# Patient Record
Sex: Male | Born: 1955 | Race: White | Hispanic: No | Smoking: Never smoker
Health system: Southern US, Community
[De-identification: ages and names within clinical notes are randomized; demographics above are authoritative.]

## PROBLEM LIST (undated history)

## (undated) DIAGNOSIS — E785 Hyperlipidemia, unspecified: Secondary | ICD-10-CM

## (undated) DIAGNOSIS — M1990 Unspecified osteoarthritis, unspecified site: Secondary | ICD-10-CM

## (undated) DIAGNOSIS — I639 Cerebral infarction, unspecified: Secondary | ICD-10-CM

## (undated) DIAGNOSIS — J449 Chronic obstructive pulmonary disease, unspecified: Secondary | ICD-10-CM

## (undated) DIAGNOSIS — D496 Neoplasm of unspecified behavior of brain: Secondary | ICD-10-CM

## (undated) DIAGNOSIS — F419 Anxiety disorder, unspecified: Secondary | ICD-10-CM

## (undated) DIAGNOSIS — E669 Obesity, unspecified: Secondary | ICD-10-CM

## (undated) DIAGNOSIS — R569 Unspecified convulsions: Secondary | ICD-10-CM

## (undated) DIAGNOSIS — Z5189 Encounter for other specified aftercare: Secondary | ICD-10-CM

## (undated) DIAGNOSIS — G4730 Sleep apnea, unspecified: Secondary | ICD-10-CM

## (undated) DIAGNOSIS — I2510 Atherosclerotic heart disease of native coronary artery without angina pectoris: Secondary | ICD-10-CM

## (undated) DIAGNOSIS — F32A Depression, unspecified: Secondary | ICD-10-CM

## (undated) HISTORY — DX: Cerebral infarction, unspecified: I63.9

## (undated) HISTORY — PX: BRAIN SURGERY: SHX531

## (undated) HISTORY — DX: Unspecified convulsions: R56.9

## (undated) HISTORY — DX: Hyperlipidemia, unspecified: E78.5

## (undated) HISTORY — DX: Depression, unspecified: F32.A

## (undated) HISTORY — DX: Chronic obstructive pulmonary disease, unspecified: J44.9

## (undated) HISTORY — DX: Encounter for other specified aftercare: Z51.89

## (undated) HISTORY — DX: Sleep apnea, unspecified: G47.30

## (undated) HISTORY — PX: SKIN LESION EXCISION: SHX2412

## (undated) HISTORY — PX: FOOT SURGERY: SHX648

## (undated) HISTORY — DX: Anxiety disorder, unspecified: F41.9

---

## 1998-03-17 ENCOUNTER — Emergency Department (HOSPITAL_COMMUNITY): Admission: EM | Admit: 1998-03-17 | Discharge: 1998-03-17 | Payer: Self-pay | Admitting: Emergency Medicine

## 1998-03-25 ENCOUNTER — Emergency Department (HOSPITAL_COMMUNITY): Admission: EM | Admit: 1998-03-25 | Discharge: 1998-03-25 | Payer: Self-pay | Admitting: Emergency Medicine

## 1998-05-28 ENCOUNTER — Emergency Department (HOSPITAL_COMMUNITY): Admission: EM | Admit: 1998-05-28 | Discharge: 1998-05-28 | Payer: Self-pay | Admitting: Emergency Medicine

## 1998-07-13 ENCOUNTER — Emergency Department (HOSPITAL_COMMUNITY): Admission: EM | Admit: 1998-07-13 | Discharge: 1998-07-13 | Payer: Self-pay | Admitting: Emergency Medicine

## 1998-07-29 ENCOUNTER — Emergency Department (HOSPITAL_COMMUNITY): Admission: EM | Admit: 1998-07-29 | Discharge: 1998-07-29 | Payer: Self-pay | Admitting: Emergency Medicine

## 1998-08-15 ENCOUNTER — Ambulatory Visit (HOSPITAL_COMMUNITY): Admission: RE | Admit: 1998-08-15 | Discharge: 1998-08-15 | Payer: Self-pay | Admitting: Internal Medicine

## 1998-08-15 ENCOUNTER — Encounter: Payer: Self-pay | Admitting: Internal Medicine

## 1998-08-28 ENCOUNTER — Ambulatory Visit (HOSPITAL_COMMUNITY): Admission: RE | Admit: 1998-08-28 | Discharge: 1998-08-28 | Payer: Self-pay | Admitting: Internal Medicine

## 1998-08-28 ENCOUNTER — Encounter: Payer: Self-pay | Admitting: Internal Medicine

## 1999-02-26 ENCOUNTER — Emergency Department (HOSPITAL_COMMUNITY): Admission: EM | Admit: 1999-02-26 | Discharge: 1999-02-26 | Payer: Self-pay

## 1999-11-26 ENCOUNTER — Emergency Department (HOSPITAL_COMMUNITY): Admission: EM | Admit: 1999-11-26 | Discharge: 1999-11-26 | Payer: Self-pay | Admitting: Emergency Medicine

## 1999-11-28 ENCOUNTER — Encounter: Payer: Self-pay | Admitting: General Surgery

## 1999-11-30 ENCOUNTER — Ambulatory Visit (HOSPITAL_COMMUNITY): Admission: RE | Admit: 1999-11-30 | Discharge: 1999-11-30 | Payer: Self-pay | Admitting: General Surgery

## 1999-12-17 ENCOUNTER — Emergency Department (HOSPITAL_COMMUNITY): Admission: EM | Admit: 1999-12-17 | Discharge: 1999-12-17 | Payer: Self-pay | Admitting: Emergency Medicine

## 1999-12-17 ENCOUNTER — Encounter: Payer: Self-pay | Admitting: Emergency Medicine

## 2000-01-12 ENCOUNTER — Emergency Department (HOSPITAL_COMMUNITY): Admission: EM | Admit: 2000-01-12 | Discharge: 2000-01-12 | Payer: Self-pay | Admitting: *Deleted

## 2000-01-12 ENCOUNTER — Encounter: Payer: Self-pay | Admitting: Emergency Medicine

## 2000-05-11 ENCOUNTER — Encounter: Payer: Self-pay | Admitting: Emergency Medicine

## 2000-05-11 ENCOUNTER — Emergency Department (HOSPITAL_COMMUNITY): Admission: EM | Admit: 2000-05-11 | Discharge: 2000-05-11 | Payer: Self-pay | Admitting: Emergency Medicine

## 2000-06-09 ENCOUNTER — Encounter: Payer: Self-pay | Admitting: Urology

## 2000-06-09 ENCOUNTER — Ambulatory Visit (HOSPITAL_COMMUNITY): Admission: RE | Admit: 2000-06-09 | Discharge: 2000-06-09 | Payer: Self-pay | Admitting: Urology

## 2000-07-14 ENCOUNTER — Emergency Department (HOSPITAL_COMMUNITY): Admission: EM | Admit: 2000-07-14 | Discharge: 2000-07-14 | Payer: Self-pay | Admitting: Emergency Medicine

## 2000-07-15 ENCOUNTER — Encounter: Payer: Self-pay | Admitting: Emergency Medicine

## 2000-12-29 ENCOUNTER — Emergency Department (HOSPITAL_COMMUNITY): Admission: EM | Admit: 2000-12-29 | Discharge: 2000-12-29 | Payer: Self-pay | Admitting: *Deleted

## 2002-02-24 ENCOUNTER — Emergency Department (HOSPITAL_COMMUNITY): Admission: EM | Admit: 2002-02-24 | Discharge: 2002-02-24 | Payer: Self-pay | Admitting: *Deleted

## 2003-04-27 ENCOUNTER — Emergency Department (HOSPITAL_COMMUNITY): Admission: EM | Admit: 2003-04-27 | Discharge: 2003-04-27 | Payer: Self-pay | Admitting: Emergency Medicine

## 2004-04-27 ENCOUNTER — Ambulatory Visit: Payer: Self-pay | Admitting: Family Medicine

## 2004-05-20 ENCOUNTER — Emergency Department (HOSPITAL_COMMUNITY): Admission: EM | Admit: 2004-05-20 | Discharge: 2004-05-20 | Payer: Self-pay | Admitting: Emergency Medicine

## 2004-05-27 ENCOUNTER — Emergency Department (HOSPITAL_COMMUNITY): Admission: EM | Admit: 2004-05-27 | Discharge: 2004-05-27 | Payer: Self-pay | Admitting: Emergency Medicine

## 2006-01-06 ENCOUNTER — Ambulatory Visit: Payer: Self-pay | Admitting: Nurse Practitioner

## 2006-01-07 ENCOUNTER — Ambulatory Visit (HOSPITAL_COMMUNITY): Admission: RE | Admit: 2006-01-07 | Discharge: 2006-01-07 | Payer: Self-pay | Admitting: Nurse Practitioner

## 2006-01-07 ENCOUNTER — Ambulatory Visit: Payer: Self-pay | Admitting: *Deleted

## 2006-01-22 ENCOUNTER — Ambulatory Visit: Payer: Self-pay | Admitting: Nurse Practitioner

## 2006-03-13 ENCOUNTER — Ambulatory Visit (HOSPITAL_COMMUNITY): Admission: RE | Admit: 2006-03-13 | Discharge: 2006-03-13 | Payer: Self-pay | Admitting: Nurse Practitioner

## 2006-03-26 ENCOUNTER — Ambulatory Visit: Payer: Self-pay | Admitting: Nurse Practitioner

## 2007-01-14 ENCOUNTER — Encounter (INDEPENDENT_AMBULATORY_CARE_PROVIDER_SITE_OTHER): Payer: Self-pay | Admitting: *Deleted

## 2007-10-07 ENCOUNTER — Encounter (INDEPENDENT_AMBULATORY_CARE_PROVIDER_SITE_OTHER): Payer: Self-pay | Admitting: Nurse Practitioner

## 2007-10-07 ENCOUNTER — Ambulatory Visit: Payer: Self-pay | Admitting: Internal Medicine

## 2007-10-07 LAB — CONVERTED CEMR LAB
ALT: 16 units/L (ref 0–53)
AST: 17 units/L (ref 0–37)
Albumin: 4.3 g/dL (ref 3.5–5.2)
Alkaline Phosphatase: 45 units/L (ref 39–117)
Basophils Absolute: 0 10*3/uL (ref 0.0–0.1)
Basophils Relative: 0 % (ref 0–1)
CO2: 22 meq/L (ref 19–32)
Calcium: 9.5 mg/dL (ref 8.4–10.5)
Chloride: 109 meq/L (ref 96–112)
Creatinine, Ser: 0.97 mg/dL (ref 0.40–1.50)
Eosinophils Absolute: 0.1 10*3/uL (ref 0.0–0.7)
Glucose, Bld: 117 mg/dL — ABNORMAL HIGH (ref 70–99)
Lymphocytes Relative: 29 % (ref 12–46)
Lymphs Abs: 2 10*3/uL (ref 0.7–4.0)
MCHC: 32.8 g/dL (ref 30.0–36.0)
Monocytes Relative: 7 % (ref 3–12)
Neutro Abs: 4.3 10*3/uL (ref 1.7–7.7)
Platelets: 242 10*3/uL (ref 150–400)
Potassium: 4.3 meq/L (ref 3.5–5.3)
RBC: 5.12 M/uL (ref 4.22–5.81)
RDW: 14.9 % (ref 11.5–15.5)
Sodium: 141 meq/L (ref 135–145)
TSH: 1.312 microintl units/mL (ref 0.350–5.50)
Total Bilirubin: 0.7 mg/dL (ref 0.3–1.2)
Total Protein: 6.9 g/dL (ref 6.0–8.3)
WBC: 6.8 10*3/uL (ref 4.0–10.5)

## 2007-10-26 ENCOUNTER — Ambulatory Visit: Payer: Self-pay | Admitting: Internal Medicine

## 2007-10-26 LAB — CONVERTED CEMR LAB
Cholesterol: 182 mg/dL (ref 0–200)
HDL: 50 mg/dL (ref >39)
LDL Cholesterol: 117 mg/dL — ABNORMAL HIGH (ref 0–99)
Total CHOL/HDL Ratio: 3.6
Triglycerides: 75 mg/dL (ref <150)
VLDL: 15 mg/dL (ref 0–40)

## 2008-08-23 ENCOUNTER — Emergency Department (HOSPITAL_COMMUNITY): Admission: EM | Admit: 2008-08-23 | Discharge: 2008-08-23 | Payer: Self-pay | Admitting: Emergency Medicine

## 2008-08-23 ENCOUNTER — Ambulatory Visit: Payer: Self-pay | Admitting: Internal Medicine

## 2009-09-13 ENCOUNTER — Emergency Department (HOSPITAL_COMMUNITY): Admission: EM | Admit: 2009-09-13 | Discharge: 2009-09-13 | Payer: Self-pay | Admitting: Emergency Medicine

## 2009-09-14 ENCOUNTER — Encounter (INDEPENDENT_AMBULATORY_CARE_PROVIDER_SITE_OTHER): Payer: Self-pay

## 2009-09-14 ENCOUNTER — Inpatient Hospital Stay (HOSPITAL_COMMUNITY): Admission: EM | Admit: 2009-09-14 | Discharge: 2009-09-18 | Payer: Self-pay | Admitting: Emergency Medicine

## 2010-03-19 ENCOUNTER — Ambulatory Visit: Payer: Self-pay | Admitting: Internal Medicine

## 2010-03-19 ENCOUNTER — Inpatient Hospital Stay (HOSPITAL_COMMUNITY): Admission: EM | Admit: 2010-03-19 | Discharge: 2010-03-23 | Payer: Self-pay | Admitting: Emergency Medicine

## 2010-05-20 ENCOUNTER — Emergency Department (HOSPITAL_COMMUNITY)
Admission: EM | Admit: 2010-05-20 | Discharge: 2010-05-20 | Payer: Self-pay | Source: Home / Self Care | Admitting: Emergency Medicine

## 2010-05-22 LAB — URINALYSIS, ROUTINE W REFLEX MICROSCOPIC
Bilirubin Urine: NEGATIVE
Ketones, ur: NEGATIVE mg/dL
Leukocytes, UA: NEGATIVE
Nitrite: NEGATIVE
Protein, ur: NEGATIVE mg/dL
Specific Gravity, Urine: 1.015 (ref 1.005–1.030)
Urine Glucose, Fasting: NEGATIVE mg/dL
Urobilinogen, UA: 0.2 mg/dL (ref 0.0–1.0)
pH: 6 (ref 5.0–8.0)

## 2010-05-22 LAB — COMPREHENSIVE METABOLIC PANEL
ALT: 11 U/L (ref 0–53)
AST: 18 U/L (ref 0–37)
Albumin: 3.3 g/dL — ABNORMAL LOW (ref 3.5–5.2)
Alkaline Phosphatase: 49 U/L (ref 39–117)
BUN: 26 mg/dL — ABNORMAL HIGH (ref 6–23)
CO2: 21 mEq/L (ref 19–32)
Calcium: 8.4 mg/dL (ref 8.4–10.5)
Chloride: 110 mEq/L (ref 96–112)
Creatinine, Ser: 1.67 mg/dL — ABNORMAL HIGH (ref 0.4–1.5)
GFR calc Af Amer: 52 mL/min — ABNORMAL LOW (ref >60)
GFR calc non Af Amer: 43 mL/min — ABNORMAL LOW (ref >60)
Glucose, Bld: 105 mg/dL — ABNORMAL HIGH (ref 70–99)
Potassium: 4.3 mEq/L (ref 3.5–5.1)
Sodium: 138 mEq/L (ref 135–145)
Total Bilirubin: 0.6 mg/dL (ref 0.3–1.2)
Total Protein: 6.2 g/dL (ref 6.0–8.3)

## 2010-05-22 LAB — POCT I-STAT, CHEM 8
BUN: 29 mg/dL — ABNORMAL HIGH (ref 6–23)
Calcium, Ion: 1.11 mmol/L — ABNORMAL LOW (ref 1.12–1.32)
Chloride: 110 mEq/L (ref 96–112)
Creatinine, Ser: 1.9 mg/dL — ABNORMAL HIGH (ref 0.4–1.5)
Glucose, Bld: 108 mg/dL — ABNORMAL HIGH (ref 70–99)
HCT: 32 % — ABNORMAL LOW (ref 39.0–52.0)
Hemoglobin: 10.9 g/dL — ABNORMAL LOW (ref 13.0–17.0)
Potassium: 4.4 mEq/L (ref 3.5–5.1)
Sodium: 140 mEq/L (ref 135–145)
TCO2: 22 mmol/L (ref 0–100)

## 2010-05-22 LAB — URINE MICROSCOPIC-ADD ON

## 2010-07-10 LAB — CBC
HCT: 32.5 % — ABNORMAL LOW (ref 39.0–52.0)
HCT: 33.7 % — ABNORMAL LOW (ref 39.0–52.0)
Hemoglobin: 11.3 g/dL — ABNORMAL LOW (ref 13.0–17.0)
Hemoglobin: 11.9 g/dL — ABNORMAL LOW (ref 13.0–17.0)
MCH: 29.9 pg (ref 26.0–34.0)
MCH: 30.1 pg (ref 26.0–34.0)
MCH: 30.2 pg (ref 26.0–34.0)
MCHC: 35.2 g/dL (ref 30.0–36.0)
MCV: 85.3 fL (ref 78.0–100.0)
MCV: 85.3 fL (ref 78.0–100.0)
MCV: 85.8 fL (ref 78.0–100.0)
MCV: 86.3 fL (ref 78.0–100.0)
Platelets: 266 10*3/uL (ref 150–400)
RBC: 3.75 MIL/uL — ABNORMAL LOW (ref 4.22–5.81)
RBC: 3.77 MIL/uL — ABNORMAL LOW (ref 4.22–5.81)
RBC: 3.81 MIL/uL — ABNORMAL LOW (ref 4.22–5.81)
RDW: 16.7 % — ABNORMAL HIGH (ref 11.5–15.5)
RDW: 17.4 % — ABNORMAL HIGH (ref 11.5–15.5)
WBC: 1.6 10*3/uL — ABNORMAL LOW (ref 4.0–10.5)
WBC: 2.5 10*3/uL — ABNORMAL LOW (ref 4.0–10.5)

## 2010-07-10 LAB — DIFFERENTIAL
Eosinophils Relative: 0 % (ref 0–5)
Lymphocytes Relative: 24 % (ref 12–46)
Lymphs Abs: 0.4 10*3/uL — ABNORMAL LOW (ref 0.7–4.0)
Monocytes Absolute: 0 10*3/uL — ABNORMAL LOW (ref 0.1–1.0)

## 2010-07-10 LAB — GLUCOSE, CAPILLARY
Glucose-Capillary: 110 mg/dL — ABNORMAL HIGH (ref 70–99)
Glucose-Capillary: 119 mg/dL — ABNORMAL HIGH (ref 70–99)
Glucose-Capillary: 127 mg/dL — ABNORMAL HIGH (ref 70–99)
Glucose-Capillary: 132 mg/dL — ABNORMAL HIGH (ref 70–99)
Glucose-Capillary: 134 mg/dL — ABNORMAL HIGH (ref 70–99)
Glucose-Capillary: 142 mg/dL — ABNORMAL HIGH (ref 70–99)
Glucose-Capillary: 143 mg/dL — ABNORMAL HIGH (ref 70–99)

## 2010-07-10 LAB — BASIC METABOLIC PANEL
CO2: 23 mEq/L (ref 19–32)
Chloride: 105 mEq/L (ref 96–112)
GFR calc Af Amer: >60 mL/min (ref >60)
Potassium: 3.9 mEq/L (ref 3.5–5.1)
Sodium: 137 mEq/L (ref 135–145)

## 2010-07-10 LAB — LACTIC ACID, PLASMA: Lactic Acid, Venous: 1.2 mmol/L (ref 0.5–2.2)

## 2010-07-10 LAB — CULTURE, BLOOD (ROUTINE X 2)
Culture  Setup Time: 201111211017
Culture: NO GROWTH

## 2010-07-10 LAB — POCT CARDIAC MARKERS
CKMB, poc: <1 ng/mL — ABNORMAL LOW (ref 1.0–8.0)
Troponin i, poc: <0.05 ng/mL (ref 0.00–0.09)

## 2010-07-10 LAB — RAPID STREP SCREEN (MED CTR MEBANE ONLY): Streptococcus, Group A Screen (Direct): NEGATIVE

## 2010-07-10 LAB — HIV ANTIBODY (ROUTINE TESTING W REFLEX): HIV: NONREACTIVE

## 2010-07-10 LAB — COMPREHENSIVE METABOLIC PANEL
Albumin: 3.2 g/dL — ABNORMAL LOW (ref 3.5–5.2)
BUN: 19 mg/dL (ref 6–23)
Chloride: 108 mEq/L (ref 96–112)
Creatinine, Ser: 1.03 mg/dL (ref 0.4–1.5)
GFR calc non Af Amer: >60 mL/min (ref >60)
Total Bilirubin: 0.6 mg/dL (ref 0.3–1.2)

## 2010-07-10 LAB — POCT I-STAT, CHEM 8
Calcium, Ion: 1.07 mmol/L — ABNORMAL LOW (ref 1.12–1.32)
Chloride: 109 mEq/L (ref 96–112)
HCT: 32 % — ABNORMAL LOW (ref 39.0–52.0)
Potassium: 3.8 mEq/L (ref 3.5–5.1)
Sodium: 139 mEq/L (ref 135–145)

## 2010-07-10 LAB — MRSA PCR SCREENING: MRSA by PCR: POSITIVE — AB

## 2010-07-16 LAB — BASIC METABOLIC PANEL
BUN: 16 mg/dL (ref 6–23)
BUN: 18 mg/dL (ref 6–23)
CO2: 25 mEq/L (ref 19–32)
Calcium: 8 mg/dL — ABNORMAL LOW (ref 8.4–10.5)
Chloride: 105 mEq/L (ref 96–112)
Chloride: 106 mEq/L (ref 96–112)
Creatinine, Ser: 1.02 mg/dL (ref 0.4–1.5)
GFR calc Af Amer: >60 mL/min (ref >60)
GFR calc non Af Amer: >60 mL/min (ref >60)
Potassium: 3.8 mEq/L (ref 3.5–5.1)
Sodium: 137 mEq/L (ref 135–145)

## 2010-07-16 LAB — CBC
HCT: 36.1 % — ABNORMAL LOW (ref 39.0–52.0)
Hemoglobin: 12.6 g/dL — ABNORMAL LOW (ref 13.0–17.0)
MCHC: 35.8 g/dL (ref 30.0–36.0)
MCV: 84.7 fL (ref 78.0–100.0)
MCV: 85.9 fL (ref 78.0–100.0)
Platelets: 327 10*3/uL (ref 150–400)
Platelets: 365 10*3/uL (ref 150–400)
RBC: 3.65 MIL/uL — ABNORMAL LOW (ref 4.22–5.81)
RBC: 4.21 MIL/uL — ABNORMAL LOW (ref 4.22–5.81)
WBC: 4.4 10*3/uL (ref 4.0–10.5)
WBC: 5.3 10*3/uL (ref 4.0–10.5)

## 2010-07-16 LAB — DIFFERENTIAL
Eosinophils Absolute: 0 10*3/uL (ref 0.0–0.7)
Eosinophils Relative: 1 % (ref 0–5)
Lymphocytes Relative: 21 % (ref 12–46)
Lymphs Abs: 1.1 10*3/uL (ref 0.7–4.0)
Monocytes Absolute: 0.1 10*3/uL (ref 0.1–1.0)
Monocytes Relative: 1 % — ABNORMAL LOW (ref 3–12)

## 2010-07-16 LAB — CULTURE, ROUTINE-ABSCESS

## 2010-07-16 LAB — GRAM STAIN

## 2010-07-16 LAB — CULTURE, BLOOD (ROUTINE X 2)

## 2010-07-16 LAB — ANAEROBIC CULTURE

## 2010-07-25 ENCOUNTER — Emergency Department (HOSPITAL_COMMUNITY)
Admission: EM | Admit: 2010-07-25 | Discharge: 2010-07-25 | Disposition: A | Discharge status: Home / Self Care | Payer: Medicaid Other | Attending: Emergency Medicine | Admitting: Emergency Medicine

## 2010-07-25 ENCOUNTER — Emergency Department (HOSPITAL_COMMUNITY): Payer: Medicaid Other

## 2010-07-25 DIAGNOSIS — M25539 Pain in unspecified wrist: Secondary | ICD-10-CM | POA: Insufficient documentation

## 2010-07-25 DIAGNOSIS — L989 Disorder of the skin and subcutaneous tissue, unspecified: Secondary | ICD-10-CM | POA: Insufficient documentation

## 2010-07-25 DIAGNOSIS — M25439 Effusion, unspecified wrist: Secondary | ICD-10-CM | POA: Insufficient documentation

## 2010-07-25 DIAGNOSIS — M129 Arthropathy, unspecified: Secondary | ICD-10-CM | POA: Insufficient documentation

## 2010-07-25 DIAGNOSIS — I10 Essential (primary) hypertension: Secondary | ICD-10-CM | POA: Insufficient documentation

## 2010-08-08 LAB — CBC
Hemoglobin: 14.4 g/dL (ref 13.0–17.0)
RBC: 4.53 MIL/uL (ref 4.22–5.81)
WBC: 3.2 10*3/uL — ABNORMAL LOW (ref 4.0–10.5)

## 2010-08-08 LAB — PROTIME-INR: INR: 0.9 (ref 0.00–1.49)

## 2010-08-08 LAB — DIFFERENTIAL
Lymphocytes Relative: 33 % (ref 12–46)
Lymphs Abs: 1.1 10*3/uL (ref 0.7–4.0)
Monocytes Absolute: 0 10*3/uL — ABNORMAL LOW (ref 0.1–1.0)
Monocytes Relative: 1 % — ABNORMAL LOW (ref 3–12)
Neutro Abs: 2 10*3/uL (ref 1.7–7.7)
Neutrophils Relative %: 62 % (ref 43–77)

## 2010-08-08 LAB — BASIC METABOLIC PANEL
CO2: 22 mEq/L (ref 19–32)
Calcium: 8.7 mg/dL (ref 8.4–10.5)
Creatinine, Ser: 0.89 mg/dL (ref 0.4–1.5)
GFR calc Af Amer: >60 mL/min (ref >60)
Sodium: 138 mEq/L (ref 135–145)

## 2010-08-08 LAB — POCT CARDIAC MARKERS
CKMB, poc: 1.5 ng/mL (ref 1.0–8.0)
Myoglobin, poc: 51.6 ng/mL (ref 12–200)
Troponin i, poc: <0.05 ng/mL (ref 0.00–0.09)
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
## 2010-09-14 NOTE — Op Note (Signed)
[facility]  Patient:    Edwin Snyder, Edwin Snyder                       MRN: [id number] Proc. Date: 11/30/99 Attending:  Adolph Pollack, M.D.                           Operative Report  PREOPERATIVE DIAGNOSIS:  Right inguinal hernia.  POSTOPERATIVE DIAGNOSIS:  _______ of right inguinal hernia.  PROCEDURE:  Right inguinal hernia repair with mesh and plug.  SURGEON:  Adolph Pollack, M.D.  ANESTHESIA:  General with 0.5% plain Marcaine for local.  COMPLICATIONS:  None apparent.  INDICATIONS:  This is a 55 year old male who has had acutely increase in a bulge in the right groin that is painful.  He has had to be off work and he is self-employed and owns his own business.  He now presents for urgent repair of the right inguinal hernia.  DESCRIPTION OF PROCEDURE:  He was placed supine on the operating table and a general anesthetic was administered.  His right groin area was sterilely prepped and draped.  A local anesthetic was infiltrated superficially and deep, and in an oblique fashion was made in the right groin, carried down to subcutaneous tissue, and down through Scarpas fascia with the cautery.  A local anesthetic was infiltrated deep to the external oblique aponeurosis which was then split in the direction of its fibers through the external ring medially and up toward the anterior and superior iliac spine laterally.  The ilioinguinal nerve was preserved.  The inferior edge of the external oblique aponeurosis was bluntly dissected down to the level of the shelving edge of the inguinal ligament inferiorly and to the internal oblique aponeurosis superiorly.  The spermatic cord was isolated.  There was what appeared to be a partial defect in the internal ring, extending into the direct ring, and this was adherent to the spermatic cord, and this was taken down bluntly.  This was able to be reduced back to the abdominal cavity.  Subsequently, a plug  was placed into this large defect and sewn to the under side of the internal oblique aponeurosis.  I then brought a piece of 3 inch by 6 inch polypropylene mesh and anchored it 1 cm medial to the pubic tubercle. The inferior edge of the mesh was anchored to the shelving edge of the inguinal ligament with a running 2-0 Prolene suture, up to the level of the internal ring.  Next, a tail was cut in the mesh and wrapped around the spermatic cord, and the superior aspect of the mesh was anchored to the internal oblique aponeurosis with interrupted 2-0 Vicryl sutures.  The two tails of the mesh were then crossed, creating a new internal ring, and the tails of the mesh were anchored to the shelving edge of the inguinal ligament with a single 2-0 Prolene suture.  The aperture allowed for the introduction of the hemostat.  Next, the area was examined and hemostasis was adequate.  The lateral portion of the mesh was tucked inferior to the external oblique aponeurosis which was closed with a running 3-0 Vicryl suture.  The Scarpas fascia was closed with a running 3-0 Vicryl suture, and the skin closed with a 4-0 Vicryl subcuticular stitch followed by Steri-Strips and a sterile dressing.  He tolerated the procedure well without any apparent complications.  The right testicle was in the scrotum in the  appropriate position at the end of the operation.  He was taken to the recovery room in satisfactory condition. DD:  11/30/99 TD:  12/03/99 Job: [id number]

## 2011-07-08 ENCOUNTER — Encounter (HOSPITAL_COMMUNITY): Payer: Self-pay | Admitting: *Deleted

## 2011-07-08 ENCOUNTER — Emergency Department (HOSPITAL_COMMUNITY)
Admission: EM | Admit: 2011-07-08 | Discharge: 2011-07-08 | Disposition: A | Discharge status: Home Health | Payer: Medicaid Other | Attending: Emergency Medicine | Admitting: Emergency Medicine

## 2011-07-08 DIAGNOSIS — K029 Dental caries, unspecified: Secondary | ICD-10-CM | POA: Insufficient documentation

## 2011-07-08 MED ORDER — HYDROCODONE-ACETAMINOPHEN 5-325 MG PO TABS
1.0000 | ORAL_TABLET | Freq: Once | ORAL | Status: AC
  Administered 2011-07-08: 1 via ORAL
  Filled 2011-07-08: qty 1

## 2011-07-08 MED ORDER — HYDROCODONE-ACETAMINOPHEN 5-325 MG PO TABS: 1.0000 | ORAL_TABLET | Freq: Four times a day (QID) | ORAL | Status: AC | PRN

## 2011-07-08 MED ORDER — ERYTHROMYCIN BASE 250 MG PO TABS: 250.0000 mg | ORAL_TABLET | Freq: Four times a day (QID) | ORAL | Status: AC

## 2011-07-08 NOTE — ED Notes (Signed)
Patient is AOx4 and comfortable with his discharge instructions.  The patient has someone present to give him a ride home.

## 2011-07-08 NOTE — ED Provider Notes (Signed)
History     CSN: [id number]  Arrival date & time 07/08/11  1552   First MD Initiated Contact with Patient 07/08/11 1945      Chief Complaint  Patient presents with  . Dental Pain    HPI Patient complains of dental pain. Patient states he's been having pain in both his upper and lower teeth. Recently he ate an apple into his lower central incisors broke. Patient went and saw a dentist today and has an appointment next week. He states he has nothing to take for pain. The pain increases with eating and chewing. The pain is described as moderate. He denies fevers cough difficulty swallowing or other complaints History reviewed. No pertinent past medical history.  History reviewed. No pertinent past surgical history.  History reviewed. No pertinent family history.  History  Substance Use Topics  . Smoking status: Never Smoker   . Smokeless tobacco: Not on file  . Alcohol Use: No      Review of Systems  All other systems reviewed and are negative.    Allergies  Clindamycin/lincomycin and Penicillins  Home Medications  No current outpatient prescriptions on file.  BP 122/82  Pulse 83  Temp(Src) 98.6 F (37 C) (Oral)  Resp 16  SpO2 99%  Physical Exam  Nursing note and vitals reviewed. Constitutional: He appears well-developed and well-nourished. No distress.  HENT:  Head: Normocephalic and atraumatic.  Right Ear: External ear normal.  Left Ear: External ear normal.       Widespread dental decay, numerous fractured teeth, no obvious  swelling  Eyes: Conjunctivae are normal. Right eye exhibits no discharge. Left eye exhibits no discharge. No scleral icterus.  Neck: Neck supple. No tracheal deviation present.  Cardiovascular: Normal rate.   Pulmonary/Chest: Effort normal. No stridor. No respiratory distress.  Musculoskeletal: He exhibits no edema.  Neurological: He is alert. Cranial nerve deficit: no gross deficits.  Skin: Skin is warm and dry. No rash noted.    Psychiatric: He has a normal mood and affect.    ED Course  Procedures (including critical care time)  Labs Reviewed - No data to display No results found.    MDM  Symptoms are consistent with a tooth infection. There are no obvious signs of a severe abscess. He has no significant facial swelling. At this time there does not appear to be any evidence of an emergency medical condition. I will refer him to a dentist/oral surgeon. Patient will be given prescriptions  for pain medications and antibiotics.          Celene Kras, MD 07/08/11 [phone]

## 2011-07-08 NOTE — ED Notes (Signed)
To ed for eval of upper and lower tooth pain. States his teeth have been breaking. States he has an appt with a dentist but not until next week.

## 2011-07-08 NOTE — Discharge Instructions (Signed)
Dental Caries Dental caries (cavities) are areas of tooth decay. Cavities are usually caused by a combination of poor dental care; sugar; tobacco, alcohol, and drug abuse; decreased saliva production; and receding gums. If cavities are not treated by a dentist, they grow in size. This can cause toothaches, infection, and loss of the tooth. Cavities of the outer tooth enamel do not cause symptoms. Dental pain from cold drinks may be the first sign the enamel has broken down and decay has spread toward the root of the tooth. This can cause the tooth to die or become infected. If a cavity is treated before it causes toothache, the tooth can usually be saved. Cavities can be prevented by good oral hygiene. Brushing your teeth in the morning and before bed, and using dental floss once daily helps remove plaque and reduce bacteria. Candy, soft drinks, and other sources of sugar promote tooth decay by promoting the growth of bacteria in the mouth. Proper diet, fluoride, dental cleaning, and fillings are important in preventing the loss of teeth from decay. Antibiotics, root canal treatment, or dental extraction may be needed if the decay is severe. Take any pain medication or antibiotics as directed by your caregiver. It is important that you follow up with a dentist for definitive care. SEEK MEDICAL CARE IF:   You or your child has an oral temperature above 102 F (38.9 C).   There is difficulty opening the mouth.   There is difficulty swallowing or handling secretions.   There is difficulty breathing.   There is chest pain.   There are worsening or concerning symptoms.  Document Released: 05/23/2004 Document Revised: 04/04/2011 Document Reviewed: 08/08/2009 [facility] Patient Information 2012 [place], [place].

## 2012-12-10 ENCOUNTER — Other Ambulatory Visit (HOSPITAL_COMMUNITY): Payer: Self-pay | Admitting: Specialist

## 2012-12-10 DIAGNOSIS — N189 Chronic kidney disease, unspecified: Secondary | ICD-10-CM

## 2012-12-10 DIAGNOSIS — R809 Proteinuria, unspecified: Secondary | ICD-10-CM

## 2012-12-17 ENCOUNTER — Ambulatory Visit (HOSPITAL_COMMUNITY)
Admission: RE | Admit: 2012-12-17 | Discharge: 2012-12-17 | Disposition: A | Discharge status: Home / Self Care | Payer: Medicaid Other | Source: Ambulatory Visit | Attending: Specialist | Admitting: Specialist

## 2012-12-17 DIAGNOSIS — R809 Proteinuria, unspecified: Secondary | ICD-10-CM | POA: Insufficient documentation

## 2012-12-17 DIAGNOSIS — N281 Cyst of kidney, acquired: Secondary | ICD-10-CM | POA: Insufficient documentation

## 2012-12-17 DIAGNOSIS — N189 Chronic kidney disease, unspecified: Secondary | ICD-10-CM | POA: Insufficient documentation

## 2013-01-14 ENCOUNTER — Emergency Department (HOSPITAL_COMMUNITY)
Admission: EM | Admit: 2013-01-14 | Discharge: 2013-01-14 | Disposition: A | Discharge status: Home / Self Care | Payer: Medicaid Other | Attending: Emergency Medicine | Admitting: Emergency Medicine

## 2013-01-14 ENCOUNTER — Encounter (HOSPITAL_COMMUNITY): Payer: Self-pay | Admitting: *Deleted

## 2013-01-14 ENCOUNTER — Emergency Department (HOSPITAL_COMMUNITY): Payer: Medicaid Other

## 2013-01-14 DIAGNOSIS — E669 Obesity, unspecified: Secondary | ICD-10-CM | POA: Insufficient documentation

## 2013-01-14 DIAGNOSIS — Z79899 Other long term (current) drug therapy: Secondary | ICD-10-CM | POA: Insufficient documentation

## 2013-01-14 DIAGNOSIS — Z880 Allergy status to penicillin: Secondary | ICD-10-CM | POA: Insufficient documentation

## 2013-01-14 DIAGNOSIS — R070 Pain in throat: Secondary | ICD-10-CM | POA: Insufficient documentation

## 2013-01-14 DIAGNOSIS — R0602 Shortness of breath: Secondary | ICD-10-CM | POA: Insufficient documentation

## 2013-01-14 DIAGNOSIS — M129 Arthropathy, unspecified: Secondary | ICD-10-CM | POA: Insufficient documentation

## 2013-01-14 HISTORY — DX: Obesity, unspecified: E66.9

## 2013-01-14 HISTORY — DX: Unspecified osteoarthritis, unspecified site: M19.90

## 2013-01-14 LAB — BASIC METABOLIC PANEL
Chloride: 101 mEq/L (ref 96–112)
Creatinine, Ser: 1.53 mg/dL — ABNORMAL HIGH (ref 0.50–1.35)
GFR calc Af Amer: 56 mL/min — ABNORMAL LOW (ref >90)
Potassium: 4.3 mEq/L (ref 3.5–5.1)
Sodium: 139 mEq/L (ref 135–145)

## 2013-01-14 LAB — PRO B NATRIURETIC PEPTIDE: Pro B Natriuretic peptide (BNP): 58.2 pg/mL (ref 0–125)

## 2013-01-14 LAB — POCT I-STAT TROPONIN I: Troponin i, poc: 0 ng/mL (ref 0.00–0.08)

## 2013-01-14 LAB — CBC
HCT: 32.7 % — ABNORMAL LOW (ref 39.0–52.0)
Hemoglobin: 10.5 g/dL — ABNORMAL LOW (ref 13.0–17.0)
MCH: 24.8 pg — ABNORMAL LOW (ref 26.0–34.0)
MCHC: 32.1 g/dL (ref 30.0–36.0)
MCV: 77.1 fL — ABNORMAL LOW (ref 78.0–100.0)
Platelets: 220 10*3/uL (ref 150–400)
RBC: 4.24 MIL/uL (ref 4.22–5.81)
RDW: 15.4 % (ref 11.5–15.5)
WBC: 7.9 10*3/uL (ref 4.0–10.5)

## 2013-01-14 MED ORDER — ONDANSETRON 4 MG PO TBDP: 4.0000 mg | ORAL_TABLET | Freq: Three times a day (TID) | ORAL | Status: DC | PRN

## 2013-01-14 MED ORDER — ONDANSETRON HCL 4 MG/2ML IJ SOLN: 4.0000 mg | Freq: Once | INTRAMUSCULAR | Status: DC

## 2013-01-14 MED ORDER — OXYCODONE-ACETAMINOPHEN 5-325 MG PO TABS
2.0000 | ORAL_TABLET | Freq: Once | ORAL | Status: AC
  Administered 2013-01-14: 2 via ORAL
  Filled 2013-01-14: qty 2

## 2013-01-14 MED ORDER — ONDANSETRON 4 MG PO TBDP
4.0000 mg | ORAL_TABLET | Freq: Once | ORAL | Status: AC
  Administered 2013-01-14: 4 mg via ORAL
  Filled 2013-01-14: qty 1

## 2013-01-14 NOTE — ED Notes (Signed)
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 Was sent home with one percocet that he took earlier. Later pt had onset of mid stabbing chest pains, sob, sore throat and feels like throat is swelling. Airway is intact at triage.
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
## 2013-01-15 NOTE — ED Provider Notes (Signed)
Medical screening examination/treatment/procedure(s) were performed by non-physician practitioner and as supervising physician I was immediately available for consultation/collaboration.  Kristen N Ward, DO 01/15/13 1458 

## 2013-03-18 ENCOUNTER — Emergency Department (HOSPITAL_COMMUNITY)
Admission: EM | Admit: 2013-03-18 | Discharge: 2013-03-19 | Disposition: A | Discharge status: Home / Self Care | Payer: Medicaid Other | Attending: Emergency Medicine | Admitting: Emergency Medicine

## 2013-03-18 ENCOUNTER — Encounter (HOSPITAL_COMMUNITY): Payer: Self-pay | Admitting: Emergency Medicine

## 2013-03-18 DIAGNOSIS — Z9889 Other specified postprocedural states: Secondary | ICD-10-CM | POA: Insufficient documentation

## 2013-03-18 DIAGNOSIS — M549 Dorsalgia, unspecified: Secondary | ICD-10-CM | POA: Insufficient documentation

## 2013-03-18 DIAGNOSIS — M130 Polyarthritis, unspecified: Secondary | ICD-10-CM | POA: Insufficient documentation

## 2013-03-18 DIAGNOSIS — K625 Hemorrhage of anus and rectum: Secondary | ICD-10-CM | POA: Insufficient documentation

## 2013-03-18 DIAGNOSIS — Z860100 Personal history of colon polyps, unspecified: Secondary | ICD-10-CM | POA: Insufficient documentation

## 2013-03-18 DIAGNOSIS — R109 Unspecified abdominal pain: Secondary | ICD-10-CM | POA: Insufficient documentation

## 2013-03-18 DIAGNOSIS — Z79899 Other long term (current) drug therapy: Secondary | ICD-10-CM | POA: Insufficient documentation

## 2013-03-18 DIAGNOSIS — G8929 Other chronic pain: Secondary | ICD-10-CM | POA: Insufficient documentation

## 2013-03-18 DIAGNOSIS — N289 Disorder of kidney and ureter, unspecified: Secondary | ICD-10-CM

## 2013-03-18 DIAGNOSIS — D649 Anemia, unspecified: Secondary | ICD-10-CM | POA: Insufficient documentation

## 2013-03-18 DIAGNOSIS — E669 Obesity, unspecified: Secondary | ICD-10-CM | POA: Insufficient documentation

## 2013-03-18 DIAGNOSIS — Z880 Allergy status to penicillin: Secondary | ICD-10-CM | POA: Insufficient documentation

## 2013-03-18 LAB — OCCULT BLOOD, POC DEVICE: Fecal Occult Bld: POSITIVE — AB

## 2013-03-18 NOTE — ED Notes (Signed)
Pt called his Dr & was advised to come to the ER. Pt states some blood on toilet paper. No profuse bleeding noted, pt states was just some when he wiped.

## 2013-03-18 NOTE — ED Notes (Signed)
Pt had a colonoscopy this morning in [facility], states they removed 2 polyps and repaired a hemorroid, states he started bleeding from his rectum approx 2 hours ago.

## 2013-03-18 NOTE — ED Provider Notes (Signed)
CSN: [id number]     Arrival date & time 03/18/13  2327 History  This chart was scribed for Dione Booze, MD by Bennett Scrape, ED Scribe. This patient was seen in room APA10/APA10 and the patient's care was started at 11:35 PM.   Chief Complaint  Patient presents with  . Rectal Bleeding    The history is provided by the patient. No language interpreter was used.    HPI Comments: Edwin Snyder is a 57 y.o. male who presents to the Emergency Department complaining of sudden onset, intermittent rectal bleeding described as bright red blood for the past two hours. Pt states that he had a colonoscopy done at [facility] by Dr. Noe Gens this morning. He had 2 polyps and 1 hemorrhoid removed without complication. He states that 2 hours ago he developed what felt like cramps that he associated with needing to have a BM. He reports that he strained but no stool came out. When he wiped, he noted a small amount of bright red blood on the tissue paper. He states that he wiped again PTA and saw a similar amount of bright red blood causing him concern. He reports a mild amount of groin pain but denies any dizziness, lightheadedness or abdominal pain as associated symptoms. He has a h/o arthritis but denies smoking and alcohol use.   Past Medical History  Diagnosis Date  . Arthritis   . Obesity    No past surgical history on file. No family history on file. History  Substance Use Topics  . Smoking status: Never Smoker   . Smokeless tobacco: Not on file  . Alcohol Use: No    Review of Systems  Gastrointestinal: Positive for anal bleeding. Negative for nausea, vomiting, abdominal pain and diarrhea.  Musculoskeletal: Positive for arthralgias (chronic hip pain) and back pain (chronic ).  Neurological: Negative for dizziness and light-headedness.  All other systems reviewed and are negative.    Allergies  Clindamycin/lincomycin and Penicillins  Home Medications   Current Outpatient  Rx  Name  Route  Sig  Dispense  Refill  . Dexlansoprazole (DEXILANT) 30 MG capsule   Oral   Take 30 mg by mouth daily.         Marland Kitchen HYDROcodone-acetaminophen (NORCO/VICODIN) 5-325 MG per tablet   Oral   Take 1 tablet by mouth 3 (three) times daily.         . ondansetron (ZOFRAN-ODT) 4 MG disintegrating tablet   Oral   Take 1 tablet (4 mg total) by mouth every 8 (eight) hours as needed for nausea.   10 tablet   0    Triage Vitals: BP 147/81  Pulse 92  Temp(Src) 98.3 F (36.8 C) (Oral)  Resp 20  Ht 6' (1.829 m)  Wt 332 lb (150.594 kg)  BMI 45.02 kg/m2  SpO2 98%  Physical Exam  Nursing note and vitals reviewed. Constitutional: He is oriented to person, place, and time. No distress.  Obese  HENT:  Head: Normocephalic and atraumatic.  Eyes: EOM are normal.  Neck: Neck supple. No tracheal deviation present.  Cardiovascular: Normal rate and regular rhythm.   Pulmonary/Chest: Effort normal and breath sounds normal. No respiratory distress.  Abdominal: Soft. There is no tenderness.  Genitourinary: Guaiac positive stool.  Rectal: Indurated area in the rectal vault consistent with internal hemorrhoid, small amount of bright red blood present, decreased sphincter tone  Musculoskeletal: Normal range of motion.  Neurological: He is alert and oriented to person, place, and time.  Skin: Skin is warm and dry.  Psychiatric: He has a normal mood and affect. His behavior is normal.    ED Course  Procedures (including critical care time)  DIAGNOSTIC STUDIES: Oxygen Saturation is 98% on room air, normal by my interpretation.    COORDINATION OF CARE: 11:38 PM-Discussed treatment plan which includes CBC panel, BMP and ortho vitals with pt at bedside and pt agreed to plan.   Labs Review Results for orders placed during the hospital encounter of 03/18/13  CBC WITH DIFFERENTIAL      Result Value Range   WBC 6.4  4.0 - 10.5 K/uL   RBC 4.20 (*) 4.22 - 5.81 MIL/uL   Hemoglobin 10.3  (*) 13.0 - 17.0 g/dL   HCT 16.1 (*) 09.6 - 04.5 %   MCV 78.1  78.0 - 100.0 fL   MCH 24.5 (*) 26.0 - 34.0 pg   MCHC 31.4  30.0 - 36.0 g/dL   RDW 40.9  81.1 - 91.4 %   Platelets 230  150 - 400 K/uL   Neutrophils Relative % 59  43 - 77 %   Neutro Abs 3.7  1.7 - 7.7 K/uL   Lymphocytes Relative 29  12 - 46 %   Lymphs Abs 1.9  0.7 - 4.0 K/uL   Monocytes Relative 8  3 - 12 %   Monocytes Absolute 0.5  0.1 - 1.0 K/uL   Eosinophils Relative 3  0 - 5 %   Eosinophils Absolute 0.2  0.0 - 0.7 K/uL   Basophils Relative 1  0 - 1 %   Basophils Absolute 0.1  0.0 - 0.1 K/uL  BASIC METABOLIC PANEL      Result Value Range   Sodium 137  135 - 145 mEq/L   Potassium 3.8  3.5 - 5.1 mEq/L   Chloride 103  96 - 112 mEq/L   CO2 22  19 - 32 mEq/L   Glucose, Bld 117 (*) 70 - 99 mg/dL   BUN 32 (*) 6 - 23 mg/dL   Creatinine, Ser 7.82 (*) 0.50 - 1.35 mg/dL   Calcium 9.1  8.4 - 95.6 mg/dL   GFR calc non Af Amer 45 (*) >90 mL/min   GFR calc Af Amer 52 (*) >90 mL/min  LACTIC ACID, PLASMA      Result Value Range   Lactic Acid, Venous 1.3  0.5 - 2.2 mmol/L  OCCULT BLOOD, POC DEVICE      Result Value Range   Fecal Occult Bld POSITIVE (*) NEGATIVE   MDM   1. Rectal bleeding   2. Anemia   3. Renal insufficiency    Rectal bleeding post colonoscopy with polyp and hemorrhoid resection. This does not appear to be a significant bleed as there is only a small amount of blood present on rectal exam, blood pressure is normal and heart rate is normal. Will check hemoglobin and orthostatic vital signs.  Hemoglobin is unchanged from baseline. Renal insufficiency is present which is also unchanged from baseline. With a static vital signs show no abnormal drop in blood pressure or rise in pulse. He has had no further bleeding while in the ED and is discharged with reassurance. He is to followup with his gastroenterologist and return if bleeding gets worse.  I personally performed the services described in this documentation,  which was scribed in my presence. The recorded information has been reviewed and is accurate.     Dione Booze, MD 03/19/13 [phone]

## 2013-03-19 LAB — CBC WITH DIFFERENTIAL/PLATELET
Basophils Absolute: 0.1 10*3/uL (ref 0.0–0.1)
Eosinophils Absolute: 0.2 10*3/uL (ref 0.0–0.7)
Eosinophils Relative: 3 % (ref 0–5)
Lymphocytes Relative: 29 % (ref 12–46)
MCH: 24.5 pg — ABNORMAL LOW (ref 26.0–34.0)
MCV: 78.1 fL (ref 78.0–100.0)
Neutrophils Relative %: 59 % (ref 43–77)
Platelets: 230 10*3/uL (ref 150–400)
RDW: 15.2 % (ref 11.5–15.5)
WBC: 6.4 10*3/uL (ref 4.0–10.5)

## 2013-03-19 LAB — LACTIC ACID, PLASMA: Lactic Acid, Venous: 1.3 mmol/L (ref 0.5–2.2)

## 2013-03-19 LAB — BASIC METABOLIC PANEL
Calcium: 9.1 mg/dL (ref 8.4–10.5)
GFR calc non Af Amer: 45 mL/min — ABNORMAL LOW (ref >90)
Sodium: 137 mEq/L (ref 135–145)

## 2013-03-19 NOTE — ED Notes (Signed)
Pt alert & oriented x4, stable gait. Patient given discharge instructions, paperwork & prescription(s). Patient  instructed to stop at the registration desk to finish any additional paperwork. Patient verbalized understanding. Pt left department w/ no further questions. 

## 2013-03-19 NOTE — ED Notes (Signed)
Family at bedside. Patient was returning from restroom when entering the room to get vitals. Patient uses cane for assistance.

## 2013-05-10 ENCOUNTER — Encounter (HOSPITAL_COMMUNITY): Payer: Self-pay | Admitting: Emergency Medicine

## 2013-05-10 ENCOUNTER — Emergency Department (HOSPITAL_COMMUNITY): Payer: Medicaid Other

## 2013-05-10 ENCOUNTER — Emergency Department (HOSPITAL_COMMUNITY)
Admission: EM | Admit: 2013-05-10 | Discharge: 2013-05-10 | Disposition: A | Discharge status: Home / Self Care | Payer: Medicaid Other | Attending: Emergency Medicine | Admitting: Emergency Medicine

## 2013-05-10 DIAGNOSIS — Y929 Unspecified place or not applicable: Secondary | ICD-10-CM | POA: Insufficient documentation

## 2013-05-10 DIAGNOSIS — IMO0002 Reserved for concepts with insufficient information to code with codable children: Secondary | ICD-10-CM

## 2013-05-10 DIAGNOSIS — Y9389 Activity, other specified: Secondary | ICD-10-CM | POA: Insufficient documentation

## 2013-05-10 DIAGNOSIS — Z880 Allergy status to penicillin: Secondary | ICD-10-CM | POA: Insufficient documentation

## 2013-05-10 DIAGNOSIS — E669 Obesity, unspecified: Secondary | ICD-10-CM | POA: Insufficient documentation

## 2013-05-10 DIAGNOSIS — M25571 Pain in right ankle and joints of right foot: Secondary | ICD-10-CM

## 2013-05-10 DIAGNOSIS — S99929A Unspecified injury of unspecified foot, initial encounter: Principal | ICD-10-CM

## 2013-05-10 DIAGNOSIS — S8990XA Unspecified injury of unspecified lower leg, initial encounter: Secondary | ICD-10-CM | POA: Insufficient documentation

## 2013-05-10 DIAGNOSIS — S99919A Unspecified injury of unspecified ankle, initial encounter: Principal | ICD-10-CM

## 2013-05-10 DIAGNOSIS — M19079 Primary osteoarthritis, unspecified ankle and foot: Secondary | ICD-10-CM | POA: Insufficient documentation

## 2013-05-10 DIAGNOSIS — M1710 Unilateral primary osteoarthritis, unspecified knee: Secondary | ICD-10-CM | POA: Insufficient documentation

## 2013-05-10 MED ORDER — HYDROCODONE-ACETAMINOPHEN 5-325 MG PO TABS: 1.0000 | ORAL_TABLET | ORAL | Status: DC | PRN

## 2013-05-10 MED ORDER — HYDROCODONE-ACETAMINOPHEN 5-325 MG PO TABS
1.0000 | ORAL_TABLET | Freq: Once | ORAL | Status: AC
  Administered 2013-05-10: 1 via ORAL
  Filled 2013-05-10: qty 1

## 2013-05-10 NOTE — Discharge Instructions (Signed)
Use crutches and RICE method for symptom control (see below) Take Vicodin - Please be careful with this medication.  It can cause drowsiness.  Use caution while driving, operating machinery, drinking alcohol, or any other activities that may impair your physical or mental abilities.   Return to the emergency department if you develop any changing/worsening condition, weakness, loss of sensation, spreading redness/swelling, blue/white toes, fever or any other concerns (please read additional information regarding your condition below)     Ankle Pain Ankle pain is a common symptom. The bones, cartilage, tendons, and muscles of the ankle joint perform a lot of work each day. The ankle joint holds your body weight and allows you to move around. Ankle pain can occur on either side or back of 1 or both ankles. Ankle pain may be sharp and burning or dull and aching. There may be tenderness, stiffness, redness, or warmth around the ankle. The pain occurs more often when a person walks or puts pressure on the ankle. CAUSES  There are many reasons ankle pain can develop. It is important to work with your caregiver to identify the cause since many conditions can impact the bones, cartilage, muscles, and tendons. Causes for ankle pain include:  Injury, including a break (fracture), sprain, or strain often due to a fall, sports, or a high-impact activity.  Swelling (inflammation) of a tendon (tendonitis).  Achilles tendon rupture.  Ankle instability after repeated sprains and strains.  Poor foot alignment.  Pressure on a nerve (tarsal tunnel syndrome).  Arthritis in the ankle or the lining of the ankle.  Crystal formation in the ankle (gout or pseudogout). DIAGNOSIS  A diagnosis is based on your medical history, your symptoms, results of your physical exam, and results of diagnostic tests. Diagnostic tests may include X-ray exams or a computerized magnetic scan (magnetic resonance imaging,  MRI). TREATMENT  Treatment will depend on the cause of your ankle pain and may include:  Keeping pressure off the ankle and limiting activities.  Using crutches or other walking support (a cane or brace).  Using rest, ice, compression, and elevation.  Participating in physical therapy or home exercises.  Wearing shoe inserts or special shoes.  Losing weight.  Taking medications to reduce pain or swelling or receiving an injection.  Undergoing surgery. HOME CARE INSTRUCTIONS   Only take over-the-counter or prescription medicines for pain, discomfort, or fever as directed by your caregiver.  Put ice on the injured area.  Put ice in a plastic bag.  Place a towel between your skin and the bag.  Leave the ice on for 15-20 minutes at a time, 03-04 times a day.  Keep your leg raised (elevated) when possible to lessen swelling.  Avoid activities that cause ankle pain.  Follow specific exercises as directed by your caregiver.  Record how often you have ankle pain, the location of the pain, and what it feels like. This information may be helpful to you and your caregiver.  Ask your caregiver about returning to work or sports and whether you should drive.  Follow up with your caregiver for further examination, therapy, or testing as directed. SEEK MEDICAL CARE IF:   Pain or swelling continues or worsens beyond 1 week.  You have an oral temperature above 102 F (38.9 C).  You are feeling unwell or have chills.  You are having an increasingly difficult time with walking.  You have loss of sensation or other new symptoms.  You have questions or concerns. MAKE SURE YOU:  Understand these instructions.  Will watch your condition.  Will get help right away if you are not doing well or get worse. Document Released: 10/03/2009 Document Revised: 07/08/2011 Document Reviewed: 10/03/2009 [facility] Patient Information 2014 [place].  RICE: Routine Care for  Injuries The routine care of many injuries includes Rest, Ice, Compression, and Elevation (RICE). HOME CARE INSTRUCTIONS  Rest is needed to allow your body to heal. Routine activities can usually be resumed when comfortable. Injured tendons and bones can take up to 6 weeks to heal. Tendons are the cord-like structures that attach muscle to bone.  Ice following an injury helps keep the swelling down and reduces pain.  Put ice in a plastic bag.  Place a towel between your skin and the bag.  Leave the ice on for 15-20 minutes, 03-04 times a day. Do this while awake, for the first 24 to 48 hours. After that, continue as directed by your caregiver.  Compression helps keep swelling down. It also gives support and helps with discomfort. If an elastic bandage has been applied, it should be removed and reapplied every 3 to 4 hours. It should not be applied tightly, but firmly enough to keep swelling down. Watch fingers or toes for swelling, bluish discoloration, coldness, numbness, or excessive pain. If any of these problems occur, remove the bandage and reapply loosely. Contact your caregiver if these problems continue.  Elevation helps reduce swelling and decreases pain. With extremities, such as the arms, hands, legs, and feet, the injured area should be placed near or above the level of the heart, if possible. SEEK IMMEDIATE MEDICAL CARE IF:  You have persistent pain and swelling.  You develop redness, numbness, or unexpected weakness.  Your symptoms are getting worse rather than improving after several days. These symptoms may indicate that further evaluation or further X-rays are needed. Sometimes, X-rays may not show a small broken bone (fracture) until 1 week or 10 days later. Make a follow-up appointment with your caregiver. Ask when your X-ray results will be ready. Make sure you get your X-ray results. Document Released: 07/28/2000 Document Revised: 07/08/2011 Document Reviewed:  09/14/2010 [facility] Patient Information 2014 [place], [place].

## 2013-05-10 NOTE — ED Provider Notes (Signed)
CSN: [id number]     Arrival date & time 05/10/13  1959 History   First MD Initiated Contact with Patient 05/10/13 2044     Chief Complaint  Patient presents with  . Fall  . Ankle Injury     HPI  Edwin Snyder is a 58 y.o. male with a PMH of arthritis and obesity who presents to the ED for evaluation of fall and ankle injury.  History was provided by the patient. Patient states he was getting out of his car a few hours PTA when he stepped down and slipped on the wet ground and twisted his right ankle.  Patient now has pain on the inside of his right ankle.  Pain is worse with movement.  Patient did not take anything for pain PTA.  He denies any other injuries.  No head injury.  No syncope.  He denies any knee or hip pain.  He uses a cane at baseline due to arthritis in his knees.  He denies any weakness, loss of sensation, numbness/tingling.  He has chronic arthritis in his ankles, feet and knees bilaterally.  No hx of surgery or trauma to the right ankle in the past.  Patient takes Norco and Percocet for pain.  Cannot take Ibuprofen or other NSAIDS due to his kidneys.     Past Medical History  Diagnosis Date  . Arthritis   . Obesity    Past Surgical History  Procedure Laterality Date  . Skin lesion excision     No family history on file. History  Substance Use Topics  . Smoking status: Never Smoker   . Smokeless tobacco: Not on file  . Alcohol Use: No    Review of Systems  Cardiovascular: Negative for leg swelling.  Musculoskeletal: Positive for arthralgias, gait problem (due to pain) and joint swelling. Negative for back pain, myalgias and neck pain.  Skin: Negative for wound.  Neurological: Negative for syncope, weakness, numbness and headaches.    Allergies  Clindamycin/lincomycin and Penicillins  Home Medications  No current outpatient prescriptions on file. BP 172/94  Pulse 86  Temp(Src) 98.4 F (36.9 C) (Oral)  Resp 20  Ht 6' (1.829 m)  Wt 340 lb (154.223 kg)   BMI 46.10 kg/m2  SpO2 96%  Filed Vitals:   05/10/13 2021 05/10/13 2233  BP: 172/94 144/77  Pulse: 86 88  Temp: 98.4 F (36.9 C)   TempSrc: Oral   Resp: 20   Height: 6' (1.829 m)   Weight: 340 lb (154.223 kg)   SpO2: 96% 96%    Physical Exam  Nursing note and vitals reviewed. Constitutional: He is oriented to person, place, and time. He appears well-developed and well-nourished. No distress.  HENT:  Head: Normocephalic and atraumatic.  Right Ear: External ear normal.  Left Ear: External ear normal.  Nose: Nose normal.  Mouth/Throat: Oropharynx is clear and moist.  Eyes: Conjunctivae are normal. Right eye exhibits no discharge. Left eye exhibits no discharge.  Neck: Normal range of motion. Neck supple.  Cardiovascular: Normal rate, regular rhythm, normal heart sounds and intact distal pulses.  Exam reveals no gallop and no friction rub.   No murmur heard. Dorsalis pedis pulses present and equal bilaterally  Pulmonary/Chest: Effort normal and breath sounds normal. No respiratory distress. He has no wheezes. He has no rales. He exhibits no tenderness.  Abdominal: Soft. He exhibits no distension. There is no tenderness.  Musculoskeletal: Normal range of motion. He exhibits edema and tenderness.  Feet:  Tenderness to palpation to the right medial malleolus and medial ankle diffusely with mild edema.  No posterior, anterior, or lateral ankle tenderness.  Achilles without palpable gap.  No ecchymosis or wounds.  No limitations with right ankle ROM.  No tenderness to palpation to the right foot, calf, knee, or hip.  Patient able to flex and extend knees and hips without difficulty. Strength 5/5 in the lower extremities bilaterally.    Neurological: He is alert and oriented to person, place, and time.  Sensation intact  Skin: Skin is warm and dry. He is not diaphoretic.    ED Course  Procedures (including critical care time) Labs Review Labs Reviewed - No data to  display Imaging Review Dg Ankle Complete Right  05/10/2013   CLINICAL DATA:  Injury  EXAM: RIGHT ANKLE - COMPLETE 3+ VIEW  COMPARISON:  05/20/2010  FINDINGS: No acute fracture. No dislocation. Pes planus deformity. Small spur at the inferior calcaneus. Degenerative changes in the midfoot. Diffuse soft tissue swelling about the ankle.  IMPRESSION: No acute bony pathology.  Chronic changes.   Electronically Signed   By: Maryclare Bean M.D.   On: 05/10/2013 20:37    EKG Interpretation   None      DG Ankle Complete Right (Final result)  Result time: 05/10/13 20:37:37    Final result by Rad Results In Interface (05/10/13 20:37:37)    Narrative:   CLINICAL DATA: Injury  EXAM: RIGHT ANKLE - COMPLETE 3+ VIEW  COMPARISON: 05/20/2010  FINDINGS: No acute fracture. No dislocation. Pes planus deformity. Small spur at the inferior calcaneus. Degenerative changes in the midfoot. Diffuse soft tissue swelling about the ankle.  IMPRESSION: No acute bony pathology. Chronic changes.   Electronically Signed By: Maryclare Bean M.D. On: 05/10/2013 20:37         MDM   Edwin Snyder is a 58 y.o. male with a PMH of arthritis and obesity who presents to the ED for evaluation of fall and ankle injury.  Etiology of ankle pain possibly due to sprain vs strain vs exacerbation of chronic arthritis.  Patient denied any other injuries including head injury/LOC. X-rays of right ankle negative for fx or malalignment.  Patient neurovascularly intact.  Patient given ankle Velcro splint and crutches as well as a prescription for Norco.  Patient states he cannot take NSAIDS due to kidney concerns.  Patient instructed to follow-up with his PCP and has a follow-up appointment already scheduled in 1 week.  Return precautions, discharge instructions, and follow-up was discussed with the patient before discharge.  Patient being driven home by his daughter who is also in the ED.         Discharge Medication List as of  05/10/2013 10:27 PM    START taking these medications   Details  HYDROcodone-acetaminophen (NORCO/VICODIN) 5-325 MG per tablet Take 1-2 tablets by mouth every 4 (four) hours as needed., Starting 05/10/2013, Until Discontinued, Print        Final impressions: 1. Right ankle pain       Harold Hedge [place], [place] 05/11/13 1314

## 2013-05-10 NOTE — ED Notes (Signed)
Rt ankle swelling /pain, good dp pulse and distal sensation.  Pain mostly at medial ankle. Pt usually uses a cane due to arthritis.  Ice Pack applied.

## 2013-05-10 NOTE — ED Notes (Signed)
Pt states he slipped getting out of his vehicle this evening, fell to the ground and twisted his right ankle

## 2013-05-11 NOTE — ED Provider Notes (Signed)
Medical screening examination/treatment/procedure(s) were performed by non-physician practitioner and as supervising physician I was immediately available for consultation/collaboration.  EKG Interpretation   None        Richarda Blade, MD 05/11/13 1704

## 2014-01-26 ENCOUNTER — Encounter (HOSPITAL_COMMUNITY): Payer: Self-pay | Admitting: Emergency Medicine

## 2014-01-26 ENCOUNTER — Emergency Department (HOSPITAL_COMMUNITY)
Admission: EM | Admit: 2014-01-26 | Discharge: 2014-01-26 | Disposition: A | Discharge status: Home / Self Care | Payer: Medicaid Other | Attending: Emergency Medicine | Admitting: Emergency Medicine

## 2014-01-26 ENCOUNTER — Emergency Department (HOSPITAL_COMMUNITY): Payer: Medicaid Other

## 2014-01-26 DIAGNOSIS — R112 Nausea with vomiting, unspecified: Secondary | ICD-10-CM | POA: Diagnosis not present

## 2014-01-26 DIAGNOSIS — H539 Unspecified visual disturbance: Secondary | ICD-10-CM | POA: Diagnosis not present

## 2014-01-26 DIAGNOSIS — M129 Arthropathy, unspecified: Secondary | ICD-10-CM | POA: Insufficient documentation

## 2014-01-26 DIAGNOSIS — Z79899 Other long term (current) drug therapy: Secondary | ICD-10-CM | POA: Insufficient documentation

## 2014-01-26 DIAGNOSIS — R519 Headache, unspecified: Secondary | ICD-10-CM

## 2014-01-26 DIAGNOSIS — Z86011 Personal history of benign neoplasm of the brain: Secondary | ICD-10-CM | POA: Diagnosis not present

## 2014-01-26 DIAGNOSIS — E669 Obesity, unspecified: Secondary | ICD-10-CM | POA: Diagnosis not present

## 2014-01-26 DIAGNOSIS — R51 Headache: Secondary | ICD-10-CM | POA: Insufficient documentation

## 2014-01-26 DIAGNOSIS — Z880 Allergy status to penicillin: Secondary | ICD-10-CM | POA: Diagnosis not present

## 2014-01-26 DIAGNOSIS — R404 Transient alteration of awareness: Secondary | ICD-10-CM | POA: Diagnosis present

## 2014-01-26 DIAGNOSIS — R569 Unspecified convulsions: Secondary | ICD-10-CM | POA: Insufficient documentation

## 2014-01-26 DIAGNOSIS — R109 Unspecified abdominal pain: Secondary | ICD-10-CM | POA: Insufficient documentation

## 2014-01-26 HISTORY — DX: Neoplasm of unspecified behavior of brain: D49.6

## 2014-01-26 LAB — CBC WITH DIFFERENTIAL/PLATELET
Basophils Absolute: 0 10*3/uL (ref 0.0–0.1)
Basophils Relative: 0 % (ref 0–1)
Eosinophils Absolute: 0 10*3/uL (ref 0.0–0.7)
Eosinophils Relative: 0 % (ref 0–5)
HEMATOCRIT: 46.1 % (ref 39.0–52.0)
Hemoglobin: 15.5 g/dL (ref 13.0–17.0)
LYMPHS ABS: 1.7 10*3/uL (ref 0.7–4.0)
LYMPHS PCT: 19 % (ref 12–46)
MCH: 29 pg (ref 26.0–34.0)
MCHC: 33.6 g/dL (ref 30.0–36.0)
MCV: 86.2 fL (ref 78.0–100.0)
Monocytes Absolute: 0.7 10*3/uL (ref 0.1–1.0)
Monocytes Relative: 8 % (ref 3–12)
NEUTROS ABS: 6.6 10*3/uL (ref 1.7–7.7)
Neutrophils Relative %: 73 % (ref 43–77)
PLATELETS: 244 10*3/uL (ref 150–400)
RBC: 5.35 MIL/uL (ref 4.22–5.81)
RDW: 13.9 % (ref 11.5–15.5)
WBC: 9.1 10*3/uL (ref 4.0–10.5)

## 2014-01-26 LAB — URINALYSIS, ROUTINE W REFLEX MICROSCOPIC
BILIRUBIN URINE: NEGATIVE
Glucose, UA: NEGATIVE mg/dL
KETONES UR: NEGATIVE mg/dL
LEUKOCYTES UA: NEGATIVE
Nitrite: NEGATIVE
PH: 5.5 (ref 5.0–8.0)
Protein, ur: 100 mg/dL — AB
Specific Gravity, Urine: 1.02 (ref 1.005–1.030)
Urobilinogen, UA: 0.2 mg/dL (ref 0.0–1.0)

## 2014-01-26 LAB — COMPREHENSIVE METABOLIC PANEL
ALK PHOS: 78 U/L (ref 39–117)
ALT: 11 U/L (ref 0–53)
AST: 14 U/L (ref 0–37)
Albumin: 3.4 g/dL — ABNORMAL LOW (ref 3.5–5.2)
Anion gap: 15 (ref 5–15)
BILIRUBIN TOTAL: 0.6 mg/dL (ref 0.3–1.2)
BUN: 25 mg/dL — ABNORMAL HIGH (ref 6–23)
CHLORIDE: 98 meq/L (ref 96–112)
CO2: 22 meq/L (ref 19–32)
Calcium: 9.1 mg/dL (ref 8.4–10.5)
Creatinine, Ser: 1.19 mg/dL (ref 0.50–1.35)
GFR calc Af Amer: 75 mL/min — ABNORMAL LOW (ref >90)
GFR, EST NON AFRICAN AMERICAN: 65 mL/min — AB (ref >90)
Glucose, Bld: 102 mg/dL — ABNORMAL HIGH (ref 70–99)
Potassium: 4.3 mEq/L (ref 3.7–5.3)
SODIUM: 135 meq/L — AB (ref 137–147)
Total Protein: 7.3 g/dL (ref 6.0–8.3)

## 2014-01-26 LAB — URINE MICROSCOPIC-ADD ON

## 2014-01-26 LAB — RAPID URINE DRUG SCREEN, HOSP PERFORMED
AMPHETAMINES: NOT DETECTED
BARBITURATES: POSITIVE — AB
BENZODIAZEPINES: NOT DETECTED
Cocaine: NOT DETECTED
Opiates: POSITIVE — AB
Tetrahydrocannabinol: NOT DETECTED

## 2014-01-26 LAB — ETHANOL: Alcohol, Ethyl (B): <11 mg/dL (ref 0–11)

## 2014-01-26 LAB — TROPONIN I: Troponin I: <0.3 ng/mL (ref <0.30)

## 2014-01-26 MED ORDER — MORPHINE SULFATE 4 MG/ML IJ SOLN
4.0000 mg | INTRAMUSCULAR | Status: DC | PRN
  Administered 2014-01-26 (×2): 4 mg via INTRAVENOUS
  Filled 2014-01-26 (×2): qty 1

## 2014-01-26 MED ORDER — ACETAMINOPHEN 325 MG PO TABS
650.0000 mg | ORAL_TABLET | Freq: Once | ORAL | Status: AC
  Administered 2014-01-26: 650 mg via ORAL
  Filled 2014-01-26: qty 2

## 2014-01-26 MED ORDER — IOHEXOL 300 MG/ML  SOLN
100.0000 mL | Freq: Once | INTRAMUSCULAR | Status: AC | PRN
  Administered 2014-01-26: 100 mL via INTRAVENOUS

## 2014-01-26 MED ORDER — OXYCODONE-ACETAMINOPHEN 5-325 MG PO TABS: 2.0000 | ORAL_TABLET | ORAL | Status: DC | PRN

## 2014-01-26 MED ORDER — LEVETIRACETAM 500 MG PO TABS: 500.0000 mg | ORAL_TABLET | Freq: Two times a day (BID) | ORAL | Status: DC

## 2014-01-26 MED ORDER — FENTANYL CITRATE 0.05 MG/ML IJ SOLN
100.0000 ug | INTRAMUSCULAR | Status: DC | PRN
  Administered 2014-01-26: 100 ug via INTRAVENOUS
  Filled 2014-01-26: qty 2

## 2014-01-26 MED ORDER — LEVETIRACETAM IN NACL 1000 MG/100ML IV SOLN
1000.0000 mg | Freq: Once | INTRAVENOUS | Status: AC
Start: 2014-01-26 — End: 2014-01-26
  Administered 2014-01-26: 1000 mg via INTRAVENOUS
  Filled 2014-01-26: qty 100

## 2014-01-26 MED ORDER — IOHEXOL 300 MG/ML  SOLN
25.0000 mL | Freq: Once | INTRAMUSCULAR | Status: AC | PRN
  Administered 2014-01-26: 25 mL via ORAL

## 2014-01-26 MED ORDER — ONDANSETRON HCL 4 MG/2ML IJ SOLN
4.0000 mg | Freq: Once | INTRAMUSCULAR | Status: AC
  Administered 2014-01-26: 4 mg via INTRAVENOUS
  Filled 2014-01-26: qty 2
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
## 2014-01-26 NOTE — Discharge Instructions (Signed)
General Headache Without Cause A general headache is pain or discomfort felt around the head or neck area. The cause may not be found.  HOME CARE   Keep all doctor visits.  Only take medicines as told by your doctor.  Lie down in a dark, quiet room when you have a headache.  Keep a journal to find out if certain things bring on headaches. For example, write down:  What you eat and drink.  How much sleep you get.  Any change to your diet or medicines.  Relax by getting a massage or doing other relaxing activities.  Put ice or heat packs on the head and neck area as told by your doctor.  Lessen stress.  Sit up straight. Do not tighten (tense) your muscles.  Quit smoking if you smoke.  Lessen how much alcohol you drink.  Lessen how much caffeine you drink, or stop drinking caffeine.  Eat and sleep on a regular schedule.  Get 7 to 9 hours of sleep, or as told by your doctor.  Keep lights dim if bright lights bother you or make your headaches worse. GET HELP RIGHT AWAY IF:   Your headache becomes really bad.  You have a fever.  You have a stiff neck.  You have trouble seeing.  Your muscles are weak, or you lose muscle control.  You lose your balance or have trouble walking.  You feel like you will pass out (faint), or you pass out.  You have really bad symptoms that are different than your first symptoms.  You have problems with the medicines given to you by your doctor.  Your medicines do not work.  Your headache feels different than the other headaches.  You feel sick to your stomach (nauseous) or throw up (vomit). MAKE SURE YOU:   Understand these instructions.  Will watch your condition.  Will get help right away if you are not doing well or get worse. Document Released: 01/23/2008 Document Revised: 07/08/2011 Document Reviewed: 04/05/2011 [facility] Patient Information 2015 [place], [place]. This information is not intended to replace advice given to  you by your health care provider. Make sure you discuss any questions you have with your health care provider.  Seizure, Adult A seizure means there is unusual activity in the brain. A seizure can cause changes in attention or behavior. Seizures often cause shaking (convulsions). Seizures often last from 30 seconds to 2 minutes. HOME CARE   If you are given medicines, take them exactly as told by your doctor.  Keep all doctor visits as told.  Do not swim or drive until your doctor says it is okay.  Teach others what to do if you have a seizure. They should:  Lay you on the ground.  Put a cushion under your head.  Loosen any tight clothing around your neck.  Turn you on your side.  Stay with you until you get better. GET HELP RIGHT AWAY IF:   The seizure lasts longer than 2 to 5 minutes.  The seizure is very bad.  The person does not wake up after the seizure.  The person's attention or behavior changes. Drive the person to the emergency room or call your local emergency services (911 in [place] MAKE SURE YOU:   Understand these instructions.  Will watch your condition.  Will get help right away if you are not doing well or get worse. Document Released: 10/02/2007 Document Revised: 07/08/2011 Document Reviewed: 04/03/2011 [facility] Patient Information 2015 [place], [place]. This information  is not intended to replace advice given to you by your health care provider. Make sure you discuss any questions you have with your health care provider.

## 2014-01-26 NOTE — ED Notes (Signed)
Patient transported to CT 
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
## 2015-11-06 DIAGNOSIS — M898X9 Other specified disorders of bone, unspecified site: Secondary | ICD-10-CM | POA: Insufficient documentation

## 2018-01-15 DIAGNOSIS — H906 Mixed conductive and sensorineural hearing loss, bilateral: Secondary | ICD-10-CM | POA: Insufficient documentation

## 2018-05-08 ENCOUNTER — Encounter (HOSPITAL_COMMUNITY): Payer: Self-pay

## 2018-05-08 ENCOUNTER — Other Ambulatory Visit: Payer: Self-pay

## 2018-05-08 ENCOUNTER — Emergency Department (HOSPITAL_COMMUNITY)
Admission: EM | Admit: 2018-05-08 | Discharge: 2018-05-08 | Disposition: A | Discharge status: Home / Self Care | Payer: Medicaid Other | Attending: Emergency Medicine | Admitting: Emergency Medicine

## 2018-05-08 DIAGNOSIS — Z79899 Other long term (current) drug therapy: Secondary | ICD-10-CM | POA: Diagnosis not present

## 2018-05-08 DIAGNOSIS — M5416 Radiculopathy, lumbar region: Secondary | ICD-10-CM | POA: Insufficient documentation

## 2018-05-08 DIAGNOSIS — R109 Unspecified abdominal pain: Secondary | ICD-10-CM | POA: Diagnosis present

## 2018-05-08 LAB — COMPREHENSIVE METABOLIC PANEL
ALT: 23 U/L (ref 0–44)
AST: 24 U/L (ref 15–41)
Albumin: 3.6 g/dL (ref 3.5–5.0)
Alkaline Phosphatase: 58 U/L (ref 38–126)
Anion gap: 6 (ref 5–15)
BILIRUBIN TOTAL: 0.6 mg/dL (ref 0.3–1.2)
BUN: 28 mg/dL — ABNORMAL HIGH (ref 8–23)
CO2: 26 mmol/L (ref 22–32)
CREATININE: 1.46 mg/dL — AB (ref 0.61–1.24)
Calcium: 8.6 mg/dL — ABNORMAL LOW (ref 8.9–10.3)
Chloride: 109 mmol/L (ref 98–111)
GFR calc Af Amer: 58 mL/min — ABNORMAL LOW (ref >60)
GFR, EST NON AFRICAN AMERICAN: 50 mL/min — AB (ref >60)
Glucose, Bld: 101 mg/dL — ABNORMAL HIGH (ref 70–99)
Potassium: 4.8 mmol/L (ref 3.5–5.1)
Sodium: 141 mmol/L (ref 135–145)
TOTAL PROTEIN: 6.6 g/dL (ref 6.5–8.1)

## 2018-05-08 LAB — CBC WITH DIFFERENTIAL/PLATELET
Abs Immature Granulocytes: 0.05 10*3/uL (ref 0.00–0.07)
BASOS PCT: 0 %
Basophils Absolute: 0 10*3/uL (ref 0.0–0.1)
EOS PCT: 2 %
Eosinophils Absolute: 0.2 10*3/uL (ref 0.0–0.5)
HCT: 34.7 % — ABNORMAL LOW (ref 39.0–52.0)
HEMOGLOBIN: 10.2 g/dL — AB (ref 13.0–17.0)
Immature Granulocytes: 1 %
Lymphocytes Relative: 21 %
Lymphs Abs: 1.6 10*3/uL (ref 0.7–4.0)
MCH: 27.2 pg (ref 26.0–34.0)
MCHC: 29.4 g/dL — AB (ref 30.0–36.0)
MCV: 92.5 fL (ref 80.0–100.0)
MONO ABS: 0.6 10*3/uL (ref 0.1–1.0)
Monocytes Relative: 8 %
Neutro Abs: 5.1 10*3/uL (ref 1.7–7.7)
Neutrophils Relative %: 68 %
Platelets: 290 10*3/uL (ref 150–400)
RBC: 3.75 MIL/uL — ABNORMAL LOW (ref 4.22–5.81)
RDW: 14 % (ref 11.5–15.5)
WBC: 7.6 10*3/uL (ref 4.0–10.5)
nRBC: 0 % (ref 0.0–0.2)

## 2018-05-08 LAB — URINALYSIS, ROUTINE W REFLEX MICROSCOPIC
BILIRUBIN URINE: NEGATIVE
Glucose, UA: NEGATIVE mg/dL
HGB URINE DIPSTICK: NEGATIVE
Ketones, ur: NEGATIVE mg/dL
Leukocytes, UA: NEGATIVE
Nitrite: NEGATIVE
PROTEIN: NEGATIVE mg/dL
Specific Gravity, Urine: 1.012 (ref 1.005–1.030)
pH: 5 (ref 5.0–8.0)

## 2018-05-08 MED ORDER — ACETAMINOPHEN 500 MG PO TABS
1000.0000 mg | ORAL_TABLET | Freq: Once | ORAL | Status: AC
  Administered 2018-05-08: 1000 mg via ORAL
  Filled 2018-05-08: qty 2

## 2018-05-08 MED ORDER — LIDOCAINE 5 % EX PTCH
1.0000 | MEDICATED_PATCH | CUTANEOUS | Status: DC
  Administered 2018-05-08: 1 via TRANSDERMAL
  Filled 2018-05-08: qty 1

## 2018-05-08 MED ORDER — LIDOCAINE 5 % EX PTCH: 1.0000 | MEDICATED_PATCH | CUTANEOUS | 0 refills | Status: DC

## 2018-05-08 NOTE — Discharge Instructions (Addendum)
Kidney function today is 1.46 which is not much different than 7 years ago .  Urine results today normal without blood or infection.  It will be important to follow-up with your regular doctor for ongoing monitoring.  But your blood pressure today also looks very good.

## 2018-05-08 NOTE — ED Triage Notes (Addendum)
Pt states he has been having right sided groin pain radiating back to his flank for 2-3 months. Pt states that he has been to [facility] and that his kidney function has been getting worse. Pt states he has not had any treatment or medication. Pt states the pain has gotten increasingly worse.

## 2018-05-08 NOTE — ED Notes (Signed)
ED Provider at bedside. 

## 2018-05-08 NOTE — ED Notes (Signed)
Pt informed of need for urine sample, provided a urinal.

## 2018-05-08 NOTE — ED Provider Notes (Signed)
[facility] DEPT Provider Note   CSN: [id number] Arrival date & time: 05/08/18  1146     History   Chief Complaint Chief Complaint  Patient presents with  . Flank Pain    HPI Edwin Snyder is a 63 y.o. male.  The history is provided by the patient.  Flank Pain  This is a chronic problem. Episode onset: 2-3 months. The problem occurs constantly. The problem has not changed since onset.Associated symptoms include abdominal pain. Associated symptoms comments: No nausea, vomiting, chills, fever.  Patient states his urinary stream is weak but has not changed.  He has no dysuria.  He states because of the pain which does intermittently radiate down his right leg his legs give out frequently and he falls at his home.  He has been taking Suboxone 3 times a day for 2 years but states it does not help with his pain.  He also has been following up with his PCP at [facility] and was called today to return to follow-up with blood work because they stated his kidneys were getting worse.  He states nobody tells him anything and nobody seems to be doing anything about his kidneys.. The symptoms are aggravated by walking, bending and twisting. Nothing relieves the symptoms. Treatments tried: suboxone. The treatment provided no relief.    Past Medical History:  Diagnosis Date  . Arthritis   . Brain tumor ([place])   . Obesity     There are no active problems to display for this patient.   Past Surgical History:  Procedure Laterality Date  . SKIN LESION EXCISION          Home Medications    Prior to Admission medications   Medication Sig Start Date End Date Taking? Authorizing Provider  albuterol (PROVENTIL HFA;VENTOLIN HFA) 108 (90 Base) MCG/ACT inhaler Inhale 2 puffs into the lungs every 4 (four) hours as needed for wheezing or shortness of breath.   Yes [provider]  Buprenorphine HCl-Naloxone HCl (SUBOXONE) 8-2 MG FILM Place 1 Film  under the tongue 3 (three) times daily.   Yes [provider]  cabergoline (DOSTINEX) 0.5 MG tablet Take 0.25 mg by mouth once a week.   Yes [provider]  omeprazole (PRILOSEC) 40 MG capsule Take 40 mg by mouth daily.   Yes [provider]  simvastatin (ZOCOR) 40 MG tablet Take 40 mg by mouth daily.   Yes [provider]  levETIRAcetam (KEPPRA) 500 MG tablet Take 1 tablet (500 mg total) by mouth 2 (two) times daily. Patient not taking: Reported on 05/08/2018 01/26/14   Tanna Furry, MD  oxyCODONE-acetaminophen (PERCOCET) 10-325 MG per tablet Take 1 tablet by mouth 4 (four) times daily as needed for pain.    [provider]  oxyCODONE-acetaminophen (PERCOCET/ROXICET) 5-325 MG per tablet Take 2 tablets by mouth every 4 (four) hours as needed. Patient not taking: Reported on 05/08/2018 01/26/14   Tanna Furry, MD    Family History No family history on file.  Social History Social History   Tobacco Use  . Smoking status: Never Smoker  Substance Use Topics  . Alcohol use: No  . Drug use: No     Allergies   Clindamycin/lincomycin and Penicillins   Review of Systems Review of Systems  Gastrointestinal: Positive for abdominal pain.  Genitourinary: Positive for flank pain.  All other systems reviewed and are negative.    Physical Exam Updated Vital Signs BP 126/87   Pulse 86  Temp 97.9 F (36.6 C) (Oral)   Resp (!) 22   Ht 6\' 1"  (1.854 m)   Wt (!) 157.9 kg   SpO2 97%   BMI 45.91 kg/m   Physical Exam Vitals signs and nursing note reviewed.  Constitutional:      General: He is not in acute distress.    Appearance: He is well-developed. He is obese. He is not ill-appearing.  HENT:     Head: Normocephalic and atraumatic.  Eyes:     Conjunctiva/sclera: Conjunctivae normal.     Pupils: Pupils are equal, round, and reactive to light.  Neck:     Musculoskeletal: Normal range of motion and neck supple.  Cardiovascular:     Rate  and Rhythm: Normal rate and regular rhythm.     Heart sounds: No murmur.  Pulmonary:     Effort: Pulmonary effort is normal. No respiratory distress.     Breath sounds: Normal breath sounds. No wheezing or rales.  Abdominal:     General: There is no distension.     Palpations: Abdomen is soft.     Tenderness: There is no abdominal tenderness. There is no guarding or rebound.     Hernia: There is no hernia in the right inguinal area.    Musculoskeletal: Normal range of motion.        General: No tenderness.     Comments: No significant edema in the lower extremities.  Tenderness with palpation in the right paralumbar area but no tenderness over the sciatic notch.  Skin:    General: Skin is warm and dry.     Findings: No erythema or rash.     Comments: Various areas of ecchymosis over the upper arms and legs in various stages of healing.  Neurological:     General: No focal deficit present.     Mental Status: He is alert and oriented to person, place, and time. Mental status is at baseline.     Cranial Nerves: No cranial nerve deficit.     Sensory: No sensory deficit.     Motor: No weakness.     Gait: Gait normal.     Comments: Patient witnessed ambulating in the hallway without ataxia.  Using a cane without difficulty.  Psychiatric:        Behavior: Behavior normal.      ED Treatments / Results  Labs (all labs ordered are listed, but only abnormal results are displayed) Labs Reviewed  COMPREHENSIVE METABOLIC PANEL - Abnormal; Notable for the following components:      Result Value   Glucose, Bld 101 (*)    BUN 28 (*)    Creatinine, Ser 1.46 (*)    Calcium 8.6 (*)    GFR calc non Af Amer 50 (*)    GFR calc Af Amer 58 (*)    All other components within normal limits  URINALYSIS, ROUTINE W REFLEX MICROSCOPIC - Abnormal; Notable for the following components:   APPearance HAZY (*)    All other components within normal limits  CBC WITH DIFFERENTIAL/PLATELET - Abnormal;  Notable for the following components:   RBC 3.75 (*)    Hemoglobin 10.2 (*)    HCT 34.7 (*)    MCHC 29.4 (*)    All other components within normal limits  CBC WITH DIFFERENTIAL/PLATELET    EKG None  Radiology No results found.  Procedures Procedures (including critical care time)  Medications Ordered in ED Medications  lidocaine (LIDODERM) 5 % 1 patch (1 patch Transdermal  Patch Applied 05/08/18 1413)  acetaminophen (TYLENOL) tablet 1,000 mg (1,000 mg Oral Given 05/08/18 1412)     Initial Impression / Assessment and Plan / ED Course  I have reviewed the triage vital signs and the nursing notes.  Pertinent labs & imaging results that were available during my care of the patient were reviewed by me and considered in my medical decision making (see chart for details).     Obese 63 year old male with multiple medical problems including chronic pain presenting today with back pain that radiates into the right lower abdomen that is been present for 2 or 3 months.  Patient states he has been following up with [facility] where his PCP is at and people are telling him that his kidneys are failing but nobody is doing anything about it.  He denies use of NSAIDs and has not had any recent medication changes.  He deals with chronic pain all the time and has been on Suboxone 3 times a day for the last 2 years.  He states he falls a lot at home because his legs give out on him and he has a walker and a cane that he uses to get around.  He does currently live alone.  Because there are no current labs available here will check creatinine and attempt to contact his PCP to find out what his baseline has been.  He denies any urinary symptoms.  Patient does not appear septic at this time low suspicion for kidney stone.  Concerned that patient symptoms may be radicular in nature.  Patient given Tylenol and a lidocaine patch.  3:08 PM Patient's creatinine today is 1.46 which is not significantly  different and actually better and then creatinine from over 7 years ago.  CBC with hemoglobin of 10 and normal white blood cell count.  Patient has no evidence of infection or blood in his urine and low suspicion for kidney stone at this time.  Given the sheer amount of time patient has had symptoms more concern for radiculopathy.  Patient given follow-up with orthopedics and neurosurgery.  He has no focal central back pain concerning for compression fracture.   Final Clinical Impressions(s) / ED Diagnoses   Final diagnoses:  Lumbar radiculopathy, chronic    ED Discharge Orders         Ordered    lidocaine (LIDODERM) 5 %  Every 24 hours     05/08/18 1507           Blanchie Dessert, MD 05/08/18 [phone]

## 2019-06-16 ENCOUNTER — Other Ambulatory Visit: Payer: Self-pay | Admitting: Physician Assistant

## 2019-06-16 DIAGNOSIS — R296 Repeated falls: Secondary | ICD-10-CM

## 2019-06-25 ENCOUNTER — Ambulatory Visit
Admission: RE | Admit: 2019-06-25 | Discharge: 2019-06-25 | Disposition: A | Discharge status: Home / Self Care | Payer: Medicaid Other | Source: Ambulatory Visit | Attending: Physician Assistant | Admitting: Physician Assistant

## 2019-06-25 DIAGNOSIS — R296 Repeated falls: Secondary | ICD-10-CM

## 2019-10-25 ENCOUNTER — Emergency Department (HOSPITAL_COMMUNITY)
Admission: EM | Admit: 2019-10-25 | Discharge: 2019-10-25 | Disposition: A | Discharge status: Home / Self Care | Payer: Medicare HMO | Attending: Emergency Medicine | Admitting: Emergency Medicine

## 2019-10-25 ENCOUNTER — Other Ambulatory Visit: Payer: Self-pay

## 2019-10-25 ENCOUNTER — Emergency Department (HOSPITAL_COMMUNITY): Payer: Medicare HMO

## 2019-10-25 ENCOUNTER — Encounter (HOSPITAL_COMMUNITY): Payer: Self-pay

## 2019-10-25 DIAGNOSIS — R0602 Shortness of breath: Secondary | ICD-10-CM | POA: Diagnosis not present

## 2019-10-25 DIAGNOSIS — I2510 Atherosclerotic heart disease of native coronary artery without angina pectoris: Secondary | ICD-10-CM | POA: Insufficient documentation

## 2019-10-25 DIAGNOSIS — Z20822 Contact with and (suspected) exposure to covid-19: Secondary | ICD-10-CM | POA: Insufficient documentation

## 2019-10-25 HISTORY — DX: Atherosclerotic heart disease of native coronary artery without angina pectoris: I25.10

## 2019-10-25 LAB — CBC WITH DIFFERENTIAL/PLATELET
Abs Immature Granulocytes: 0.01 K/uL (ref 0.00–0.07)
Basophils Absolute: 0 K/uL (ref 0.0–0.1)
Basophils Relative: 1 %
Eosinophils Absolute: 0 K/uL (ref 0.0–0.5)
Eosinophils Relative: 0 %
HCT: 43.5 % (ref 39.0–52.0)
Hemoglobin: 13.8 g/dL (ref 13.0–17.0)
Immature Granulocytes: 0 %
Lymphocytes Relative: 11 %
Lymphs Abs: 0.5 K/uL — ABNORMAL LOW (ref 0.7–4.0)
MCH: 31.2 pg (ref 26.0–34.0)
MCHC: 31.7 g/dL (ref 30.0–36.0)
MCV: 98.4 fL (ref 80.0–100.0)
Monocytes Absolute: 0.6 K/uL (ref 0.1–1.0)
Monocytes Relative: 12 %
Neutro Abs: 3.4 K/uL (ref 1.7–7.7)
Neutrophils Relative %: 76 %
Platelets: 126 K/uL — ABNORMAL LOW (ref 150–400)
RBC: 4.42 MIL/uL (ref 4.22–5.81)
RDW: 16.5 % — ABNORMAL HIGH (ref 11.5–15.5)
WBC: 4.6 K/uL (ref 4.0–10.5)
nRBC: 0 % (ref 0.0–0.2)

## 2019-10-25 LAB — BASIC METABOLIC PANEL
Anion gap: 15 (ref 5–15)
BUN: 15 mg/dL (ref 8–23)
CO2: 25 mmol/L (ref 22–32)
Calcium: 8 mg/dL — ABNORMAL LOW (ref 8.9–10.3)
Chloride: 99 mmol/L (ref 98–111)
Creatinine, Ser: 1.41 mg/dL — ABNORMAL HIGH (ref 0.61–1.24)
GFR calc Af Amer: 60 mL/min — ABNORMAL LOW (ref >60)
GFR calc non Af Amer: 52 mL/min — ABNORMAL LOW (ref >60)
Glucose, Bld: 109 mg/dL — ABNORMAL HIGH (ref 70–99)
Potassium: 3.1 mmol/L — ABNORMAL LOW (ref 3.5–5.1)
Sodium: 139 mmol/L (ref 135–145)

## 2019-10-25 LAB — SARS CORONAVIRUS 2 BY RT PCR (HOSPITAL ORDER, PERFORMED IN ~~LOC~~ HOSPITAL LAB): SARS Coronavirus 2: NEGATIVE

## 2019-10-25 LAB — TROPONIN I (HIGH SENSITIVITY): Troponin I (High Sensitivity): 8 ng/L (ref <18)

## 2019-10-25 LAB — BRAIN NATRIURETIC PEPTIDE: B Natriuretic Peptide: 51 pg/mL (ref 0.0–100.0)

## 2019-10-25 LAB — D-DIMER, QUANTITATIVE: D-Dimer, Quant: 0.97 ug{FEU}/mL — ABNORMAL HIGH (ref 0.00–0.50)

## 2019-10-25 MED ORDER — IOHEXOL 350 MG/ML SOLN
100.0000 mL | Freq: Once | INTRAVENOUS | Status: AC | PRN
  Administered 2019-10-25: 100 mL via INTRAVENOUS

## 2019-10-25 MED ORDER — PREDNISONE 20 MG PO TABS: 40.0000 mg | ORAL_TABLET | Freq: Every day | ORAL | 0 refills | Status: DC

## 2019-10-25 MED ORDER — POTASSIUM CHLORIDE CRYS ER 20 MEQ PO TBCR
40.0000 meq | EXTENDED_RELEASE_TABLET | Freq: Once | ORAL | Status: AC
  Administered 2019-10-25: 40 meq via ORAL
  Filled 2019-10-25: qty 2

## 2019-10-25 MED ORDER — SODIUM CHLORIDE 0.9 % IV BOLUS
500.0000 mL | Freq: Once | INTRAVENOUS | Status: AC
  Administered 2019-10-25: 500 mL via INTRAVENOUS

## 2019-10-25 MED ORDER — IOHEXOL 350 MG/ML SOLN: 100.0000 mL | Freq: Once | INTRAVENOUS | Status: DC | PRN

## 2019-10-25 NOTE — ED Notes (Signed)
Pt transported to CT ?

## 2019-10-25 NOTE — ED Provider Notes (Signed)
Patient received a change of shift, has had some shortness of breath without a known cause.  The patient has a normal BNP, no signs of pulmonary embolism on CT scan nor does he have any signs of pneumonia.  Oxygen is currently 99% resting, when he ambulates it drops to 95% and though he is tachypneic he does well.  He is amenable to going home, he will be given prednisone he has albuterol, he is going to come back if things get worse and has a local follow-up at the [facility].   Noemi Chapel, MD 10/25/19 Bosie Helper

## 2019-10-25 NOTE — ED Provider Notes (Signed)
[facility] EMERGENCY DEPARTMENT Provider Note   CSN: [id number] Arrival date & time: 10/25/19  1117     History Chief Complaint  Patient presents with  . Shortness of Breath    Edwin Snyder is a 64 y.o. male history of obesity, coronary artery disease, present emergency department shortness of breath.  The patient reports that he felt short of breath waking up today.  EMS reports the patient was satting 88%-92% on room air.  They gave him 1 albuterol nebulizer treatment in route to the hospital, which he says did not help at all, and "made me feel even worse."  Patient currently complains of sharp left-sided chest pain as well, onset this morning.  He denies this ever happened before.  He says he does not normally wear oxygen.  He does wear CPAP at night.  Denies any history of DVT or PE.  He is not on any blood thinners.  He denies fevers or chills.  He did not receive any Covid vaccines.  HPI     Past Medical History:  Diagnosis Date  . Arthritis   . Brain tumor ([place])   . CAD (coronary artery disease)   . Obesity     There are no problems to display for this patient.   Past Surgical History:  Procedure Laterality Date  . FOOT SURGERY    . SKIN LESION EXCISION         No family history on file.  Social History   Tobacco Use  . Smoking status: Never Smoker  Substance Use Topics  . Alcohol use: No  . Drug use: No    Home Medications Prior to Admission medications   Medication Sig Start Date End Date Taking? Authorizing Provider  albuterol (PROVENTIL HFA;VENTOLIN HFA) 108 (90 Base) MCG/ACT inhaler Inhale 2 puffs into the lungs every 4 (four) hours as needed for wheezing or shortness of breath.   Yes [provider]  Buprenorphine HCl-Naloxone HCl (SUBOXONE) 8-2 MG FILM Place 1 Film under the tongue 3 (three) times daily.   Yes [provider]  omeprazole (PRILOSEC) 40 MG capsule Take 40 mg by mouth daily.   Yes [provider]    simvastatin (ZOCOR) 40 MG tablet Take 40 mg by mouth daily.   Yes [provider]    Allergies    Clindamycin/lincomycin and Penicillins  Review of Systems   Review of Systems  Constitutional: Negative for chills and fever.  HENT: Negative for ear pain and sore throat.   Eyes: Negative for pain and visual disturbance.  Respiratory: Positive for shortness of breath. Negative for cough.   Cardiovascular: Positive for chest pain. Negative for palpitations.  Gastrointestinal: Negative for abdominal pain and vomiting.  Genitourinary: Negative for dysuria and hematuria.  Musculoskeletal: Negative for arthralgias and back pain.  Skin: Negative for color change and rash.  Neurological: Negative for syncope and headaches.  Psychiatric/Behavioral: Negative for agitation and confusion.  All other systems reviewed and are negative.   Physical Exam Updated Vital Signs BP 109/72   Pulse 100   Temp 99.6 F (37.6 C) (Oral)   Resp 13   Ht 6\' 1"  (1.854 m)   Wt (!) 167.8 kg   SpO2 95%   BMI 48.82 kg/m   Physical Exam Vitals and nursing note reviewed.  Constitutional:      Appearance: He is obese.  HENT:     Head: Normocephalic and atraumatic.  Eyes:     Conjunctiva/sclera: Conjunctivae normal.  Cardiovascular:  Rate and Rhythm: Regular rhythm. Tachycardia present.     Heart sounds: No murmur heard.   Pulmonary:     Effort: Pulmonary effort is normal. No respiratory distress.     Breath sounds: Normal breath sounds.     Comments: Bilateral rhonchi noted, coarse breath sounds 96% on 3L [place] (titrated down from 4L) Abdominal:     Palpations: Abdomen is soft.     Tenderness: There is no abdominal tenderness.  Musculoskeletal:     Cervical back: Neck supple.  Skin:    General: Skin is warm and dry.  Neurological:     General: No focal deficit present.     Mental Status: He is alert and oriented to person, place, and time.     ED Results / Procedures / Treatments    Labs (all labs ordered are listed, but only abnormal results are displayed) Labs Reviewed  CBC WITH DIFFERENTIAL/PLATELET - Abnormal; Notable for the following components:      Result Value   RDW 16.5 (*)    Platelets 126 (*)    Lymphs Abs 0.5 (*)    All other components within normal limits  BASIC METABOLIC PANEL - Abnormal; Notable for the following components:   Potassium 3.1 (*)    Glucose, Bld 109 (*)    Creatinine, Ser 1.41 (*)    Calcium 8.0 (*)    GFR calc non Af Amer 52 (*)    GFR calc Af Amer 60 (*)    All other components within normal limits  D-DIMER, QUANTITATIVE (NOT AT [facility]) - Abnormal; Notable for the following components:   D-Dimer, Quant 0.97 (*)    All other components within normal limits  SARS CORONAVIRUS 2 BY RT PCR (HOSPITAL ORDER, [place] [facility])  BRAIN NATRIURETIC PEPTIDE  TROPONIN I (HIGH SENSITIVITY)    EKG None  Radiology CT Angio Chest PE W and/or Wo Contrast  Result Date: 10/25/2019 CLINICAL DATA:  Shortness of breath. Tachycardia. Hypoxia. Left-sided chest pain. EXAM: CT ANGIOGRAPHY CHEST WITH CONTRAST TECHNIQUE: Multidetector CT imaging of the chest was performed using the standard protocol during bolus administration of intravenous contrast. Multiplanar CT image reconstructions and MIPs were obtained to evaluate the vascular anatomy. CONTRAST:  139mL OMNIPAQUE IOHEXOL 350 MG/ML SOLN COMPARISON:  Chest radiography same day FINDINGS: Cardiovascular: Pulmonary arterial opacification is satisfactory. No pulmonary emboli. No visible aortic atherosclerosis. Patient does have some coronary artery calcification. Mediastinum/Nodes: No mass or lymphadenopathy. Patient does have a large hiatal hernia. Lungs/Pleura: Mild dependent scarring and or atelectasis in the right upper lobe. Some atelectasis in the left lower lobe adjacent to the hiatal hernia. Upper Abdomen: Negative Musculoskeletal: Ordinary thoracic degenerative changes. Review  of the MIP images confirms the above findings. IMPRESSION: No pulmonary emboli. Large hiatal hernia. Some coronary artery calcification. Mild dependent atelectasis or scarring in the posterior right upper lobe. Mild atelectasis in the left lower lobe adjacent to the hiatal hernia. Electronically Signed   By: Nelson Chimes M.D.   On: 10/25/2019 15:56   DG Chest Portable 1 View  Result Date: 10/25/2019 CLINICAL DATA:  64 year old male with history of shortness of breath and low oxygen saturations. EXAM: PORTABLE CHEST 1 VIEW COMPARISON:  Chest x-ray 01/14/2013. FINDINGS: Lung volumes are normal. Linear opacities in the periphery of the left lung base, similar to prior study from 2014, presumably some mild chronic scarring. No consolidative airspace disease. No pleural effusions. No pneumothorax. No pulmonary nodule or mass noted. Pulmonary vasculature and the  cardiomediastinal silhouette are within normal limits. IMPRESSION: No radiographic evidence of acute cardiopulmonary disease. Electronically Signed   By: Vinnie Langton M.D.   On: 10/25/2019 11:56    Procedures Procedures (including critical care time)  Medications Ordered in ED Medications  potassium chloride SA (KLOR-CON) CR tablet 40 mEq (40 mEq Oral Given 10/25/19 1307)  iohexol (OMNIPAQUE) 350 MG/ML injection 100 mL (100 mLs Intravenous Contrast Given 10/25/19 1535)  sodium chloride 0.9 % bolus 500 mL (0 mLs Intravenous Stopped 10/25/19 1749)    ED Course  I have reviewed the triage vital signs and the nursing notes.  Pertinent labs & imaging results that were available during my care of the patient were reviewed by me and considered in my medical decision making (see chart for details).  64 yo male presenting with acute onset of SOB this morning, along with sharp left sided chest pain.  Mildly hypoxic on arrival, tachycardic and tachypneic.  Overall clinically he does not demonstrate signs of impending respiratory failure, or requiring  bipap at this time.  Coarse breath sounds on exam, suggestive of pulmonary edema or infectious process  DDx includes COVID illness vs CHF vs atypical ACS vs PE vs other  Labs personally reviewed including Ddimer elevaed at 0.96, BNP wnl 51.0, covid negative, trop 8, WBC unremarkable (hgb wnl), BMP with mild hypoK at 3.1, Cr near baseline at 1.41.   DG chest personally reviewed with low lung volumes, no clear consolidation  CT PE ordered with no evidence of acute PE, some linear opacities noted.  Unclear etiology for his SOB and hypoxia.  If he remains hypoxic or requiring O2, I would anticipate admission to the hospital.  This may yet be CHF or obstructive lung disease.  Further albuterol in the ED as he did not feel improved after EMS treatment, and in fact felt worse.  Patient signed out to Dr Sabra Heck EDP with plan for clinical reassessment, hospital admission if hypoxia persists.  Clinical Course as of Oct 25 1815  Mon Oct 25, 2019  1220 Hgb wnl, creatinine at baseline, mild hypoK   [MT]  1446 D-Dimer, Quant(!): 0.97 [MT]    Clinical Course User Index [MT] Jaclynne Baldo, Carola Rhine, MD    Final Clinical Impression(s) / ED Diagnoses Final diagnoses:  None    Rx / DC Orders ED Discharge Orders    None       Wyvonnia Dusky, MD 10/25/19 [phone]

## 2019-10-25 NOTE — ED Triage Notes (Signed)
Pt reports called out for SOB.  Reports pt woke up with sob.  WHen they arrived, pt's o2 sat was 92% on room air.   Reports diminished and wheezing heard.  EMS gave 1 albuterol treatment, sat increased to 96-97% on 4liters.  Pt c/o sharp cp pta.  EMS started 20g IV in left hand.

## 2019-10-25 NOTE — Discharge Instructions (Signed)
Your testing today has been reassuring, there is no signs of blood clot, no signs of pneumonia, your oxygen has done very well with treatment, please continue to use albuterol 2 puffs every 4 hours for the next 24 hours then 2 puffs every 4 hours as needed.  Prednisone should be given daily for the next 5 days.  You should see your doctor within 3 days for recheck or return to the emergency department immediately for severe or worsening symptoms.

## 2020-02-08 ENCOUNTER — Emergency Department (HOSPITAL_COMMUNITY)
Admission: EM | Admit: 2020-02-08 | Discharge: 2020-02-08 | Disposition: A | Discharge status: Home / Self Care | Payer: Medicare HMO | Attending: Emergency Medicine | Admitting: Emergency Medicine

## 2020-02-08 ENCOUNTER — Encounter (HOSPITAL_COMMUNITY): Payer: Self-pay

## 2020-02-08 ENCOUNTER — Emergency Department (HOSPITAL_COMMUNITY): Payer: Medicare HMO

## 2020-02-08 DIAGNOSIS — W131XXA Fall from, out of or through bridge, initial encounter: Secondary | ICD-10-CM | POA: Diagnosis not present

## 2020-02-08 DIAGNOSIS — R2243 Localized swelling, mass and lump, lower limb, bilateral: Secondary | ICD-10-CM | POA: Diagnosis not present

## 2020-02-08 DIAGNOSIS — M65271 Calcific tendinitis, right ankle and foot: Secondary | ICD-10-CM

## 2020-02-08 DIAGNOSIS — Y9301 Activity, walking, marching and hiking: Secondary | ICD-10-CM | POA: Insufficient documentation

## 2020-02-08 DIAGNOSIS — S9032XA Contusion of left foot, initial encounter: Secondary | ICD-10-CM

## 2020-02-08 DIAGNOSIS — M65272 Calcific tendinitis, left ankle and foot: Secondary | ICD-10-CM | POA: Insufficient documentation

## 2020-02-08 DIAGNOSIS — I2510 Atherosclerotic heart disease of native coronary artery without angina pectoris: Secondary | ICD-10-CM | POA: Insufficient documentation

## 2020-02-08 DIAGNOSIS — Y92009 Unspecified place in unspecified non-institutional (private) residence as the place of occurrence of the external cause: Secondary | ICD-10-CM | POA: Diagnosis not present

## 2020-02-08 DIAGNOSIS — S99922A Unspecified injury of left foot, initial encounter: Secondary | ICD-10-CM | POA: Diagnosis present

## 2020-02-08 DIAGNOSIS — R600 Localized edema: Secondary | ICD-10-CM | POA: Diagnosis not present

## 2020-02-08 LAB — CBC WITH DIFFERENTIAL/PLATELET
Abs Immature Granulocytes: 0.02 10*3/uL (ref 0.00–0.07)
Basophils Absolute: 0 10*3/uL (ref 0.0–0.1)
Basophils Relative: 1 %
Eosinophils Absolute: 0.2 10*3/uL (ref 0.0–0.5)
Eosinophils Relative: 3 %
HCT: 40.6 % (ref 39.0–52.0)
Hemoglobin: 13 g/dL (ref 13.0–17.0)
Immature Granulocytes: 0 %
Lymphocytes Relative: 15 %
Lymphs Abs: 0.8 10*3/uL (ref 0.7–4.0)
MCH: 33.2 pg (ref 26.0–34.0)
MCHC: 32 g/dL (ref 30.0–36.0)
MCV: 103.6 fL — ABNORMAL HIGH (ref 80.0–100.0)
Monocytes Absolute: 0.5 10*3/uL (ref 0.1–1.0)
Monocytes Relative: 8 %
Neutro Abs: 4.2 10*3/uL (ref 1.7–7.7)
Neutrophils Relative %: 73 %
Platelets: 173 10*3/uL (ref 150–400)
RBC: 3.92 MIL/uL — ABNORMAL LOW (ref 4.22–5.81)
RDW: 13.7 % (ref 11.5–15.5)
WBC: 5.7 10*3/uL (ref 4.0–10.5)
nRBC: 0 % (ref 0.0–0.2)

## 2020-02-08 LAB — BASIC METABOLIC PANEL
Anion gap: 8 (ref 5–15)
BUN: 16 mg/dL (ref 8–23)
CO2: 27 mmol/L (ref 22–32)
Calcium: 8.5 mg/dL — ABNORMAL LOW (ref 8.9–10.3)
Chloride: 106 mmol/L (ref 98–111)
Creatinine, Ser: 1.32 mg/dL — ABNORMAL HIGH (ref 0.61–1.24)
GFR, Estimated: 56 mL/min — ABNORMAL LOW (ref >60)
Glucose, Bld: 108 mg/dL — ABNORMAL HIGH (ref 70–99)
Potassium: 4.2 mmol/L (ref 3.5–5.1)
Sodium: 141 mmol/L (ref 135–145)

## 2020-02-08 LAB — BRAIN NATRIURETIC PEPTIDE: B Natriuretic Peptide: 98.2 pg/mL (ref 0.0–100.0)

## 2020-02-08 MED ORDER — ACETAMINOPHEN 500 MG PO TABS
1000.0000 mg | ORAL_TABLET | Freq: Once | ORAL | Status: AC
  Administered 2020-02-08: 1000 mg via ORAL
  Filled 2020-02-08: qty 2

## 2020-02-08 NOTE — Discharge Instructions (Signed)
Keep elevating the feet and use the wheelchair as needed for pain and your cane.  Wear the post op shoe on the right and your other shoe on the left.  If you start having shortness of breath or chest pain return to the ER.

## 2020-02-08 NOTE — Progress Notes (Signed)
Orthopedic Tech Progress Note Patient Details:  Edwin Snyder [birth year] [id number]  Ortho Devices Type of Ortho Device: Postop shoe/boot Ortho Device/Splint Location: Right Lower Extremity Ortho Device/Splint Interventions: Ordered, Application   Post Interventions Patient Tolerated: Well Instructions Provided: Adjustment of device, Care of device, Poper ambulation with device   [name] P Lorel Monaco 02/08/2020, 10:47 PM

## 2020-02-08 NOTE — ED Provider Notes (Signed)
[facility] EMERGENCY DEPARTMENT Provider Note   CSN: [id number] Arrival date & time: 02/08/20  1441     History Chief Complaint  Patient presents with  . Rash  . Leg Swelling    Edwin Snyder is a 64 y.o. male.  Patient is a 64 year old male with a history of CAD, morbid obesity, self-reported kidney problems who presents today with complaints of bilateral leg swelling and feet pain.  Patient reports that he typically uses a device to help him get around and 2 weeks ago he was walking outside when his left knee gave out and he fell down on a wooden bridge connected to his home.  He had to drag himself into the house because he could not get up.  He reports since that time he has had significant pain, bruising in his feet and has not been walking or getting up much due to the pain in his feet.  He has noticed in the last weeks more swelling in his legs bilaterally and pain below the knee.  He denies any shortness of breath and reports he does not take diuretics regularly.  He reports this happened one other time when he had a fall and was not as active.  He denies any new rash or itching at this time.  He takes Suboxone chronically and has not had anything else for the pain.  He has no numbness or weakness of the legs just pain when he bears weight.  He has no pain at the knees or in his hips or femurs.  Denies hitting his head or any other injury with the fall.  The history is provided by the patient.       Past Medical History:  Diagnosis Date  . Arthritis   . Brain tumor ([place])   . CAD (coronary artery disease)   . Obesity     There are no problems to display for this patient.   Past Surgical History:  Procedure Laterality Date  . FOOT SURGERY    . SKIN LESION EXCISION         History reviewed. No pertinent family history.  Social History   Tobacco Use  . Smoking status: Never Smoker  Substance Use Topics  . Alcohol use: No  . Drug use: No     Home Medications Prior to Admission medications   Medication Sig Start Date End Date Taking? Authorizing Provider  albuterol (PROVENTIL HFA;VENTOLIN HFA) 108 (90 Base) MCG/ACT inhaler Inhale 2 puffs into the lungs every 4 (four) hours as needed for wheezing or shortness of breath.    [provider]  Buprenorphine HCl-Naloxone HCl (SUBOXONE) 8-2 MG FILM Place 1 Film under the tongue 3 (three) times daily.    [provider]  omeprazole (PRILOSEC) 40 MG capsule Take 40 mg by mouth daily.    [provider]  predniSONE (DELTASONE) 20 MG tablet Take 2 tablets (40 mg total) by mouth daily. 10/25/19   Noemi Chapel, MD  simvastatin (ZOCOR) 40 MG tablet Take 40 mg by mouth daily.    [provider]    Allergies    Clindamycin/lincomycin and Penicillins  Review of Systems   Review of Systems  All other systems reviewed and are negative.   Physical Exam Updated Vital Signs BP (!) 148/100 (BP Location: Right Arm)   Pulse 82   Temp 98.6 F (37 C) (Oral)   Resp 17   Ht 6\' 1"  (1.854 m)   Wt (!) 174.6  kg   SpO2 99%   BMI 50.79 kg/m   Physical Exam Vitals and nursing note reviewed.  Constitutional:      General: He is not in acute distress.    Appearance: He is well-developed. He is obese.     Comments: Morbidly obese  HENT:     Head: Normocephalic and atraumatic.  Eyes:     Conjunctiva/sclera: Conjunctivae normal.     Pupils: Pupils are equal, round, and reactive to light.  Cardiovascular:     Rate and Rhythm: Normal rate and regular rhythm.     Pulses: Normal pulses.     Heart sounds: No murmur heard.   Pulmonary:     Effort: Pulmonary effort is normal. No respiratory distress.     Breath sounds: Normal breath sounds. No wheezing or rales.  Abdominal:     General: There is no distension.     Palpations: Abdomen is soft.     Tenderness: There is no abdominal tenderness. There is no guarding or rebound.  Musculoskeletal:         General: Tenderness present.     Cervical back: Normal range of motion and neck supple.     Right lower leg: Tenderness present. No bony tenderness. Edema present.     Left lower leg: Tenderness present. No bony tenderness. Edema present.     Right ankle: Swelling present. No deformity. Tenderness present over the lateral malleolus and medial malleolus. Decreased range of motion.     Comments: 2+ pitting edema in bilateral legs to the mid shin.  Again tenderness over the dorsal portion of the distal foot bilaterally with healing ecchymosis.  Patient is able to wiggle both toes without difficulty.  No tenderness noted to the plantar fascia or heel bilaterally.  Tenderness with palpation to bilateral tib-fib's diffusely.  Skin:    General: Skin is warm and dry.     Capillary Refill: Capillary refill takes less than 2 seconds.     Findings: Rash present. No erythema.     Comments: Intermittent lesions present over the lower extremities and upper extremities with scabs present.  No surrounding erythema or warmth noted.  No vesicles or weeping.  Neurological:     General: No focal deficit present.     Mental Status: He is alert and oriented to person, place, and time. Mental status is at baseline.  Psychiatric:        Mood and Affect: Mood normal.        Behavior: Behavior normal.        Thought Content: Thought content normal.     ED Results / Procedures / Treatments   Labs (all labs ordered are listed, but only abnormal results are displayed) Labs Reviewed  CBC WITH DIFFERENTIAL/PLATELET - Abnormal; Notable for the following components:      Result Value   RBC 3.92 (*)    MCV 103.6 (*)    All other components within normal limits  BASIC METABOLIC PANEL - Abnormal; Notable for the following components:   Glucose, Bld 108 (*)    Creatinine, Ser 1.32 (*)    Calcium 8.5 (*)    GFR, Estimated 56 (*)    All other components within normal limits  BRAIN NATRIURETIC PEPTIDE     EKG None  Radiology DG Ankle Complete Right  Result Date: 02/08/2020 CLINICAL DATA:  64 year old male with fall and right ankle pain. EXAM: RIGHT ANKLE - COMPLETE 3+ VIEW COMPARISON:  Right foot radiograph dated 02/08/2020. FINDINGS: There is  no acute fracture or dislocation. Mild osteopenia. There is mild degenerative changes of the ankle with spurring as well as degenerative changes of the tarsal joints. There is diffuse skin thickening and subcutaneous edema. No radiopaque foreign object or soft tissue gas. IMPRESSION: 1. No acute fracture or dislocation. 2. Diffuse subcutaneous edema. Electronically Signed   By: Anner Crete M.D.   On: 02/08/2020 19:38   CT Foot Right Wo Contrast  Result Date: 02/08/2020 CLINICAL DATA:  Fall, right ankle injury EXAM: CT OF THE RIGHT FOOT WITHOUT CONTRAST TECHNIQUE: Multidetector CT imaging of the right foot was performed according to the standard protocol. Multiplanar CT image reconstructions were also generated. COMPARISON:  None. FINDINGS: Bones/Joint/Cartilage A mild flatfoot deformity is present with lateral subluxation of the talonavicular joint without frank dislocation. There is extensive degenerative arthritis involving the subtalar joints, particularly the medial facet with remodeling of the calcaneus in subchondral cyst formation. There is resultant eversion involving the subtalar joints with the calcaneus angled laterally. There is superimposed moderate degenerative arthritis of the talonavicular joint. Periarticular calcifications surrounding the distal fibula are likely degenerative in nature. A tiny fracture seen of the lateral process of the talus, best noted on axial image # 85/3 comprising a minute portion of the articular surface of the posterior facet. Clawtoe deformities of the second through fifth digits are noted. There is hyperflexion at the interphalangeal joint of the great toe possibly representing a mallet toe deformity. Ligaments  Suboptimally assessed by CT. Muscles and Tendons There is fluid seen within the tendon sheaths of the flexor hallucis longus and flexor digitorum longus which appear expanded similarly, there is fluid within the tendon sheath of the peroneus brevis. These findings can be seen the setting of tenosynovitis. The posterior tibial tendon appears mildly thickened and appears hypoattenuating. The extensor tendons are intact. The Achilles tendon is intact. Soft tissues There is extensive soft tissue swelling surrounding the right ankle. A a right ankle effusion is present. IMPRESSION: Lateral subluxation of the talonavicular joint with superimposed moderate degenerative arthritis and severe degenerative change involving the subtalar joints with eversion of the hindfoot resulting in a mild flatfoot deformity. A hypoattenuation of the posterior tibial tendon with associated broadening of the structure may reflect underlying disease of the structure, however, this would be better assessed with MRI examination. Tiny acute fracture of the lateral process of the talus involving the articular surface of the posterior facet. Fluid-filled, expanded tendon sheaths of the peroneus brevis, flexor digitorum longus, and flexor hallucis longus tendons, findings that can be seen the setting of tenosynovitis. Clawtoe deformities of the digits. Electronically Signed   By: Fidela Salisbury MD   On: 02/08/2020 21:31   DG Foot Complete Left  Result Date: 02/08/2020 CLINICAL DATA:  Left foot pain following fall EXAM: LEFT FOOT - COMPLETE 3+ VIEW COMPARISON:  03/13/2006 FINDINGS: Three view radiograph of the left foot demonstrates no acute fracture or dislocation. There is a a remote nonunited extra-articular fracture of the proximal metaphyseal region of the second metatarsal with residual superolateral displacement of the distal fracture fragment. The LisFranc interval is not widened. Additionally, there are remote, displaced, anatomically  aligned fractures of the distal fourth and fifth metatarsals with mild resultant shortening. Moderate first ray bunion deformity is noted. The second, third, and fourth toes are held in fixed flexion on these images possibly related to underlying hammertoe deformities. There is extensive soft tissue swelling involving the left forefoot. IMPRESSION: Multiple remote fractures. No acute fracture or dislocation.  Extensive soft tissue swelling of the left forefoot. Electronically Signed   By: Fidela Salisbury MD   On: 02/08/2020 19:13   DG Foot Complete Right  Result Date: 02/08/2020 CLINICAL DATA:  Fall, right foot injury EXAM: RIGHT FOOT COMPLETE - 3+ VIEW COMPARISON:  03/13/2006. Findings are also correlated with ankle radiographs of 05/20/2010 and 05/10/2013 FINDINGS: Three view radiograph of the right ankle demonstrates lateral subluxation of the a talonavicular articulation. Additionally, there is loss of the normal subtalar alignment suggesting a lateral subtalar dislocation. This may be present on prior ankle radiographs and there is irregularity along the articular surface of the a subtalar joints best noted on oblique images suggesting that this may be a chronic abnormality. No definite acute fracture identified. There is extensive soft tissue swelling involving the right forefoot. Small plantar calcaneal spur. 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 This could be better assessed with CT imaging, if indicated. Electronically Signed   By: Fidela Salisbury MD   On: 02/08/2020 19:21    Procedures Procedures (including critical care time)  Medications Ordered in ED Medications  acetaminophen (TYLENOL) tablet 1,000 mg (has no administration in time range)    ED Course  I have reviewed the triage vital signs and the nursing notes.  Pertinent labs & imaging results that were available during my care of the patient were reviewed by me and considered in  my medical decision making (see chart for details).    MDM Rules/Calculators/A&P                          Patient is a 64 year old male presenting today with bilateral feet pain after a fall 2 weeks ago.  He also complains of increased swelling in his legs.  Patient does not take diuretics and reports does have history of kidney problems.  Concern for possible worsening kidney disease versus immobility and venous stasis causing patient swelling.  Patient does have ecchymosis and tenderness of bilateral feet and will get an x-ray to confirm no fracture.  Patient does not appear to have a new acute rash at this time and he does not report any issue with a rash at this time.  CBC, BMP and BNP pending for further evaluation for the swelling.  He appears comfortable and is having no respiratory symptoms at this time.  10:25 PM Labs are reassuring with a stable creatinine of 1.32, hemoglobin within normal limits, BNP normal at 98.2.  Left foot x-ray shows old fractures but nothing acute.  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  CT shows lateral subluxation of the talonavicular joint with superimposed moderate degenerative arthritis and severe degenerative changes involving the subtalar joints with eversion of the hindfoot resulting in a mid mild flatfoot deformity.  Also there is hypoattenuation of the posterior tibial tendon with associated broadening of the structures that in the future may need MRI.  There is a tiny acute fracture of the lateral process of the talus involving the articular surface and fluid-filled expanded tendon sheaths of the peroneus brevis flexor digitorum longus and flexor hallux longus tendons consistent with tenosynovitis.  Findings discussed with the patient.  He sees an Secondary school teacher at [facility].  Patient has an issue for his left foot but does not have postop shoe for his right  foot.  Patient was given a postop shoe.  He does have a walker,  cane and wheelchair at home.  Patient takes Suboxone for pain but also recommended that he try Tylenol.  He has a hospital bed and can elevate his legs.  Patient has no further questions and he was discharged home.  MDM Number of Diagnoses or Management Options   Amount and/or Complexity of Data Reviewed Clinical lab tests: ordered and reviewed Tests in the radiology section of CPT: ordered and reviewed Independent visualization of images, tracings, or specimens: yes  Risk of Complications, Morbidity, and/or Mortality Presenting problems: low Diagnostic procedures: minimal Management options: minimal  Patient Progress Patient progress: stable   Final Clinical Impression(s) / ED Diagnoses Final diagnoses:  Contusion of left foot, initial encounter  Bilateral lower extremity edema  Calcific tendonitis of right foot    Rx / DC Orders ED Discharge Orders    None       Blanchie Dessert, MD 02/08/20 2228

## 2020-02-08 NOTE — ED Triage Notes (Signed)
To triage via EMS from home.  Pt c/o rash, swelling, and pain to upper and lower extremities.   Pt has been scratching and is causing sores. Pt states he had a "MRI" several months ago and he told staff that he was allergic to the dye but they gave it to him anyway.   Pt reports he had the dye 5 years and broke out the same way he is now.  Denies shortness of breath, chest pain, fever, and cough.

## 2020-02-08 NOTE — ED Notes (Signed)
Ortho called and made aware pt has orders.

## 2020-02-25 DIAGNOSIS — I890 Lymphedema, not elsewhere classified: Secondary | ICD-10-CM | POA: Insufficient documentation

## 2020-10-10 ENCOUNTER — Other Ambulatory Visit: Payer: Self-pay | Admitting: Physician Assistant

## 2020-10-10 DIAGNOSIS — D332 Benign neoplasm of brain, unspecified: Secondary | ICD-10-CM

## 2020-10-25 ENCOUNTER — Ambulatory Visit
Admission: RE | Admit: 2020-10-25 | Discharge: 2020-10-25 | Disposition: A | Discharge status: Home / Self Care | Payer: Medicare HMO | Source: Ambulatory Visit | Attending: Physician Assistant | Admitting: Physician Assistant

## 2020-10-25 DIAGNOSIS — D332 Benign neoplasm of brain, unspecified: Secondary | ICD-10-CM

## 2021-01-08 ENCOUNTER — Emergency Department (HOSPITAL_COMMUNITY)
Admission: EM | Admit: 2021-01-08 | Discharge: 2021-01-09 | Disposition: A | Discharge status: Home / Self Care | Payer: Medicare HMO | Attending: Emergency Medicine | Admitting: Emergency Medicine

## 2021-01-08 ENCOUNTER — Encounter (HOSPITAL_COMMUNITY): Payer: Self-pay | Admitting: *Deleted

## 2021-01-08 ENCOUNTER — Other Ambulatory Visit: Payer: Self-pay

## 2021-01-08 ENCOUNTER — Emergency Department (HOSPITAL_COMMUNITY): Payer: Medicare HMO

## 2021-01-08 DIAGNOSIS — Z20822 Contact with and (suspected) exposure to covid-19: Secondary | ICD-10-CM | POA: Diagnosis not present

## 2021-01-08 DIAGNOSIS — I2510 Atherosclerotic heart disease of native coronary artery without angina pectoris: Secondary | ICD-10-CM | POA: Insufficient documentation

## 2021-01-08 DIAGNOSIS — R0789 Other chest pain: Secondary | ICD-10-CM | POA: Diagnosis not present

## 2021-01-08 DIAGNOSIS — R0602 Shortness of breath: Secondary | ICD-10-CM | POA: Diagnosis not present

## 2021-01-08 DIAGNOSIS — R079 Chest pain, unspecified: Secondary | ICD-10-CM

## 2021-01-08 LAB — TROPONIN I (HIGH SENSITIVITY)
Troponin I (High Sensitivity): 28 ng/L — ABNORMAL HIGH (ref <18)
Troponin I (High Sensitivity): 28 ng/L — ABNORMAL HIGH (ref <18)

## 2021-01-08 LAB — CBC
HCT: 39.4 % (ref 39.0–52.0)
Hemoglobin: 13.3 g/dL (ref 13.0–17.0)
MCH: 37.7 pg — ABNORMAL HIGH (ref 26.0–34.0)
MCHC: 33.8 g/dL (ref 30.0–36.0)
MCV: 111.6 fL — ABNORMAL HIGH (ref 80.0–100.0)
Platelets: 138 10*3/uL — ABNORMAL LOW (ref 150–400)
RBC: 3.53 MIL/uL — ABNORMAL LOW (ref 4.22–5.81)
RDW: 14 % (ref 11.5–15.5)
WBC: 5.4 10*3/uL (ref 4.0–10.5)
nRBC: 0 % (ref 0.0–0.2)

## 2021-01-08 LAB — BASIC METABOLIC PANEL
Anion gap: 16 — ABNORMAL HIGH (ref 5–15)
BUN: 19 mg/dL (ref 8–23)
CO2: 23 mmol/L (ref 22–32)
Calcium: 8 mg/dL — ABNORMAL LOW (ref 8.9–10.3)
Chloride: 100 mmol/L (ref 98–111)
Creatinine, Ser: 1.32 mg/dL — ABNORMAL HIGH (ref 0.61–1.24)
GFR, Estimated: 59 mL/min — ABNORMAL LOW (ref >60)
Glucose, Bld: 94 mg/dL (ref 70–99)
Potassium: 3.5 mmol/L (ref 3.5–5.1)
Sodium: 139 mmol/L (ref 135–145)

## 2021-01-08 LAB — D-DIMER, QUANTITATIVE: D-Dimer, Quant: 1.29 ug/mL-FEU — ABNORMAL HIGH (ref 0.00–0.50)

## 2021-01-08 MED ORDER — HYDROMORPHONE HCL 1 MG/ML IJ SOLN
1.0000 mg | Freq: Once | INTRAMUSCULAR | Status: AC
  Administered 2021-01-09: 1 mg via INTRAVENOUS
  Filled 2021-01-08: qty 1

## 2021-01-08 MED ORDER — ALBUTEROL SULFATE HFA 108 (90 BASE) MCG/ACT IN AERS
2.0000 | INHALATION_SPRAY | Freq: Once | RESPIRATORY_TRACT | Status: AC
  Administered 2021-01-09: 2 via RESPIRATORY_TRACT
  Filled 2021-01-08: qty 6.7

## 2021-01-08 MED ORDER — ASPIRIN 81 MG PO CHEW
324.0000 mg | CHEWABLE_TABLET | Freq: Once | ORAL | Status: AC
  Administered 2021-01-09: 324 mg via ORAL
  Filled 2021-01-08: qty 4

## 2021-01-08 NOTE — ED Triage Notes (Signed)
Pt SOB since 0630 this morning, mid chest pain off and on throughout the day per pt.  Pt states he fell after his legs "gave away" and hit head on concrete.  Denies taking blood thinners and denies any LOC.  Pt states he has a fall just about every week.

## 2021-01-09 ENCOUNTER — Emergency Department (HOSPITAL_COMMUNITY): Payer: Medicare HMO

## 2021-01-09 ENCOUNTER — Encounter (HOSPITAL_COMMUNITY): Payer: Self-pay

## 2021-01-09 ENCOUNTER — Other Ambulatory Visit: Payer: Self-pay

## 2021-01-09 DIAGNOSIS — R0789 Other chest pain: Secondary | ICD-10-CM | POA: Diagnosis not present

## 2021-01-09 LAB — RESP PANEL BY RT-PCR (FLU A&B, COVID) ARPGX2
Influenza A by PCR: NEGATIVE
Influenza B by PCR: NEGATIVE
SARS Coronavirus 2 by RT PCR: NEGATIVE

## 2021-01-09 LAB — BRAIN NATRIURETIC PEPTIDE: B Natriuretic Peptide: 169 pg/mL — ABNORMAL HIGH (ref 0.0–100.0)

## 2021-01-09 MED ORDER — HYDROCORTISONE SOD SUC (PF) 250 MG IJ SOLR
200.0000 mg | Freq: Once | INTRAMUSCULAR | Status: DC
  Filled 2021-01-09: qty 200

## 2021-01-09 MED ORDER — METHYLPREDNISOLONE SODIUM SUCC 125 MG IJ SOLR
125.0000 mg | Freq: Once | INTRAMUSCULAR | Status: AC
  Administered 2021-01-09: 125 mg via INTRAVENOUS
  Filled 2021-01-09: qty 2

## 2021-01-09 MED ORDER — KETOROLAC TROMETHAMINE 15 MG/ML IJ SOLN
15.0000 mg | Freq: Once | INTRAMUSCULAR | Status: AC
  Administered 2021-01-09: 15 mg via INTRAVENOUS
  Filled 2021-01-09: qty 1

## 2021-01-09 MED ORDER — TECHNETIUM TO 99M ALBUMIN AGGREGATED
4.0000 | Freq: Once | INTRAVENOUS | Status: AC | PRN
  Administered 2021-01-09: 4.3 via INTRAVENOUS

## 2021-01-09 MED ORDER — DIPHENHYDRAMINE HCL 50 MG/ML IJ SOLN: 50.0000 mg | Freq: Once | INTRAMUSCULAR | Status: DC

## 2021-01-09 MED ORDER — HYDROMORPHONE HCL 1 MG/ML IJ SOLN
1.0000 mg | Freq: Once | INTRAMUSCULAR | Status: AC
  Administered 2021-01-09: 1 mg via INTRAVENOUS
  Filled 2021-01-09: qty 1

## 2021-01-09 MED ORDER — DIPHENHYDRAMINE HCL 25 MG PO CAPS: 50.0000 mg | ORAL_CAPSULE | Freq: Once | ORAL | Status: DC

## 2021-01-09 MED ORDER — ALBUTEROL SULFATE HFA 108 (90 BASE) MCG/ACT IN AERS
2.0000 | INHALATION_SPRAY | Freq: Once | RESPIRATORY_TRACT | Status: AC
  Administered 2021-01-09: 2 via RESPIRATORY_TRACT

## 2021-01-09 MED ORDER — LORAZEPAM 2 MG/ML IJ SOLN
1.0000 mg | Freq: Once | INTRAMUSCULAR | Status: AC
  Administered 2021-01-09: 1 mg via INTRAVENOUS
  Filled 2021-01-09: qty 1

## 2021-01-09 NOTE — Discharge Instructions (Addendum)
Follow up with your md later this week or next week for recheck

## 2021-01-09 NOTE — ED Provider Notes (Signed)
[facility] Emergency Department Provider Note MRN:  [id number]  Arrival date & time: 01/09/21     Chief Complaint   Chest Pain   History of Present Illness   Edwin Snyder is a 65 y.o. year-old male with a history of CAD presenting to the ED with chief complaint of chest pain.  Location: Central chest Duration: 24 hours Onset: Sudden Timing: Constant Description: Sharp stabbing Severity: Severe Exacerbating/Alleviating Factors: Improved with breathing treatments given by EMS Associated Symptoms: Shortness of breath Pertinent Negatives: Denies fever or cough, no abdominal pain, no numbness or weakness, no change to leg swelling  Additional History: None  Review of Systems  A complete 10 system review of systems was obtained and all systems are negative except as noted in the HPI and PMH.   Patient's Health History    Past Medical History:  Diagnosis Date   Arthritis    Brain tumor ([place])    CAD (coronary artery disease)    Obesity     Past Surgical History:  Procedure Laterality Date   FOOT SURGERY     SKIN LESION EXCISION      History reviewed. No pertinent family history.  Social History   Socioeconomic History   Marital status: Divorced    Spouse name: Not on file   Number of children: Not on file   Years of education: Not on file   Highest education level: Not on file  Occupational History   Not on file  Tobacco Use   Smoking status: Never   Smokeless tobacco: Never  Substance and Sexual Activity   Alcohol use: No   Drug use: No   Sexual activity: Not on file  Other Topics Concern   Not on file  Social History Narrative   Not on file   Social Determinants of Health   Financial Resource Strain: Not on file  Food Insecurity: Not on file  Transportation Needs: Not on file  Physical Activity: Not on file  Stress: Not on file  Social Connections: Not on file  Intimate Partner Violence: Not on file     Physical Exam    Vitals:   01/09/21 0230 01/09/21 0429  BP: (!) 175/102 (!) 181/101  Pulse: (!) 102 75  Resp: (!) 26 12  Temp:  98.1 F (36.7 C)  SpO2: 96% 93%    CONSTITUTIONAL: Chronically ill-appearing, NAD NEURO:  Alert and oriented x 3, no focal deficits EYES:  eyes equal and reactive ENT/NECK:  no LAD, no JVD CARDIO: Tachycardic rate, well-perfused, normal S1 and S2 PULM:  CTAB no wheezing or rhonchi GI/GU:  normal bowel sounds, non-distended, non-tender MSK/SPINE:  No gross deformities, bilateral lower extremity lymphedema SKIN:  no rash, atraumatic PSYCH:  Appropriate speech and behavior  *Additional and/or pertinent findings included in MDM below  Diagnostic and Interventional Summary    EKG Interpretation  Date/Time:  Monday January 08 2021 21:36:45 EDT Ventricular Rate:  90 PR Interval:  124 QRS Duration: 80 QT Interval:  382 QTC Calculation: 467 R Axis:   -26 Text Interpretation: Normal sinus rhythm Low voltage QRS Borderline ECG No significant change was found Confirmed by Gerlene Fee [phone]) on 01/08/2021 10:56:26 PM       Labs Reviewed  BASIC METABOLIC PANEL - Abnormal; Notable for the following components:      Result Value   Creatinine, Ser 1.32 (*)    Calcium 8.0 (*)    GFR, Estimated 59 (*)    Anion gap 16 (*)  All other components within normal limits  CBC - Abnormal; Notable for the following components:   RBC 3.53 (*)    MCV 111.6 (*)    MCH 37.7 (*)    Platelets 138 (*)    All other components within normal limits  D-DIMER, QUANTITATIVE - Abnormal; Notable for the following components:   D-Dimer, Quant 1.29 (*)    All other components within normal limits  BRAIN NATRIURETIC PEPTIDE - Abnormal; Notable for the following components:   B Natriuretic Peptide 169.0 (*)    All other components within normal limits  TROPONIN I (HIGH SENSITIVITY) - Abnormal; Notable for the following components:   Troponin I (High Sensitivity) 28 (*)    All other  components within normal limits  TROPONIN I (HIGH SENSITIVITY) - Abnormal; Notable for the following components:   Troponin I (High Sensitivity) 28 (*)    All other components within normal limits  RESP PANEL BY RT-PCR (FLU A&B, COVID) [id number]    CT Chest Wo Contrast  Final Result    DG Chest 2 View  Final Result    NM PULMONARY VENT AND PERF (V/Q Scan)    (Results Pending)    Medications  aspirin chewable tablet 324 mg (324 mg Oral Given 01/09/21 0024)  albuterol (VENTOLIN HFA) 108 (90 Base) MCG/ACT inhaler 2 puff (2 puffs Inhalation Given 01/09/21 0026)  HYDROmorphone (DILAUDID) injection 1 mg (1 mg Intravenous Given 01/09/21 0045)  methylPREDNISolone sodium succinate (SOLU-MEDROL) 125 mg/2 mL injection 125 mg (125 mg Intravenous Given 01/09/21 0051)  HYDROmorphone (DILAUDID) injection 1 mg (1 mg Intravenous Given 01/09/21 0220)  ketorolac (TORADOL) 15 MG/ML injection 15 mg (15 mg Intravenous Given 01/09/21 0220)  albuterol (VENTOLIN HFA) 108 (90 Base) MCG/ACT inhaler 2 puff (2 puffs Inhalation Given 01/09/21 0221)  LORazepam (ATIVAN) injection 1 mg (1 mg Intravenous Given 01/09/21 0434)     Procedures  /  Critical Care Procedures  ED Course and Medical Decision Making  I have reviewed the triage vital signs, the nursing notes, and pertinent available records from the EMR.  Listed above are laboratory and imaging tests that I personally ordered, reviewed, and interpreted and then considered in my medical decision making (see below for details).  Sharp chest pain with shortness of breath, tachycardia, lower extremity edema is unchanged but significant.  Considering ACS, PE, CHF exacerbation.  Chest x-ray is reassuring, labs overall normal.  Troponin is minimally elevated and flat upon repeat.  Patient is convinced that his lymphedema is due to an allergy to IV contrast and he is refusing CTA imaging at this time.  I tried to explain to him that this is not how the human body works, this is  not an allergy to contrast it is a chronic condition.  Still refusing CTA.  Will obtain Noncon CT to evaluate for any sign of effusion or edema.  May need VQ scan.  Also considering COPD exacerbation, giving a breathing treatment, steroids and will reassess.     Patient continues to complain of multiple things, chest pain, shortness of breath, bed is uncomfortable, nose is congested.  There may be a component of anxiety or some psychiatric undertones contributing to this emergency department visit.  Dilaudid not helping patient's pain, provided some Ativan and patient is more calm and resting.  Plan is for VQ scan first thing in the morning and then reassessment.  If patient continues to have pain, tachypnea, tachycardia, would likely need admission.  Signed out to oncoming provider at  shift change.  Barth Kirks. [place]onia Small, [place] [email or web address]  Final Clinical Impressions(s) / ED Diagnoses     ICD-10-CM   1. Chest pain, unspecified type  R07.9     2. Shortness of breath  R06.02       ED Discharge Orders     None        Discharge Instructions Discussed with and Provided to Patient:   Discharge Instructions   None       Maudie Flakes, MD 01/09/21 [phone]

## 2021-06-02 ENCOUNTER — Emergency Department (HOSPITAL_COMMUNITY): Payer: Medicare Other

## 2021-06-02 ENCOUNTER — Other Ambulatory Visit: Payer: Self-pay

## 2021-06-02 ENCOUNTER — Inpatient Hospital Stay (HOSPITAL_COMMUNITY)
Admission: EM | Admit: 2021-06-02 | Discharge: 2021-06-07 | DRG: 315 | Disposition: A | Discharge status: Skilled Nursing Facility | Payer: Medicare Other | Attending: Family Medicine | Admitting: Family Medicine

## 2021-06-02 ENCOUNTER — Encounter (HOSPITAL_COMMUNITY): Payer: Self-pay

## 2021-06-02 DIAGNOSIS — K219 Gastro-esophageal reflux disease without esophagitis: Secondary | ICD-10-CM | POA: Diagnosis present

## 2021-06-02 DIAGNOSIS — Z91041 Radiographic dye allergy status: Secondary | ICD-10-CM

## 2021-06-02 DIAGNOSIS — R5381 Other malaise: Secondary | ICD-10-CM | POA: Diagnosis present

## 2021-06-02 DIAGNOSIS — E878 Other disorders of electrolyte and fluid balance, not elsewhere classified: Secondary | ICD-10-CM | POA: Diagnosis present

## 2021-06-02 DIAGNOSIS — I9589 Other hypotension: Principal | ICD-10-CM | POA: Diagnosis present

## 2021-06-02 DIAGNOSIS — E860 Dehydration: Secondary | ICD-10-CM

## 2021-06-02 DIAGNOSIS — D649 Anemia, unspecified: Secondary | ICD-10-CM

## 2021-06-02 DIAGNOSIS — E785 Hyperlipidemia, unspecified: Secondary | ICD-10-CM

## 2021-06-02 DIAGNOSIS — I959 Hypotension, unspecified: Secondary | ICD-10-CM | POA: Diagnosis present

## 2021-06-02 DIAGNOSIS — E7800 Pure hypercholesterolemia, unspecified: Secondary | ICD-10-CM | POA: Diagnosis present

## 2021-06-02 DIAGNOSIS — R296 Repeated falls: Secondary | ICD-10-CM | POA: Diagnosis present

## 2021-06-02 DIAGNOSIS — E871 Hypo-osmolality and hyponatremia: Secondary | ICD-10-CM | POA: Diagnosis present

## 2021-06-02 DIAGNOSIS — R531 Weakness: Secondary | ICD-10-CM

## 2021-06-02 DIAGNOSIS — Z20822 Contact with and (suspected) exposure to covid-19: Secondary | ICD-10-CM | POA: Diagnosis present

## 2021-06-02 DIAGNOSIS — N179 Acute kidney failure, unspecified: Secondary | ICD-10-CM

## 2021-06-02 DIAGNOSIS — G4730 Sleep apnea, unspecified: Secondary | ICD-10-CM | POA: Diagnosis present

## 2021-06-02 DIAGNOSIS — Z6841 Body Mass Index (BMI) 40.0 and over, adult: Secondary | ICD-10-CM

## 2021-06-02 DIAGNOSIS — I2510 Atherosclerotic heart disease of native coronary artery without angina pectoris: Secondary | ICD-10-CM | POA: Diagnosis present

## 2021-06-02 DIAGNOSIS — E440 Moderate protein-calorie malnutrition: Secondary | ICD-10-CM | POA: Diagnosis present

## 2021-06-02 DIAGNOSIS — Z79899 Other long term (current) drug therapy: Secondary | ICD-10-CM

## 2021-06-02 DIAGNOSIS — N390 Urinary tract infection, site not specified: Secondary | ICD-10-CM | POA: Diagnosis present

## 2021-06-02 DIAGNOSIS — R079 Chest pain, unspecified: Secondary | ICD-10-CM | POA: Diagnosis not present

## 2021-06-02 DIAGNOSIS — E8809 Other disorders of plasma-protein metabolism, not elsewhere classified: Secondary | ICD-10-CM | POA: Diagnosis present

## 2021-06-02 DIAGNOSIS — Z28311 Partially vaccinated for covid-19: Secondary | ICD-10-CM

## 2021-06-02 DIAGNOSIS — I10 Essential (primary) hypertension: Secondary | ICD-10-CM | POA: Diagnosis present

## 2021-06-02 DIAGNOSIS — E876 Hypokalemia: Secondary | ICD-10-CM

## 2021-06-02 DIAGNOSIS — B952 Enterococcus as the cause of diseases classified elsewhere: Secondary | ICD-10-CM | POA: Diagnosis present

## 2021-06-02 LAB — CBC WITH DIFFERENTIAL/PLATELET
Abs Immature Granulocytes: 0.07 10*3/uL (ref 0.00–0.07)
Basophils Absolute: 0 10*3/uL (ref 0.0–0.1)
Basophils Relative: 0 %
Eosinophils Absolute: 0 10*3/uL (ref 0.0–0.5)
Eosinophils Relative: 0 %
HCT: 27.1 % — ABNORMAL LOW (ref 39.0–52.0)
Hemoglobin: 9.1 g/dL — ABNORMAL LOW (ref 13.0–17.0)
Immature Granulocytes: 1 %
Lymphocytes Relative: 11 %
Lymphs Abs: 1.1 10*3/uL (ref 0.7–4.0)
MCH: 32.9 pg (ref 26.0–34.0)
MCHC: 33.6 g/dL (ref 30.0–36.0)
MCV: 97.8 fL (ref 80.0–100.0)
Monocytes Absolute: 0.5 10*3/uL (ref 0.1–1.0)
Monocytes Relative: 5 %
Neutro Abs: 8.2 10*3/uL — ABNORMAL HIGH (ref 1.7–7.7)
Neutrophils Relative %: 83 %
Platelets: 243 10*3/uL (ref 150–400)
RBC: 2.77 MIL/uL — ABNORMAL LOW (ref 4.22–5.81)
RDW: 16.7 % — ABNORMAL HIGH (ref 11.5–15.5)
WBC: 10 10*3/uL (ref 4.0–10.5)
nRBC: 0 % (ref 0.0–0.2)

## 2021-06-02 LAB — COMPREHENSIVE METABOLIC PANEL
ALT: 10 U/L (ref 0–44)
AST: 11 U/L — ABNORMAL LOW (ref 15–41)
Albumin: 2.7 g/dL — ABNORMAL LOW (ref 3.5–5.0)
Alkaline Phosphatase: 186 U/L — ABNORMAL HIGH (ref 38–126)
Anion gap: 12 (ref 5–15)
BUN: 19 mg/dL (ref 8–23)
CO2: 25 mmol/L (ref 22–32)
Calcium: 8.1 mg/dL — ABNORMAL LOW (ref 8.9–10.3)
Chloride: 97 mmol/L — ABNORMAL LOW (ref 98–111)
Creatinine, Ser: 1.45 mg/dL — ABNORMAL HIGH (ref 0.61–1.24)
GFR, Estimated: 53 mL/min — ABNORMAL LOW (ref >60)
Glucose, Bld: 110 mg/dL — ABNORMAL HIGH (ref 70–99)
Potassium: 2.6 mmol/L — CL (ref 3.5–5.1)
Sodium: 134 mmol/L — ABNORMAL LOW (ref 135–145)
Total Bilirubin: 1.3 mg/dL — ABNORMAL HIGH (ref 0.3–1.2)
Total Protein: 6.6 g/dL (ref 6.5–8.1)

## 2021-06-02 LAB — RESP PANEL BY RT-PCR (FLU A&B, COVID) ARPGX2
Influenza A by PCR: NEGATIVE
Influenza B by PCR: NEGATIVE
SARS Coronavirus 2 by RT PCR: NEGATIVE

## 2021-06-02 LAB — POC OCCULT BLOOD, ED: Fecal Occult Bld: NEGATIVE

## 2021-06-02 LAB — MAGNESIUM: Magnesium: 1.2 mg/dL — ABNORMAL LOW (ref 1.7–2.4)

## 2021-06-02 LAB — CK: Total CK: 8 U/L — ABNORMAL LOW (ref 49–397)

## 2021-06-02 MED ORDER — POTASSIUM CHLORIDE 10 MEQ/100ML IV SOLN
10.0000 meq | Freq: Once | INTRAVENOUS | Status: AC
  Administered 2021-06-02: 10 meq via INTRAVENOUS
  Filled 2021-06-02: qty 100

## 2021-06-02 MED ORDER — SODIUM CHLORIDE 0.9 % IV BOLUS
1000.0000 mL | Freq: Once | INTRAVENOUS | Status: AC
  Administered 2021-06-02: 1000 mL via INTRAVENOUS

## 2021-06-02 MED ORDER — MAGNESIUM SULFATE 2 GM/50ML IV SOLN
2.0000 g | INTRAVENOUS | Status: AC
  Administered 2021-06-02: 2 g via INTRAVENOUS
  Filled 2021-06-02: qty 50

## 2021-06-02 NOTE — ED Provider Notes (Signed)
[facility] EMERGENCY DEPARTMENT Provider Note   CSN: [id number] Arrival date & time: 06/02/21  1925     History  Chief Complaint  Patient presents with   Leg Pain    Bilateral pain described as pins and needles   generalized weakness    Edwin Snyder is a 66 y.o. male.   Leg Pain  This patient is a 66 year old male with a history of hypertension, hypercholesterolemia, presents to the hospital today after having a fall 3 days ago where he fell try to get out of bed, he was too weak to get up, laid on the floor for quite some time until he eventually was able to crawl over to an adjoining wall and hollered for his neighbor who came over, called 911 and the paramedics came to help the patient get back into bed.  He has been in bed for the last 2 days unable to move or get up out of bed, urinating in a jar, has not had a bowel movement in 2 months.  States that he feels weak in the legs numb in the legs and endorses having prior foot fractures.  Review of the medical record shows that the patient was admitted to an outside hospital about 1 month ago, he had actually been admitted during that visit for a reported history of approximately 2 months of inability to walk due to bilateral leg pain and weakness, was found to have healing fractures of his left foot, had some mild acute kidney injury during that time, the patient wanted to go home at that time where he has been living since.  He has a niece and a couple of daughters that stop over to help him intermittently as does his neighbor.  The patient does report to me that he has not been able to eat or drink very much in the last week stating that he just does not have an appetite, there has been no diarrhea but does have occasional nausea and vomiting  Home Medications Prior to Admission medications   Medication Sig Start Date End Date Taking? Authorizing Provider  albuterol (PROVENTIL HFA;VENTOLIN HFA) 108 (90 Base) MCG/ACT inhaler  Inhale 2 puffs into the lungs every 4 (four) hours as needed for wheezing or shortness of breath.    [provider]  Buprenorphine HCl-Naloxone HCl 8-2 MG FILM Place 1 Film under the tongue 3 (three) times daily.    [provider]  lisinopril (ZESTRIL) 10 MG tablet Take 10 mg by mouth daily. 12/11/20   [provider]  omeprazole (PRILOSEC) 40 MG capsule Take 40 mg by mouth daily.    [provider]  predniSONE (DELTASONE) 20 MG tablet Take 2 tablets (40 mg total) by mouth daily. Patient not taking: Reported on 01/09/2021 10/25/19   Noemi Chapel, MD  simvastatin (ZOCOR) 40 MG tablet Take 40 mg by mouth daily.    [provider]      Allergies    Clindamycin/lincomycin, Penicillins, and Ivp dye [iodinated contrast media]    Review of Systems   Review of Systems  All other systems reviewed and are negative.  Physical Exam Updated Vital Signs BP 111/64 (BP Location: Left Arm)    Pulse 92    Temp 97.9 F (36.6 C) (Oral)    Resp 20    Ht 1.854 m (6\' 1" )    Wt (!) 186 kg    SpO2 100%    BMI 54.10 kg/m  Physical Exam Vitals and nursing note  reviewed.  Constitutional:      General: He is not in acute distress.    Appearance: He is well-developed.  HENT:     Head: Normocephalic and atraumatic.     Mouth/Throat:     Mouth: Mucous membranes are dry.     Pharynx: No oropharyngeal exudate.  Eyes:     General: No scleral icterus.       Right eye: No discharge.        Left eye: No discharge.     Conjunctiva/sclera: Conjunctivae normal.     Pupils: Pupils are equal, round, and reactive to light.  Neck:     Thyroid: No thyromegaly.     Vascular: No JVD.  Cardiovascular:     Rate and Rhythm: Normal rate and regular rhythm.     Heart sounds: Normal heart sounds. No murmur heard.   No friction rub. No gallop.  Pulmonary:     Effort: Pulmonary effort is normal. No respiratory distress.     Breath sounds: Normal breath sounds. No wheezing or rales.   Abdominal:     General: Bowel sounds are normal. There is no distension.     Palpations: Abdomen is soft. There is no mass.     Tenderness: There is no abdominal tenderness.  Musculoskeletal:        General: No tenderness. Normal range of motion.     Cervical back: Normal range of motion and neck supple.     Right lower leg: Edema present.     Left lower leg: Edema present.  Lymphadenopathy:     Cervical: No cervical adenopathy.  Skin:    General: Skin is warm and dry.     Findings: No erythema or rash.  Neurological:     Mental Status: He is alert.     Coordination: Coordination normal.     Comments: Minimally able to lift either leg off the bed by 1 or 2 inches, severe generalized weakness of the legs however the upper extremities have totally normal strength, cranial nerves III through XII appear to be normal, speech is normal, no facial droop.  Psychiatric:        Behavior: Behavior normal.    ED Results / Procedures / Treatments   Labs (all labs ordered are listed, but only abnormal results are displayed) Labs Reviewed  CBC WITH DIFFERENTIAL/PLATELET - Abnormal; Notable for the following components:      Result Value   RBC 2.77 (*)    Hemoglobin 9.1 (*)    HCT 27.1 (*)    RDW 16.7 (*)    Neutro Abs 8.2 (*)    All other components within normal limits  COMPREHENSIVE METABOLIC PANEL - Abnormal; Notable for the following components:   Sodium 134 (*)    Potassium 2.6 (*)    Chloride 97 (*)    Glucose, Bld 110 (*)    Creatinine, Ser 1.45 (*)    Calcium 8.1 (*)    Albumin 2.7 (*)    AST 11 (*)    Alkaline Phosphatase 186 (*)    Total Bilirubin 1.3 (*)    GFR, Estimated 53 (*)    All other components within normal limits  MAGNESIUM - Abnormal; Notable for the following components:   Magnesium 1.2 (*)    All other components within normal limits  CK - Abnormal; Notable for the following components:   Total CK 8 (*)    All other components within normal limits   URINE CULTURE  RESP PANEL  BY RT-PCR (FLU A&B, COVID) ARPGX2  RESP PANEL BY RT-PCR (FLU A&B, COVID) ARPGX2  URINALYSIS, ROUTINE W REFLEX MICROSCOPIC  POC OCCULT BLOOD, ED    EKG None  Radiology CT Head Wo Contrast  Result Date: 06/02/2021 CLINICAL DATA:  Bilateral weakness, falls. Bilateral leg pain and numbness. EXAM: CT HEAD WITHOUT CONTRAST TECHNIQUE: Contiguous axial images were obtained from the base of the skull through the vertex without intravenous contrast. RADIATION DOSE REDUCTION: This exam was performed according to the departmental dose-optimization program which includes automated exposure control, adjustment of the mA and/or kV according to patient size and/or use of iterative reconstruction technique. COMPARISON:  Head CT dated 10/25/2020 FINDINGS: Brain: Generalized parenchymal volume loss with commensurate dilatation of the ventricles and sulci. Mild chronic small vessel ischemic changes within the deep periventricular white matter regions bilaterally. No mass, hemorrhage, edema or other evidence of acute parenchymal abnormality. No extra-axial hemorrhage. Vascular: Chronic calcified atherosclerotic changes of the large vessels at the skull base. No unexpected hyperdense vessel. Skull: No acute findings. Surgical changes of a previous LEFT frontoparietal craniotomy. Sinuses/Orbits: Mild mucosal thickening within the ethmoid air cells and maxillary sinuses. Periorbital and retro-orbital soft tissues are unremarkable. Other: None. IMPRESSION: 1. No acute findings. No intracranial mass, hemorrhage or edema. 2. Mild chronic small vessel ischemic changes in the deep periventricular white matter regions. 3. Surgical changes of a previous LEFT frontoparietal craniotomy. 4. Mild paranasal sinus disease. Electronically Signed   By: Franki Cabot M.D.   On: 06/02/2021 20:07   DG Chest Port 1 View  Result Date: 06/02/2021 CLINICAL DATA:  Weakness EXAM: PORTABLE CHEST 1 VIEW COMPARISON:   01/08/2021 FINDINGS: Left base opacity, likely atelectasis. Right lung clear. Heart is normal size. No effusions or acute bony abnormality. IMPRESSION: Left base atelectasis. Electronically Signed   By: Rolm Baptise M.D.   On: 06/02/2021 20:15    Procedures .Critical Care Performed by: Noemi Chapel, MD Authorized by: Noemi Chapel, MD   Critical care provider statement:    Critical care time (minutes):  35   Critical care time was exclusive of:  Separately billable procedures and treating other patients and teaching time   Critical care was necessary to treat or prevent imminent or life-threatening deterioration of the following conditions:  Endocrine crisis and renal failure   Critical care was time spent personally by me on the following activities:  Development of treatment plan with patient or surrogate, discussions with consultants, evaluation of patient's response to treatment, examination of patient, ordering and review of laboratory studies, ordering and review of radiographic studies, ordering and performing treatments and interventions, pulse oximetry, re-evaluation of patient's condition, review of old charts and obtaining history from patient or surrogate   I assumed direction of critical care for this patient from another provider in my specialty: no     Care discussed with: admitting provider   Comments:          Medications Ordered in ED Medications  magnesium sulfate IVPB 2 g 50 mL (has no administration in time range)  potassium chloride 10 mEq in 100 mL IVPB (has no administration in time range)  sodium chloride 0.9 % bolus 1,000 mL (1,000 mLs Intravenous New Bag/Given 06/02/21 2043)    ED Course/ Medical Decision Making/ A&P                           Medical Decision Making Amount and/or Complexity of Data Reviewed Labs: ordered. Radiology:  ordered.  Risk Prescription drug management. Decision regarding hospitalization.   This patient presents to the ED for  concern of weakness, dehydration, this involves an extensive number of treatment options, and is a complaint that carries with it a high risk of complications and morbidity.  The differential diagnosis includes weakness, stroke, peripheral neuropathy, worsening renal failure, dehydration, infection   Co morbidities that complicate the patient evaluation  Severe morbid obesity, hypertension, chronic immobility   Additional history obtained:  Additional history obtained from electronic medical record External records from outside source obtained and reviewed including admissions to outside hospital including most recently 1 month ago at [facility]   Lab Tests:  I Ordered, and personally interpreted labs.  The pertinent results include: Severe hypokalemia, severe hypomagnesemia, acute kidney injury and dehydration   Imaging Studies ordered:  I ordered imaging studies including chest x-ray showing some atelectasis and CT scan of the brain showing no signs of acute neurologic damage or injury I independently visualized and interpreted imaging which showed as above I agree with the radiologist interpretation   Cardiac Monitoring:  The patient was maintained on a cardiac monitor.  I personally viewed and interpreted the cardiac monitored which showed an underlying rhythm of: Normal sinus rhythm   Medicines ordered and prescription drug management:  I ordered medication including magnesium sulfate IV, potassium IV, IV fluid for kidney failure as well as electrolyte disturbances which are quite severe and likely causative of his weakness Reevaluation of the patient after these medicines showed that the patient stayed the same I have reviewed the patients home medicines and have made adjustments as needed   Test Considered:  CT scan of the abdomen and pelvis but does not seem indicated at this time without significant abdominal pain   Critical  Interventions:  Rehydration, replacement of electrolytes   Consultations Obtained:  I requested consultation with the hospitalist,  and discussed lab and imaging findings as well as pertinent plan - they recommend: Admission to the hospital   Problem List / ED Course:  Replacement of potassium, magnesium, IV fluids, cardiac monitoring, consultation with hospitalist for admission   Reevaluation:  After the interventions noted above, I reevaluated the patient and found that they have :improved   Social Determinants of Health:  Severely weak, lives by himself, resource poor   Dispostion:  After consideration of the diagnostic results and the patients response to treatment, I feel that the patent would benefit from admission to the hospital.          Final Clinical Impression(s) / ED Diagnoses Final diagnoses:  Hypokalemia  Hypomagnesemia  Anemia, unspecified type  AKI (acute kidney injury) ([place])  Dehydration    Rx / DC Orders ED Discharge Orders     None         Noemi Chapel, MD 06/02/21 2144

## 2021-06-02 NOTE — ED Triage Notes (Addendum)
Pt presents to ED from home by [facility]. Pt c/o bilateral leg pain and numbness, from knees down. Pt describes feeling as pins and needles. Sensation check to bilateral LE are equal, pt reports pain when both shins and toes palpated. Pt reports falling 3 days ago, helped up by EMS, says he has been in the bed since then. Generalized weakness reported to bilateral LE.

## 2021-06-02 NOTE — ED Notes (Signed)
Pt asking for something to drink.  Informed not until testing comes back and cleared by EDP

## 2021-06-03 DIAGNOSIS — E785 Hyperlipidemia, unspecified: Secondary | ICD-10-CM

## 2021-06-03 DIAGNOSIS — I10 Essential (primary) hypertension: Secondary | ICD-10-CM | POA: Diagnosis present

## 2021-06-03 DIAGNOSIS — K219 Gastro-esophageal reflux disease without esophagitis: Secondary | ICD-10-CM | POA: Diagnosis not present

## 2021-06-03 DIAGNOSIS — R531 Weakness: Secondary | ICD-10-CM | POA: Diagnosis not present

## 2021-06-03 DIAGNOSIS — E876 Hypokalemia: Secondary | ICD-10-CM | POA: Diagnosis present

## 2021-06-03 DIAGNOSIS — N390 Urinary tract infection, site not specified: Secondary | ICD-10-CM | POA: Diagnosis present

## 2021-06-03 DIAGNOSIS — E440 Moderate protein-calorie malnutrition: Secondary | ICD-10-CM

## 2021-06-03 LAB — HIV ANTIBODY (ROUTINE TESTING W REFLEX): HIV Screen 4th Generation wRfx: NONREACTIVE

## 2021-06-03 LAB — URINALYSIS, MICROSCOPIC (REFLEX): WBC, UA: >50 WBC/hpf (ref 0–5)

## 2021-06-03 LAB — COMPREHENSIVE METABOLIC PANEL
ALT: 6 U/L (ref 0–44)
AST: 10 U/L — ABNORMAL LOW (ref 15–41)
Albumin: 2.4 g/dL — ABNORMAL LOW (ref 3.5–5.0)
Alkaline Phosphatase: 162 U/L — ABNORMAL HIGH (ref 38–126)
Anion gap: 9 (ref 5–15)
BUN: 19 mg/dL (ref 8–23)
CO2: 25 mmol/L (ref 22–32)
Calcium: 7.8 mg/dL — ABNORMAL LOW (ref 8.9–10.3)
Chloride: 100 mmol/L (ref 98–111)
Creatinine, Ser: 1.4 mg/dL — ABNORMAL HIGH (ref 0.61–1.24)
GFR, Estimated: 55 mL/min — ABNORMAL LOW (ref >60)
Glucose, Bld: 105 mg/dL — ABNORMAL HIGH (ref 70–99)
Potassium: 3.3 mmol/L — ABNORMAL LOW (ref 3.5–5.1)
Sodium: 134 mmol/L — ABNORMAL LOW (ref 135–145)
Total Bilirubin: 1.2 mg/dL (ref 0.3–1.2)
Total Protein: 5.8 g/dL — ABNORMAL LOW (ref 6.5–8.1)

## 2021-06-03 LAB — URINALYSIS, ROUTINE W REFLEX MICROSCOPIC
Glucose, UA: NEGATIVE mg/dL
Ketones, ur: NEGATIVE mg/dL
Nitrite: NEGATIVE
Specific Gravity, Urine: 1.025 (ref 1.005–1.030)
pH: 5.5 (ref 5.0–8.0)

## 2021-06-03 LAB — CBC WITH DIFFERENTIAL/PLATELET
Abs Immature Granulocytes: 0.07 10*3/uL (ref 0.00–0.07)
Basophils Absolute: 0 10*3/uL (ref 0.0–0.1)
Basophils Relative: 0 %
Eosinophils Absolute: 0 10*3/uL (ref 0.0–0.5)
Eosinophils Relative: 1 %
HCT: 25 % — ABNORMAL LOW (ref 39.0–52.0)
Hemoglobin: 8.3 g/dL — ABNORMAL LOW (ref 13.0–17.0)
Immature Granulocytes: 1 %
Lymphocytes Relative: 13 %
Lymphs Abs: 1.1 10*3/uL (ref 0.7–4.0)
MCH: 32.8 pg (ref 26.0–34.0)
MCHC: 33.2 g/dL (ref 30.0–36.0)
MCV: 98.8 fL (ref 80.0–100.0)
Monocytes Absolute: 0.4 10*3/uL (ref 0.1–1.0)
Monocytes Relative: 5 %
Neutro Abs: 7.1 10*3/uL (ref 1.7–7.7)
Neutrophils Relative %: 80 %
Platelets: 206 10*3/uL (ref 150–400)
RBC: 2.53 MIL/uL — ABNORMAL LOW (ref 4.22–5.81)
RDW: 16.7 % — ABNORMAL HIGH (ref 11.5–15.5)
WBC: 8.7 10*3/uL (ref 4.0–10.5)
nRBC: 0.2 % (ref 0.0–0.2)

## 2021-06-03 LAB — VITAMIN B12: Vitamin B-12: 826 pg/mL (ref 180–914)

## 2021-06-03 LAB — MAGNESIUM: Magnesium: 1.5 mg/dL — ABNORMAL LOW (ref 1.7–2.4)

## 2021-06-03 LAB — IRON AND TIBC
Iron: 88 ug/dL (ref 45–182)
Saturation Ratios: 38 % (ref 17.9–39.5)
TIBC: 235 ug/dL — ABNORMAL LOW (ref 250–450)
UIBC: 147 ug/dL

## 2021-06-03 LAB — TSH: TSH: 1.139 u[IU]/mL (ref 0.350–4.500)

## 2021-06-03 LAB — TROPONIN I (HIGH SENSITIVITY)
Troponin I (High Sensitivity): 17 ng/L (ref <18)
Troponin I (High Sensitivity): 14 ng/L (ref <18)

## 2021-06-03 MED ORDER — PANTOPRAZOLE SODIUM 40 MG PO TBEC
40.0000 mg | DELAYED_RELEASE_TABLET | Freq: Every day | ORAL | Status: DC
  Administered 2021-06-03 – 2021-06-07 (×5): 40 mg via ORAL
  Filled 2021-06-03 (×5): qty 1

## 2021-06-03 MED ORDER — POTASSIUM CHLORIDE 20 MEQ PO PACK
40.0000 meq | PACK | Freq: Once | ORAL | Status: AC
  Administered 2021-06-03: 40 meq via ORAL
  Filled 2021-06-03: qty 2

## 2021-06-03 MED ORDER — LISINOPRIL 10 MG PO TABS
10.0000 mg | ORAL_TABLET | Freq: Every day | ORAL | Status: DC
  Administered 2021-06-03: 10 mg via ORAL
  Filled 2021-06-03: qty 1

## 2021-06-03 MED ORDER — ONDANSETRON HCL 4 MG PO TABS: 4.0000 mg | ORAL_TABLET | Freq: Four times a day (QID) | ORAL | Status: DC | PRN

## 2021-06-03 MED ORDER — NYSTATIN 100000 UNIT/GM EX CREA
TOPICAL_CREAM | Freq: Two times a day (BID) | CUTANEOUS | Status: DC
  Filled 2021-06-03 (×2): qty 15
  Filled 2021-06-03: qty 30
  Filled 2021-06-03: qty 15

## 2021-06-03 MED ORDER — OXYCODONE HCL 5 MG PO TABS: 5.0000 mg | ORAL_TABLET | ORAL | Status: DC | PRN

## 2021-06-03 MED ORDER — ASPIRIN 325 MG PO TABS
325.0000 mg | ORAL_TABLET | Freq: Once | ORAL | Status: AC
  Administered 2021-06-03: 325 mg via ORAL
  Filled 2021-06-03: qty 1

## 2021-06-03 MED ORDER — ONDANSETRON HCL 4 MG/2ML IJ SOLN: 4.0000 mg | Freq: Four times a day (QID) | INTRAMUSCULAR | Status: DC | PRN

## 2021-06-03 MED ORDER — THIAMINE HCL 100 MG PO TABS
100.0000 mg | ORAL_TABLET | Freq: Every day | ORAL | Status: DC
  Administered 2021-06-03 – 2021-06-07 (×5): 100 mg via ORAL
  Filled 2021-06-03 (×4): qty 1

## 2021-06-03 MED ORDER — LACTATED RINGERS IV BOLUS
250.0000 mL | Freq: Once | INTRAVENOUS | Status: AC
  Administered 2021-06-03: 250 mL via INTRAVENOUS

## 2021-06-03 MED ORDER — HEPARIN SODIUM (PORCINE) 5000 UNIT/ML IJ SOLN
5000.0000 [IU] | Freq: Three times a day (TID) | INTRAMUSCULAR | Status: DC
  Administered 2021-06-03 – 2021-06-07 (×13): 5000 [IU] via SUBCUTANEOUS
  Filled 2021-06-03 (×13): qty 1

## 2021-06-03 MED ORDER — MORPHINE SULFATE (PF) 2 MG/ML IV SOLN: 2.0000 mg | INTRAVENOUS | Status: DC | PRN

## 2021-06-03 MED ORDER — LACTATED RINGERS IV BOLUS
500.0000 mL | Freq: Once | INTRAVENOUS | Status: AC
  Administered 2021-06-03: 500 mL via INTRAVENOUS

## 2021-06-03 MED ORDER — POLYETHYLENE GLYCOL 3350 17 G PO PACK
17.0000 g | PACK | Freq: Every day | ORAL | Status: DC
  Administered 2021-06-03 – 2021-06-07 (×5): 17 g via ORAL
  Filled 2021-06-03 (×5): qty 1

## 2021-06-03 MED ORDER — OXYCODONE HCL 5 MG PO TABS
5.0000 mg | ORAL_TABLET | Freq: Four times a day (QID) | ORAL | Status: DC | PRN
  Administered 2021-06-03 – 2021-06-06 (×8): 5 mg via ORAL
  Filled 2021-06-03 (×11): qty 1

## 2021-06-03 MED ORDER — SENNOSIDES-DOCUSATE SODIUM 8.6-50 MG PO TABS
1.0000 | ORAL_TABLET | Freq: Two times a day (BID) | ORAL | Status: DC
  Administered 2021-06-03 – 2021-06-07 (×9): 1 via ORAL
  Filled 2021-06-03 (×9): qty 1

## 2021-06-03 MED ORDER — NITROGLYCERIN 0.4 MG SL SUBL
0.4000 mg | SUBLINGUAL_TABLET | SUBLINGUAL | Status: AC | PRN
  Administered 2021-06-03 – 2021-06-06 (×4): 0.4 mg via SUBLINGUAL
  Filled 2021-06-03 (×2): qty 1

## 2021-06-03 MED ORDER — SIMVASTATIN 20 MG PO TABS
40.0000 mg | ORAL_TABLET | Freq: Every day | ORAL | Status: DC
  Administered 2021-06-03 – 2021-06-04 (×2): 40 mg via ORAL
  Filled 2021-06-03 (×2): qty 2

## 2021-06-03 MED ORDER — ACETAMINOPHEN 650 MG RE SUPP: 650.0000 mg | Freq: Four times a day (QID) | RECTAL | Status: DC | PRN

## 2021-06-03 MED ORDER — MORPHINE SULFATE (PF) 2 MG/ML IV SOLN
2.0000 mg | INTRAVENOUS | Status: DC | PRN
  Administered 2021-06-03 – 2021-06-04 (×3): 2 mg via INTRAVENOUS
  Filled 2021-06-03 (×3): qty 1

## 2021-06-03 MED ORDER — ACETAMINOPHEN 325 MG PO TABS
650.0000 mg | ORAL_TABLET | Freq: Four times a day (QID) | ORAL | Status: DC | PRN
  Administered 2021-06-05 (×2): 650 mg via ORAL
  Filled 2021-06-03: qty 2

## 2021-06-03 NOTE — Assessment & Plan Note (Addendum)
Discontinued lisinopril due to persistent hypotension, -Increase midodrine to 10 mg 3 times daily due to persistently low BP

## 2021-06-03 NOTE — Progress Notes (Signed)
°   06/03/21 1800  Vitals  BP (!) 92/58  MEWS COLOR  MEWS Score Color Green  Pain Assessment  Pain Scale 0-10  Pain Score 8  Pain Type Acute pain  Pain Location Epigastric  MEWS Score  MEWS Temp 0  MEWS Systolic 1  MEWS Pulse 0  MEWS RR 0  MEWS LOC 0  MEWS Score 1

## 2021-06-03 NOTE — Assessment & Plan Note (Addendum)
-  On 06/06/2021 magnesium was low at 1.3, replaced iv

## 2021-06-03 NOTE — Progress Notes (Signed)
°   06/03/21 1439  Vitals  Temp 98 F (36.7 C)  BP (!) 83/66  MAP (mmHg) 73  BP Location Left Arm  BP Method Automatic  Patient Position (if appropriate) Lying  Pulse Rate 96  Pulse Rate Source Monitor  Resp 17  MEWS COLOR  MEWS Score Color Green  Oxygen Therapy  SpO2 99 %  MEWS Score  MEWS Temp 0  MEWS Systolic 1  MEWS Pulse 0  MEWS RR 0  MEWS LOC 0  MEWS Score 1

## 2021-06-03 NOTE — Progress Notes (Signed)
VS 5 minutes post Nitro administration , Patient denies headache, states his chest pain has gotten better and that it provided relief . EKG obtained and placed in medical record .   06/03/21 1855  Vitals  BP (!) 84/69  MAP (mmHg) 75  BP Method Automatic  Pulse Rate 93  Pulse Rate Source Monitor  MEWS COLOR  MEWS Score Color Green  Oxygen Therapy  SpO2 100 %  MEWS Score  MEWS Temp 0  MEWS Systolic 1  MEWS Pulse 0  MEWS RR 0  MEWS LOC 0  MEWS Score 1

## 2021-06-03 NOTE — Progress Notes (Signed)
Boilus infusing at this time 543ml ,  BP 92/58 Reassessed after yellow mews Patient c/o chest pain new order for ASA , given PO , EKG obtained by RT ,. Holding off on nitro d/t patient states his chest pain has resolved and BP is already soft. T98.1-rr 20-Pulse 88- Spo2-100
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
## 2021-06-03 NOTE — Assessment & Plan Note (Signed)
Continue statin and educated about low-cholesterol foods

## 2021-06-03 NOTE — Assessment & Plan Note (Addendum)
Morbid Obesity- -Low calorie diet, portion control and increase physical activity discussed with patient -Body mass index is 42.35 kg/m.

## 2021-06-03 NOTE — Progress Notes (Signed)
Spoke with daughter  DAYLON, LAFAVOR Daughter     [phone]   And gave updates on father. Will continue to monitor patient.

## 2021-06-03 NOTE — Assessment & Plan Note (Signed)
Continue PPI ?
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
## 2021-06-03 NOTE — Assessment & Plan Note (Addendum)
-  Noted for generalized weaknesses with severe debility, falls, resulting in lower extremity ankle fractures in the past -Per patient cannot ambulate at all at this time, needs assistant with all ADLs His condition has worsened over past 4 months  Patient reports decreased appetite and poor p.o. intake -which is consistent with electrolyte derangements and hypoalbuminemia--> this is likely contributing to his generalized weakness Encourage nutrient dense food choices, replace electrolytes Deconditioning is also likely a contributor-PT eval appreciated, social work and DSS consult appreciated, patient is now agreeable to go to SNF rehab No focal deficit on exam --> symptoms have been intermittent for 4 months-

## 2021-06-03 NOTE — Assessment & Plan Note (Addendum)
-  Urine culture with Enterococcus faecalis - Stopped Cipro, treated with fosfomycin

## 2021-06-03 NOTE — Progress Notes (Signed)
Pt sitting in bed with lights on and looking in the direction of the TV. Pt stated that his pain is 10 of 10 in his legs and back and feet, Requested prn morphine (PF) 2 MG/ML injection 2 mg    Will continue to monitor. Pt able to make needs known.

## 2021-06-03 NOTE — Assessment & Plan Note (Addendum)
Electrolyte abnormalities may be contributing to her generalized -Potassium normalized -On 06/06/2021 magnesium was low at 1.3, replaced iv

## 2021-06-03 NOTE — Progress Notes (Signed)
New IV placed to RFA , Bolus 500 infusing at this time. Patient c/o midsternal/chest pain , states it is a throbbing feeling, given PRN oxy d/t c/o pain in back and legs as well. He states that he had the same feeling last night and that 9it resolved Notified Dr, [facility]
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
## 2021-06-03 NOTE — Progress Notes (Signed)
Patient received about 100cc of bolus. IV will not flush and no blood return noted. Will place another IV  And continue bolus   06/03/21 1657  Vitals  BP (!) 88/54  MAP (mmHg) 66  BP Method Automatic  Pulse Rate 94  MEWS COLOR  MEWS Score Color Green  MEWS Score  MEWS Temp 0  MEWS Systolic 1  MEWS Pulse 0  MEWS RR 0  MEWS LOC 0  MEWS Score 1

## 2021-06-03 NOTE — Assessment & Plan Note (Addendum)
Secondary to poor food choices and poor p.o. intake Encourage nutrient dense food choices -Balanced diet recommended

## 2021-06-04 DIAGNOSIS — I2510 Atherosclerotic heart disease of native coronary artery without angina pectoris: Secondary | ICD-10-CM | POA: Diagnosis present

## 2021-06-04 DIAGNOSIS — R5381 Other malaise: Secondary | ICD-10-CM | POA: Diagnosis present

## 2021-06-04 DIAGNOSIS — R079 Chest pain, unspecified: Secondary | ICD-10-CM | POA: Diagnosis not present

## 2021-06-04 DIAGNOSIS — R531 Weakness: Secondary | ICD-10-CM | POA: Diagnosis not present

## 2021-06-04 DIAGNOSIS — I959 Hypotension, unspecified: Secondary | ICD-10-CM | POA: Diagnosis present

## 2021-06-04 DIAGNOSIS — E8809 Other disorders of plasma-protein metabolism, not elsewhere classified: Secondary | ICD-10-CM | POA: Diagnosis present

## 2021-06-04 DIAGNOSIS — Z28311 Partially vaccinated for covid-19: Secondary | ICD-10-CM | POA: Diagnosis not present

## 2021-06-04 DIAGNOSIS — N390 Urinary tract infection, site not specified: Secondary | ICD-10-CM | POA: Diagnosis present

## 2021-06-04 DIAGNOSIS — G4730 Sleep apnea, unspecified: Secondary | ICD-10-CM | POA: Diagnosis present

## 2021-06-04 DIAGNOSIS — I10 Essential (primary) hypertension: Secondary | ICD-10-CM | POA: Diagnosis present

## 2021-06-04 DIAGNOSIS — Z6841 Body Mass Index (BMI) 40.0 and over, adult: Secondary | ICD-10-CM | POA: Diagnosis not present

## 2021-06-04 DIAGNOSIS — N179 Acute kidney failure, unspecified: Secondary | ICD-10-CM | POA: Diagnosis present

## 2021-06-04 DIAGNOSIS — Z79899 Other long term (current) drug therapy: Secondary | ICD-10-CM | POA: Diagnosis not present

## 2021-06-04 DIAGNOSIS — E878 Other disorders of electrolyte and fluid balance, not elsewhere classified: Secondary | ICD-10-CM | POA: Diagnosis present

## 2021-06-04 DIAGNOSIS — K219 Gastro-esophageal reflux disease without esophagitis: Secondary | ICD-10-CM | POA: Diagnosis present

## 2021-06-04 DIAGNOSIS — E440 Moderate protein-calorie malnutrition: Secondary | ICD-10-CM | POA: Diagnosis present

## 2021-06-04 DIAGNOSIS — I9589 Other hypotension: Secondary | ICD-10-CM | POA: Diagnosis present

## 2021-06-04 DIAGNOSIS — E876 Hypokalemia: Secondary | ICD-10-CM | POA: Diagnosis present

## 2021-06-04 DIAGNOSIS — E871 Hypo-osmolality and hyponatremia: Secondary | ICD-10-CM | POA: Diagnosis present

## 2021-06-04 DIAGNOSIS — Z20822 Contact with and (suspected) exposure to covid-19: Secondary | ICD-10-CM | POA: Diagnosis present

## 2021-06-04 DIAGNOSIS — E7800 Pure hypercholesterolemia, unspecified: Secondary | ICD-10-CM | POA: Diagnosis present

## 2021-06-04 DIAGNOSIS — R296 Repeated falls: Secondary | ICD-10-CM | POA: Diagnosis present

## 2021-06-04 DIAGNOSIS — B952 Enterococcus as the cause of diseases classified elsewhere: Secondary | ICD-10-CM | POA: Diagnosis present

## 2021-06-04 DIAGNOSIS — E860 Dehydration: Secondary | ICD-10-CM | POA: Diagnosis present

## 2021-06-04 DIAGNOSIS — Z91041 Radiographic dye allergy status: Secondary | ICD-10-CM | POA: Diagnosis not present

## 2021-06-04 MED ORDER — NYSTATIN 100000 UNIT/GM EX CREA: TOPICAL_CREAM | Freq: Two times a day (BID) | CUTANEOUS | 0 refills | Status: DC

## 2021-06-04 MED ORDER — MIDODRINE HCL 5 MG PO TABS: 5.0000 mg | ORAL_TABLET | Freq: Three times a day (TID) | ORAL | 2 refills | Status: DC

## 2021-06-04 MED ORDER — MIDODRINE HCL 5 MG PO TABS
5.0000 mg | ORAL_TABLET | Freq: Three times a day (TID) | ORAL | Status: DC
  Administered 2021-06-04 – 2021-06-05 (×4): 5 mg via ORAL
  Filled 2021-06-04 (×4): qty 1

## 2021-06-04 MED ORDER — LACTINEX PO CHEW: 1.0000 | CHEWABLE_TABLET | Freq: Three times a day (TID) | ORAL | 0 refills | Status: DC

## 2021-06-04 MED ORDER — SENNOSIDES-DOCUSATE SODIUM 8.6-50 MG PO TABS: 1.0000 | ORAL_TABLET | Freq: Two times a day (BID) | ORAL | 1 refills | Status: DC

## 2021-06-04 MED ORDER — CIPROFLOXACIN HCL 500 MG PO TABS: 500.0000 mg | ORAL_TABLET | Freq: Two times a day (BID) | ORAL | 0 refills | Status: DC

## 2021-06-04 MED ORDER — CIPROFLOXACIN HCL 250 MG PO TABS
500.0000 mg | ORAL_TABLET | Freq: Two times a day (BID) | ORAL | Status: DC
  Administered 2021-06-04 – 2021-06-05 (×3): 500 mg via ORAL
  Filled 2021-06-04 (×3): qty 2
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
## 2021-06-04 NOTE — Assessment & Plan Note (Addendum)
On 06/06/2021 patient developed chest pains again -Serial troponin negative -Severe EKG negative for any acute changes -Patient has again ruled out for ACS by cardiac enzymes and EKG -D-dimer is 1.39 -CTA chest requested after prednisone and Benadryl prep due to contrast allergy -Echo with EF of 60 to 65%, no regional wall motion abnormalities, no diastolic dysfunction, no AS, no MS

## 2021-06-04 NOTE — Care Management Obs Status (Signed)
[facility] NOTIFICATION   Patient Details  Name: Edwin Snyder MRN: [id number] Date of Birth: 1956-01-13   Medicare Observation Status Notification Given:  Yes    Tommy Medal 06/04/2021, 9:06 AM
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
## 2021-06-04 NOTE — TOC Transition Note (Signed)
Transition of Care [facility]) - CM/SW Discharge Note   Patient Details  Name: Edwin Snyder MRN: [id number] Date of Birth: 02-28-1956  Transition of Care [facility]) CM/SW Contact:  Salome Arnt, LCSW Phone Number: 06/04/2021, 11:52 AM   Clinical Narrative: Pt d/c today and will return home with home health. Pt refusing SNF. LCSW discussed home health with pt's niece who helps him at home and she is also agreeable. Pt has no preference on agency. Referred and accepted by Tommi Rumps with [facility] for HHPT, OT, RN, SW, and aide. Orders in. Cory aware of d/c today. Pt states he will need EMS transport. RN notified for floor to arrange.        Final next level of care: Home w Home Health Services Barriers to Discharge: Barriers Resolved   Patient Goals and CMS Choice Patient states their goals for this hospitalization and ongoing recovery are:: return home   Choice offered to / list presented to : Patient  Discharge Placement                  Name of family member notified: pt only Patient and family notified of of transfer: 06/04/21  Discharge Plan and Services                          HH Arranged: RN, PT, OT, Social Work, Nurse's Aide [place] Agency: [place] Date [place]: 06/04/21 Time [place]: 26 Representative spoke with at [place]: [place] ([place]) Interventions     Readmission Risk Interventions No flowsheet data found.

## 2021-06-04 NOTE — Plan of Care (Signed)
°  Problem: Education: Goal: Knowledge of General Education information will improve Description: Including pain rating scale, medication(s)/side effects and non-pharmacologic comfort measures Outcome: Progressing   Problem: Health Behavior/Discharge Planning: Goal: Ability to manage health-related needs will improve Outcome: Progressing   Problem: Clinical Measurements: Goal: Ability to maintain clinical measurements within normal limits will improve Outcome: Progressing Goal: Will remain free from infection Outcome: Progressing Goal: Diagnostic test results will improve Outcome: Progressing Goal: Respiratory complications will improve Outcome: Progressing Goal: Cardiovascular complication will be avoided Outcome: Progressing   Problem: Nutrition: Goal: Adequate nutrition will be maintained Outcome: Progressing   

## 2021-06-04 NOTE — Plan of Care (Signed)
°  Problem: Acute Rehab PT Goals(only PT should resolve) Goal: Pt Will Go Supine/Side To Sit Outcome: Progressing Flowsheets (Taken 06/04/2021 1355) Pt will go Supine/Side to Sit:  with minimal assist  with moderate assist Goal: Patient Will Transfer Sit To/From Stand Outcome: Progressing Flowsheets (Taken 06/04/2021 1355) Patient will transfer sit to/from stand: with moderate assist Goal: Pt Will Transfer Bed To Chair/Chair To Bed Outcome: Progressing Flowsheets (Taken 06/04/2021 1355) Pt will Transfer Bed to Chair/Chair to Bed: with mod assist Goal: Pt Will Ambulate Outcome: Progressing Flowsheets (Taken 06/04/2021 1355) Pt will Ambulate:  10 feet  with moderate assist  with standard walker   1:56 PM, 06/04/21 Lonell Grandchild, MPT Physical Therapist with [facility] [phone] office [phone] mobile phone

## 2021-06-04 NOTE — Assessment & Plan Note (Addendum)
Persistent hypotension -Patient ruled out for sepsis -Patient may be developing autonomic disorder--this could contribute to his generalized weakness including  -Required IV fluid boluses on 06/05/2021 due to persistently low BP, patient also required additional IV fluid boluses on 2 12/16/2021 C/n  midodrine to 10 mg 3 times daily - a.m. cortisol levels is Not low

## 2021-06-05 LAB — URINE CULTURE: Culture: >=100000 — AB

## 2021-06-05 MED ORDER — FOSFOMYCIN TROMETHAMINE 3 G PO PACK
3.0000 g | PACK | ORAL | Status: DC
  Administered 2021-06-05: 3 g via ORAL
  Filled 2021-06-05: qty 3

## 2021-06-05 MED ORDER — MIDODRINE HCL 5 MG PO TABS
10.0000 mg | ORAL_TABLET | Freq: Three times a day (TID) | ORAL | Status: DC
  Administered 2021-06-05 – 2021-06-07 (×8): 10 mg via ORAL
  Filled 2021-06-05 (×8): qty 2

## 2021-06-05 NOTE — Progress Notes (Signed)
Patient niece Butch Penny at bedside.

## 2021-06-05 NOTE — Plan of Care (Signed)

## 2021-06-05 NOTE — Progress Notes (Signed)
Patient has tried x5 to have a bm today. Continues to state he hasn't had a bm in a month. Explained he had one on the fifth. States "I don't remember". Patient continues to pick at his skin as well. Just found a new spot on his stomach. He stated that he doesn't remember that.

## 2021-06-05 NOTE — TOC Transition Note (Signed)
Transition of Care [facility]) - CM/SW Discharge Note   Patient Details  Name: ENDER RORKE MRN: [id number] Date of Birth: 1955/05/09  Transition of Care [facility]) CM/SW Contact:  Boneta Lucks, RN Phone Number: 06/05/2021, 12:34 PM   Clinical Narrative:  Patient still here due to no EMS transportation yesterday.  Colletta [place] from [place] visiting patient here today. Patient is going back and forth about rehab, he finally agrees to discharge to rehab for a short term stay.  CM will send out FL2 and updated DSS with bed offers and start INS AUTH.    Final next level of care: Home/Self Care Barriers to Discharge: No [facility] will accept this patient  Patient Goals and CMS Choice Patient states their goals for this hospitalization and ongoing recovery are:: return home   Choice offered to / list presented to : Patient  Discharge Placement           Patient to be transferred to facility by: EMS Name of family member notified: pt only Patient and family notified of of transfer: 06/05/21  Discharge Plan and Services    HH Arranged: RN, PT, OT, Social Work, Nurse's Aide [place] Agency: [place] Date [place]: 06/04/21 Time [place]: [id number] Representative spoke with at [place]: Tommi Rumps

## 2021-06-05 NOTE — Progress Notes (Signed)
Patient's code status discussed with Dr. Joesph Fillers with patient and family. Patient will be made DNR. IV restarted, fluids infusing. Patient argumentative over his home situation, family talking with him at this time via phone.
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
## 2021-06-05 NOTE — Care Management Important Message (Signed)
Important Message  Patient Details  Name: Edwin Snyder MRN: [id number] Date of Birth: 1955-12-08   Medicare Important Message Given:  Yes     Tommy Medal 06/05/2021, 2:28 PM

## 2021-06-05 NOTE — Progress Notes (Signed)
Patient has many scabs on his arms. Continues to pick at them, I have applied multiple mepilexes to bilat arms. Patient has "no recollection" of doing it.

## 2021-06-05 NOTE — Progress Notes (Signed)
°   06/05/21 0939  Assess: MEWS Score  Temp 98 F (36.7 C)  BP (!) 74/38  Pulse Rate 98  Resp 19  Level of Consciousness Alert  SpO2 97 %  O2 Device Nasal Cannula  O2 Flow Rate (L/min) 2 L/min  Assess: MEWS Score  MEWS Temp 0  MEWS Systolic 2  MEWS Pulse 0  MEWS RR 0  MEWS LOC 0  MEWS Score 2  MEWS Score Color Yellow  Assess: if the MEWS score is Yellow or Red  Were vital signs taken at a resting state? Yes  Focused Assessment Change from prior assessment (see assessment flowsheet)  Early Detection of Sepsis Score *See Row Information* Low  MEWS guidelines implemented *See Row Information* Yes  Treat  MEWS Interventions Administered scheduled meds/treatments  Pain Scale 0-10  Pain Score 5  Take Vital Signs  Increase Vital Sign Frequency  Yellow: Q 2hr X 2 then Q 4hr X 2, if remains yellow, continue Q 4hrs  Escalate  MEWS: Escalate Yellow: discuss with charge nurse/RN and consider discussing with provider and RRT  Notify: Charge Nurse/RN  Name of Charge Nurse/RN Notified Audrea Muscat, Rn  Date Charge Nurse/RN Notified 06/05/21  Time Charge Nurse/RN Notified [id number]  Notify: Provider  Provider Name/Title Dr Joesph Fillers  Date Provider Notified 06/05/21  Time Provider Notified [phone]  Notification Type Page  Notification Reason Change in status  Provider response At bedside  Date of Provider Response 06/05/21  Time of Provider Response 0950   [place] started, Code status discussed as patient refusing SNF.

## 2021-06-05 NOTE — [place] FL2 (Signed)
[place] MEDICAID FL2 LEVEL OF CARE SCREENING TOOL     IDENTIFICATION  Patient Name: Edwin Snyder Birthdate: January 24, 1956 Sex: male Admission Date (Current Location): 06/02/2021  [facility] and [place] Number:  [employer] and Address:  [facility] [address] [place]      Provider Number: [id number]  Attending Physician Name and Address:  Roxan Hockey, MD  Relative Name and Phone Number:  Carolynn Sayers Niece     [phone]    Current Level of Care: Hospital Recommended Level of Care: [place] Prior Approval Number:    Date Approved/Denied:   PASRR Number: [id number]  Discharge Plan: SNF    Current Diagnoses: Patient Active Problem List   Diagnosis Date Noted   Chest pain 06/04/2021   Hypotension 06/04/2021   Hypokalemia 06/03/2021   Hypomagnesemia 06/03/2021   Protein-calorie malnutrition, moderate ([place]) 06/03/2021   Essential hypertension 06/03/2021   GERD (gastroesophageal reflux disease) 06/03/2021   Hyperlipidemia 06/03/2021   UTI (urinary tract infection) 06/03/2021   Obesity, Class III, BMI 40-49.9 (morbid obesity) ([place]) 06/03/2021   Generalized weakness 06/02/2021    Orientation RESPIRATION BLADDER Height & Weight     Self, Time, Situation, Place  O2 (2L currently) Incontinent Weight: (!) 145.6 kg Height:  6\' 1"  (185.4 cm)  BEHAVIORAL SYMPTOMS/MOOD NEUROLOGICAL BOWEL NUTRITION STATUS      Incontinent    AMBULATORY STATUS COMMUNICATION OF NEEDS Skin   Extensive Assist Verbally Normal                       Personal Care Assistance Level of Assistance  Bathing, Feeding, Dressing Bathing Assistance: Maximum assistance Feeding assistance: Limited assistance Dressing Assistance: Maximum assistance     Functional Limitations Info  Sight, Hearing, Speech Sight Info: Adequate Hearing Info: Adequate Speech Info: Adequate    SPECIAL CARE FACTORS FREQUENCY  PT (By licensed PT)     PT  Frequency: 5 times a week              Contractures Contractures Info: Not present    Additional Factors Info  Code Status, Allergies Code Status Info: FULL Allergies Info: Clindamycin, Penicillins, IV Dye/Contrast           Current Medications (06/05/2021):  This is the current hospital active medication list Current Facility-Administered Medications  Medication Dose Route Frequency Provider Last Rate Last Admin   acetaminophen (TYLENOL) tablet 650 mg  650 mg Oral Q6H PRN Zierle-Ghosh, Asia B, DO   650 mg at 06/05/21 1249   Or   acetaminophen (TYLENOL) suppository 650 mg  650 mg Rectal Q6H PRN Zierle-Ghosh, Asia B, DO       fosfomycin (MONUROL) packet 3 g  3 g Oral Q72H Emokpae, Courage, MD   3 g at 06/05/21 1248   heparin injection 5,000 Units  5,000 Units Subcutaneous Q8H Zierle-Ghosh, Asia B, DO   5,000 Units at 06/05/21 0631   midodrine (PROAMATINE) tablet 10 mg  10 mg Oral TID WC Emokpae, Courage, MD   10 mg at 06/05/21 1248   nitroGLYCERIN (NITROSTAT) SL tablet 0.4 mg  0.4 mg Sublingual Q5 min PRN Shahmehdi, Seyed A, MD   0.4 mg at 06/03/21 0865   nystatin cream (MYCOSTATIN)   Topical BID Skipper Cliche A, MD   Given at 06/05/21 1249   ondansetron (ZOFRAN) tablet 4 mg  4 mg Oral Q6H PRN Zierle-Ghosh, Asia B, DO       Or   ondansetron (  ZOFRAN) injection 4 mg  4 mg Intravenous Q6H PRN Zierle-Ghosh, Asia B, DO       oxyCODONE (Oxy IR/ROXICODONE) immediate release tablet 5 mg  5 mg Oral Q6H PRN Shahmehdi, Seyed A, MD   5 mg at 06/05/21 0320   pantoprazole (PROTONIX) EC tablet 40 mg  40 mg Oral Daily Zierle-Ghosh, Asia B, DO   40 mg at 06/05/21 1249   polyethylene glycol (MIRALAX / GLYCOLAX) packet 17 g  17 g Oral Daily Shahmehdi, Seyed A, MD   17 g at 06/05/21 1249   senna-docusate (Senokot-S) tablet 1 tablet  1 tablet Oral BID Skipper Cliche A, MD   1 tablet at 06/05/21 1249   thiamine tablet 100 mg  100 mg Oral Daily Zierle-Ghosh, Asia B, DO   100 mg at 06/05/21 1250      Discharge Medications: Please see discharge summary for a list of discharge medications.  Relevant Imaging Results:  Relevant Lab Results:   Additional Information SS# [id number]  Boneta Lucks, RN

## 2021-06-05 NOTE — Progress Notes (Signed)
Patient sleeping on round. Vitals stable.

## 2021-06-06 ENCOUNTER — Inpatient Hospital Stay (HOSPITAL_COMMUNITY): Payer: Medicare Other

## 2021-06-06 ENCOUNTER — Other Ambulatory Visit (HOSPITAL_COMMUNITY): Payer: Self-pay | Admitting: *Deleted

## 2021-06-06 DIAGNOSIS — R079 Chest pain, unspecified: Secondary | ICD-10-CM

## 2021-06-06 LAB — CBC
HCT: 25.7 % — ABNORMAL LOW (ref 39.0–52.0)
Hemoglobin: 8 g/dL — ABNORMAL LOW (ref 13.0–17.0)
MCH: 32.3 pg (ref 26.0–34.0)
MCHC: 31.1 g/dL (ref 30.0–36.0)
MCV: 103.6 fL — ABNORMAL HIGH (ref 80.0–100.0)
Platelets: 226 10*3/uL (ref 150–400)
RBC: 2.48 MIL/uL — ABNORMAL LOW (ref 4.22–5.81)
RDW: 17.1 % — ABNORMAL HIGH (ref 11.5–15.5)
WBC: 6.5 10*3/uL (ref 4.0–10.5)
nRBC: 0 % (ref 0.0–0.2)

## 2021-06-06 LAB — ECHOCARDIOGRAM COMPLETE
AR max vel: 2.03 cm2
AV Area VTI: 2.09 cm2
AV Area mean vel: 1.92 cm2
AV Mean grad: 3 mmHg
AV Peak grad: 4.8 mmHg
Ao pk vel: 1.09 m/s
Area-P 1/2: 3.68 cm2
Height: 73 in
MV VTI: 3.49 cm2
S' Lateral: 3.5 cm
Weight: 5135.84 oz

## 2021-06-06 LAB — RENAL FUNCTION PANEL
Albumin: 2.5 g/dL — ABNORMAL LOW (ref 3.5–5.0)
Anion gap: 7 (ref 5–15)
BUN: 18 mg/dL (ref 8–23)
CO2: 24 mmol/L (ref 22–32)
Calcium: 8.1 mg/dL — ABNORMAL LOW (ref 8.9–10.3)
Chloride: 101 mmol/L (ref 98–111)
Creatinine, Ser: 1.39 mg/dL — ABNORMAL HIGH (ref 0.61–1.24)
GFR, Estimated: 56 mL/min — ABNORMAL LOW (ref >60)
Glucose, Bld: 96 mg/dL (ref 70–99)
Phosphorus: 3.1 mg/dL (ref 2.5–4.6)
Potassium: 3.6 mmol/L (ref 3.5–5.1)
Sodium: 132 mmol/L — ABNORMAL LOW (ref 135–145)

## 2021-06-06 LAB — TROPONIN I (HIGH SENSITIVITY)
Troponin I (High Sensitivity): 13 ng/L (ref <18)
Troponin I (High Sensitivity): 13 ng/L (ref <18)

## 2021-06-06 LAB — CORTISOL-AM, BLOOD: Cortisol - AM: 9.6 ug/dL (ref 6.7–22.6)

## 2021-06-06 LAB — MAGNESIUM: Magnesium: 1.3 mg/dL — ABNORMAL LOW (ref 1.7–2.4)

## 2021-06-06 LAB — D-DIMER, QUANTITATIVE: D-Dimer, Quant: 1.39 ug/mL-FEU — ABNORMAL HIGH (ref 0.00–0.50)

## 2021-06-06 LAB — GLUCOSE, CAPILLARY: Glucose-Capillary: 114 mg/dL — ABNORMAL HIGH (ref 70–99)

## 2021-06-06 MED ORDER — SODIUM CHLORIDE 0.9 % IV BOLUS
1000.0000 mL | Freq: Once | INTRAVENOUS | Status: AC
  Administered 2021-06-06: 1000 mL via INTRAVENOUS

## 2021-06-06 MED ORDER — MAGNESIUM SULFATE 4 GM/100ML IV SOLN
4.0000 g | Freq: Once | INTRAVENOUS | Status: AC
  Administered 2021-06-06: 4 g via INTRAVENOUS
  Filled 2021-06-06: qty 100

## 2021-06-06 MED ORDER — LIDOCAINE VISCOUS HCL 2 % MT SOLN
15.0000 mL | Freq: Once | OROMUCOSAL | Status: AC
Start: 2021-06-06 — End: 2021-06-06
  Administered 2021-06-06: 15 mL via ORAL
  Filled 2021-06-06: qty 15

## 2021-06-06 MED ORDER — DIPHENHYDRAMINE HCL 50 MG/ML IJ SOLN
50.0000 mg | Freq: Once | INTRAMUSCULAR | Status: DC
  Filled 2021-06-06: qty 1

## 2021-06-06 MED ORDER — DIPHENHYDRAMINE HCL 25 MG PO CAPS: 50.0000 mg | ORAL_CAPSULE | Freq: Once | ORAL | Status: DC

## 2021-06-06 MED ORDER — PREDNISONE 20 MG PO TABS
50.0000 mg | ORAL_TABLET | Freq: Four times a day (QID) | ORAL | Status: AC
  Administered 2021-06-06 – 2021-06-07 (×3): 50 mg via ORAL
  Filled 2021-06-06 (×3): qty 1

## 2021-06-06 MED ORDER — DIPHENHYDRAMINE HCL 50 MG/ML IJ SOLN
50.0000 mg | Freq: Once | INTRAMUSCULAR | Status: AC
  Administered 2021-06-07: 50 mg via INTRAVENOUS
  Filled 2021-06-06: qty 1

## 2021-06-06 MED ORDER — POTASSIUM CHLORIDE CRYS ER 20 MEQ PO TBCR
40.0000 meq | EXTENDED_RELEASE_TABLET | ORAL | Status: AC
  Administered 2021-06-06 (×2): 40 meq via ORAL
  Filled 2021-06-06 (×2): qty 2

## 2021-06-06 MED ORDER — SODIUM CHLORIDE 0.9 % IV SOLN: INTRAVENOUS | Status: DC

## 2021-06-06 MED ORDER — PERFLUTREN LIPID MICROSPHERE
1.0000 mL | INTRAVENOUS | Status: AC | PRN
  Administered 2021-06-06: 3 mL via INTRAVENOUS
  Filled 2021-06-06: qty 10

## 2021-06-06 MED ORDER — ALUM & MAG HYDROXIDE-SIMETH 200-200-20 MG/5ML PO SUSP
30.0000 mL | Freq: Once | ORAL | Status: AC
Start: 2021-06-06 — End: 2021-06-06
  Administered 2021-06-06: 30 mL via ORAL
  Filled 2021-06-06: qty 30

## 2021-06-06 NOTE — Progress Notes (Signed)
Rapid response called at 1102 due to pt's low blood pressure and c/o chest pain. B/P 60/42, pt/ moaning, c/o center chest discomfort. PT has received nitroglycerin SL per assigned nurse. Skin warm and dry, color appropriate, cap refill WNL. Pt denies c/o SOB or nausea. Pt placed in reverse trendelenburg for b/p 60/41, HR 102/min. NS bolus started per order. MD Courage at bedside to assess pt.  Pt placed on cardiac monitoring. Second IV site started and second NS bolus started. Pt continues with c/o chest pain, rates 9/10, resp even and non-labored at 24/min, SaO2 95% on room air. Repeat B/P 88/41, HR 102. Repeat EKG obtained, pt taken out of reverse trendelenburg and laying flat. Repeat b/p 85/56, HR 105. Remains A&O, awake, skin warm and dry, color appropriate. Pt has been talkative throughout episode, able to follow all commands, cooperative. Lab at bedside to draw blood, order received to send pt for CT chest, continue fluid bolus x2. Pt states understanding. Pt states chest pain is better at this time, rating 7/10.
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
## 2021-06-06 NOTE — Plan of Care (Signed)

## 2021-06-06 NOTE — Plan of Care (Signed)
°  Problem: Acute Rehab OT Goals (only OT should resolve) Goal: Pt. Will Perform Upper Body Dressing Flowsheets (Taken 06/06/2021 1003) Pt Will Perform Upper Body Dressing:  Independently  sitting Goal: Pt. Will Perform Lower Body Dressing Flowsheets (Taken 06/06/2021 1003) Pt Will Perform Lower Body Dressing:  with mod assist  with adaptive equipment  sitting/lateral leans Goal: Pt. Will Transfer To Toilet Flowsheets (Taken 06/06/2021 1003) Pt Will Transfer to Toilet:  with +2 assist  stand pivot transfer Goal: Pt/Caregiver Will Perform Home Exercise Program Flowsheets (Taken 06/06/2021 1003) Pt/caregiver will Perform Home Exercise Program:  Increased strength  Both right and left upper extremity  Independently  Dione Petron OT, MOT

## 2021-06-06 NOTE — Progress Notes (Signed)
*  PRELIMINARY RESULTS* Echocardiogram 2D Echocardiogram has been performed.  Elpidio Anis 06/06/2021, 2:21 PM

## 2021-06-07 LAB — COMPREHENSIVE METABOLIC PANEL
ALT: 50 U/L — ABNORMAL HIGH (ref 0–44)
AST: 104 U/L — ABNORMAL HIGH (ref 15–41)
Albumin: 2.7 g/dL — ABNORMAL LOW (ref 3.5–5.0)
Alkaline Phosphatase: 1079 U/L — ABNORMAL HIGH (ref 38–126)
Anion gap: 9 (ref 5–15)
BUN: 18 mg/dL (ref 8–23)
CO2: 16 mmol/L — ABNORMAL LOW (ref 22–32)
Calcium: 8.1 mg/dL — ABNORMAL LOW (ref 8.9–10.3)
Chloride: 107 mmol/L (ref 98–111)
Creatinine, Ser: 1.46 mg/dL — ABNORMAL HIGH (ref 0.61–1.24)
GFR, Estimated: 52 mL/min — ABNORMAL LOW (ref >60)
Glucose, Bld: 149 mg/dL — ABNORMAL HIGH (ref 70–99)
Potassium: 5.7 mmol/L — ABNORMAL HIGH (ref 3.5–5.1)
Sodium: 132 mmol/L — ABNORMAL LOW (ref 135–145)
Total Bilirubin: 0.7 mg/dL (ref 0.3–1.2)
Total Protein: 6.1 g/dL — ABNORMAL LOW (ref 6.5–8.1)

## 2021-06-07 LAB — VITAMIN B1: Vitamin B1 (Thiamine): 30.3 nmol/L — ABNORMAL LOW (ref 66.5–200.0)

## 2021-06-07 MED ORDER — FOSFOMYCIN TROMETHAMINE 3 G PO PACK
3.0000 g | PACK | Freq: Once | ORAL | Status: AC
  Administered 2021-06-07: 3 g via ORAL
  Filled 2021-06-07: qty 3

## 2021-06-07 MED ORDER — SODIUM ZIRCONIUM CYCLOSILICATE 10 G PO PACK
10.0000 g | PACK | Freq: Once | ORAL | Status: AC
  Administered 2021-06-07: 10 g via ORAL
  Filled 2021-06-07: qty 1

## 2021-06-07 MED ORDER — IOHEXOL 350 MG/ML SOLN
100.0000 mL | Freq: Once | INTRAVENOUS | Status: AC | PRN
  Administered 2021-06-07: 100 mL via INTRAVENOUS

## 2021-06-07 MED ORDER — MIDODRINE HCL 10 MG PO TABS: 10.0000 mg | ORAL_TABLET | Freq: Three times a day (TID) | ORAL | 2 refills | Status: AC

## 2021-06-07 NOTE — Progress Notes (Signed)
Pt had incontinent BM, cleaned and linens changed. Pt able to roll self to assist. Tolerated well.

## 2021-06-07 NOTE — Discharge Summary (Signed)
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 authorized for detection and/or diagnosis of SARS-CoV-2 by FDA under an Emergency Use Authorization (EUA). This EUA will remain  in effect (meaning this test can be used) for the duration of the COVID-19 declaration under Section 564(b)(1) of the Act, 21 U.S.C.section 360bbb-3(b)(1), unless the authorization is terminated  or revoked sooner.       Influenza A by PCR NEGATIVE NEGATIVE Final   Influenza B by PCR NEGATIVE NEGATIVE Final    Comment: (NOTE) The Xpert Xpress SARS-CoV-2/FLU/RSV plus assay is intended as an aid in the diagnosis of influenza from Nasopharyngeal swab specimens and should not be used as a sole basis for treatment. Nasal washings and aspirates are unacceptable for Xpert Xpress SARS-CoV-2/FLU/RSV testing.  Fact Sheet for  Patients: EntrepreneurPulse.com.au  Fact Sheet for Healthcare Providers: IncredibleEmployment.be  This test is not yet approved or cleared by the [place] FDA and has been authorized for detection and/or diagnosis of SARS-CoV-2 by FDA under an Emergency Use Authorization (EUA). This EUA will remain in effect (meaning this test can be used) for the duration of the COVID-19 declaration under Section 564(b)(1) of the Act, 21 U.S.C. section 360bbb-3(b)(1), unless the authorization is terminated or revoked.  Performed at [facility], [address], [place], [place] [address]   Urine Culture     Status: Abnormal   Collection Time: 06/03/21  5:55 AM   Specimen: Urine, Clean Catch  Result Value Ref Range Status   Specimen Description   Final    URINE, CLEAN CATCH Performed at [facility], [address] [place], [place] [address]    Special Requests   Final    NONE Performed at [facility], [address] [place],  [address]    Culture >=100,000 COLONIES/mL ENTEROCOCCUS FAECALIS (A)  Final  Report Status 06/05/2021 FINAL  Final   Organism ID, Bacteria ENTEROCOCCUS FAECALIS (A)  Final      Susceptibility   Enterococcus faecalis - MIC*    AMPICILLIN <=2 SENSITIVE Sensitive     NITROFURANTOIN <=16 SENSITIVE Sensitive     VANCOMYCIN 1 SENSITIVE Sensitive     * >=100,000 COLONIES/mL ENTEROCOCCUS FAECALIS    Today   Subjective    Almyra Free today has no new concerns, No fever  Or chills   No Nausea, Vomiting or Diarrhea Eating and drinking well,  -BP improved  Patient has been seen and examined prior to discharge   Objective   Blood pressure 108/64, pulse 78, temperature 97.6 F (36.4 C), temperature source Oral, resp. rate 18, height 6\' 1"  (1.854 m), weight (!) 155.2 kg, SpO2 96 %.   Intake/Output Summary (Last 24 hours) at 06/07/2021 1140 Last data filed at 06/06/2021 2216 Gross per 24 hour  Intake 1087.9 ml  Output  700 ml  Net 387.9 ml    Exam Gen:- Awake Alert,  , morbidly obese in no acute distress HEENT:- [place].AT, No sclera icterus Neck-Supple Neck,No JVD,.  Lungs-  CTAB , fair symmetrical air movement CV- S1, S2 normal, regular  Abd-  +ve B.Sounds, Abd Soft, No tenderness,    Extremity/Skin:-Excoriated areas of skin, scabs and scratches, pedal pulses present  Psych-affect is appropriate, oriented x3, forgetful at times Neuro-generalized weakness, no new focal deficits, no tremors   Data Review   CBC w Diff:  Lab Results  Component Value Date   WBC 6.5 06/06/2021   HGB 8.0 (L) 06/06/2021   HCT 25.7 (L) 06/06/2021   PLT 226 06/06/2021   LYMPHOPCT 13 06/03/2021   MONOPCT 5 06/03/2021   EOSPCT 1 06/03/2021   BASOPCT 0 06/03/2021    CMP:  Lab Results  Component Value Date   NA 132 (L) 06/07/2021   K 5.7 (H) 06/07/2021   CL 107 06/07/2021   CO2 16 (L) 06/07/2021   BUN 18 06/07/2021   CREATININE 1.46 (H) 06/07/2021   PROT 6.1 (L) 06/07/2021   ALBUMIN 2.7 (L) 06/07/2021   BILITOT 0.7 06/07/2021   ALKPHOS 1,079 (H) 06/07/2021   AST 104 (H) 06/07/2021   ALT 50 (H) 06/07/2021  .  Total Discharge time is about 33 minutes  Roxan Hockey M.D on 06/07/2021 at 11:40 AM  Go to [email or web address] -  for contact info  [facility] - Office  [phone]

## 2021-06-07 NOTE — TOC Transition Note (Signed)
Transition of Care [facility]) - CM/SW Discharge Note   Patient Details  Name: Edwin Snyder MRN: [id number] Date of Birth: [birth year]  Transition of Care [facility]) CM/SW Contact:  Boneta Lucks, RN Phone Number: 06/07/2021, 12:32 PM   Clinical Narrative:   Patient was agreeable to [facility] bed offer. Insurance authorization received today, he is medically ready for discharge. [place] summary, patient will go to B1-Bed1. RN to call report.  TOC printed med necessity and will call EMS when RN is ready.    Final next level of care: Skilled Nursing Facility Barriers to Discharge: Barriers Resolved  Patient Goals and CMS Choice Patient states their goals for this hospitalization and ongoing recovery are:: agreeable to SNF CMS Medicare.gov Compare Post Acute Care list provided to:: Patient Choice offered to / list presented to : Patient  Discharge Placement              Patient to be transferred to facility by: EMS Name of family member notified: Patient Patient and family notified of of transfer: 06/07/21  Discharge Plan and Services      HH Arranged: RN, PT, OT, Social Work, Nurse's Aide [place] Agency: [facility] Date [place]: 06/04/21 Time [place]: 10 Representative spoke with at [place]: [place]  Readmission Risk Interventions Readmission Risk Prevention Plan 06/07/2021  Transportation Screening Complete  PCP or Specialist Appt within 5-7 Days Complete  Home Care Screening Complete  Medication Review (RN CM) Complete  Some recent data might be hidden
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
## 2021-06-07 NOTE — Discharge Instructions (Signed)
Recommending strict follow-up with weight loss clinic at [facility] ASAP Follow with the PCP closely--current medication specially midodrine needs to be titrated Aggressive weight loss, PT, diet and exercise recommended -Please follow-up with Neurologist Dr. Phillips Odor--  for pain management as outpatient--- ---Phone: [phone], Address: [address], [place], [place] [address] in 4 to 6 weeks for recheck and reevaluation.  Please call to make appointment with him
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
## 2021-06-08 NOTE — Progress Notes (Signed)
Patient picked up by EMS for transport after discharge. IV removed, catheter intact. Patient belongings sent with patient and EMS. Discharge packet was given by previous shift.

## 2021-06-12 ENCOUNTER — Emergency Department (HOSPITAL_COMMUNITY): Payer: Medicare Other

## 2021-06-12 ENCOUNTER — Encounter (HOSPITAL_COMMUNITY): Payer: Self-pay

## 2021-06-12 ENCOUNTER — Inpatient Hospital Stay (HOSPITAL_COMMUNITY)
Admission: EM | Admit: 2021-06-12 | Discharge: 2021-06-20 | DRG: 640 | Disposition: A | Discharge status: Skilled Nursing Facility | Payer: Medicare Other | Source: Skilled Nursing Facility | Attending: Internal Medicine | Admitting: Internal Medicine

## 2021-06-12 ENCOUNTER — Other Ambulatory Visit: Payer: Self-pay

## 2021-06-12 DIAGNOSIS — R410 Disorientation, unspecified: Secondary | ICD-10-CM | POA: Diagnosis present

## 2021-06-12 DIAGNOSIS — E785 Hyperlipidemia, unspecified: Secondary | ICD-10-CM | POA: Diagnosis present

## 2021-06-12 DIAGNOSIS — R531 Weakness: Secondary | ICD-10-CM

## 2021-06-12 DIAGNOSIS — R7401 Elevation of levels of liver transaminase levels: Secondary | ICD-10-CM | POA: Diagnosis present

## 2021-06-12 DIAGNOSIS — E7220 Disorder of urea cycle metabolism, unspecified: Secondary | ICD-10-CM

## 2021-06-12 DIAGNOSIS — E512 Wernicke's encephalopathy: Secondary | ICD-10-CM | POA: Diagnosis not present

## 2021-06-12 DIAGNOSIS — M1990 Unspecified osteoarthritis, unspecified site: Secondary | ICD-10-CM | POA: Diagnosis present

## 2021-06-12 DIAGNOSIS — I129 Hypertensive chronic kidney disease with stage 1 through stage 4 chronic kidney disease, or unspecified chronic kidney disease: Secondary | ICD-10-CM | POA: Diagnosis present

## 2021-06-12 DIAGNOSIS — I10 Essential (primary) hypertension: Secondary | ICD-10-CM | POA: Diagnosis present

## 2021-06-12 DIAGNOSIS — K449 Diaphragmatic hernia without obstruction or gangrene: Secondary | ICD-10-CM | POA: Diagnosis present

## 2021-06-12 DIAGNOSIS — R601 Generalized edema: Secondary | ICD-10-CM | POA: Diagnosis present

## 2021-06-12 DIAGNOSIS — T43595A Adverse effect of other antipsychotics and neuroleptics, initial encounter: Secondary | ICD-10-CM | POA: Diagnosis not present

## 2021-06-12 DIAGNOSIS — F05 Delirium due to known physiological condition: Secondary | ICD-10-CM | POA: Diagnosis present

## 2021-06-12 DIAGNOSIS — R109 Unspecified abdominal pain: Secondary | ICD-10-CM | POA: Diagnosis present

## 2021-06-12 DIAGNOSIS — M25519 Pain in unspecified shoulder: Secondary | ICD-10-CM | POA: Diagnosis present

## 2021-06-12 DIAGNOSIS — R419 Unspecified symptoms and signs involving cognitive functions and awareness: Secondary | ICD-10-CM | POA: Diagnosis present

## 2021-06-12 DIAGNOSIS — I959 Hypotension, unspecified: Secondary | ICD-10-CM | POA: Diagnosis present

## 2021-06-12 DIAGNOSIS — G894 Chronic pain syndrome: Secondary | ICD-10-CM | POA: Diagnosis present

## 2021-06-12 DIAGNOSIS — R946 Abnormal results of thyroid function studies: Secondary | ICD-10-CM | POA: Diagnosis present

## 2021-06-12 DIAGNOSIS — Z91041 Radiographic dye allergy status: Secondary | ICD-10-CM

## 2021-06-12 DIAGNOSIS — G9341 Metabolic encephalopathy: Secondary | ICD-10-CM | POA: Diagnosis not present

## 2021-06-12 DIAGNOSIS — Z6841 Body Mass Index (BMI) 40.0 and over, adult: Secondary | ICD-10-CM

## 2021-06-12 DIAGNOSIS — N390 Urinary tract infection, site not specified: Secondary | ICD-10-CM | POA: Diagnosis present

## 2021-06-12 DIAGNOSIS — Z87440 Personal history of urinary (tract) infections: Secondary | ICD-10-CM

## 2021-06-12 DIAGNOSIS — R7989 Other specified abnormal findings of blood chemistry: Secondary | ICD-10-CM | POA: Diagnosis present

## 2021-06-12 DIAGNOSIS — K219 Gastro-esophageal reflux disease without esophagitis: Secondary | ICD-10-CM | POA: Diagnosis present

## 2021-06-12 DIAGNOSIS — K8309 Other cholangitis: Secondary | ICD-10-CM

## 2021-06-12 DIAGNOSIS — D539 Nutritional anemia, unspecified: Secondary | ICD-10-CM | POA: Diagnosis present

## 2021-06-12 DIAGNOSIS — U071 COVID-19: Secondary | ICD-10-CM | POA: Diagnosis present

## 2021-06-12 DIAGNOSIS — Z0189 Encounter for other specified special examinations: Secondary | ICD-10-CM

## 2021-06-12 DIAGNOSIS — Z79899 Other long term (current) drug therapy: Secondary | ICD-10-CM

## 2021-06-12 DIAGNOSIS — E860 Dehydration: Secondary | ICD-10-CM | POA: Diagnosis present

## 2021-06-12 DIAGNOSIS — Z881 Allergy status to other antibiotic agents status: Secondary | ICD-10-CM

## 2021-06-12 DIAGNOSIS — I2510 Atherosclerotic heart disease of native coronary artery without angina pectoris: Secondary | ICD-10-CM | POA: Diagnosis present

## 2021-06-12 DIAGNOSIS — F1020 Alcohol dependence, uncomplicated: Secondary | ICD-10-CM | POA: Diagnosis present

## 2021-06-12 DIAGNOSIS — R443 Hallucinations, unspecified: Secondary | ICD-10-CM

## 2021-06-12 DIAGNOSIS — N1831 Chronic kidney disease, stage 3a: Secondary | ICD-10-CM | POA: Diagnosis present

## 2021-06-12 DIAGNOSIS — R0902 Hypoxemia: Secondary | ICD-10-CM

## 2021-06-12 DIAGNOSIS — E8809 Other disorders of plasma-protein metabolism, not elsewhere classified: Secondary | ICD-10-CM | POA: Diagnosis present

## 2021-06-12 DIAGNOSIS — Z880 Allergy status to penicillin: Secondary | ICD-10-CM

## 2021-06-12 DIAGNOSIS — R079 Chest pain, unspecified: Secondary | ICD-10-CM | POA: Diagnosis present

## 2021-06-12 LAB — LACTIC ACID, PLASMA: Lactic Acid, Venous: 1.2 mmol/L (ref 0.5–1.9)

## 2021-06-12 LAB — COMPREHENSIVE METABOLIC PANEL
ALT: 71 U/L — ABNORMAL HIGH (ref 0–44)
AST: 86 U/L — ABNORMAL HIGH (ref 15–41)
Albumin: 2.9 g/dL — ABNORMAL LOW (ref 3.5–5.0)
Alkaline Phosphatase: 855 U/L — ABNORMAL HIGH (ref 38–126)
Anion gap: 12 (ref 5–15)
BUN: 15 mg/dL (ref 8–23)
CO2: 23 mmol/L (ref 22–32)
Calcium: 8.4 mg/dL — ABNORMAL LOW (ref 8.9–10.3)
Chloride: 100 mmol/L (ref 98–111)
Creatinine, Ser: 1.42 mg/dL — ABNORMAL HIGH (ref 0.61–1.24)
GFR, Estimated: 54 mL/min — ABNORMAL LOW (ref >60)
Glucose, Bld: 110 mg/dL — ABNORMAL HIGH (ref 70–99)
Potassium: 4.3 mmol/L (ref 3.5–5.1)
Sodium: 135 mmol/L (ref 135–145)
Total Bilirubin: 0.5 mg/dL (ref 0.3–1.2)
Total Protein: 6.2 g/dL — ABNORMAL LOW (ref 6.5–8.1)

## 2021-06-12 LAB — RESP PANEL BY RT-PCR (FLU A&B, COVID) ARPGX2
Influenza A by PCR: NEGATIVE
Influenza B by PCR: NEGATIVE
SARS Coronavirus 2 by RT PCR: POSITIVE — AB

## 2021-06-12 LAB — URINALYSIS, ROUTINE W REFLEX MICROSCOPIC
Bilirubin Urine: NEGATIVE
Glucose, UA: NEGATIVE mg/dL
Ketones, ur: NEGATIVE mg/dL
Leukocytes,Ua: NEGATIVE
Nitrite: NEGATIVE
Protein, ur: NEGATIVE mg/dL
Specific Gravity, Urine: 1.011 (ref 1.005–1.030)
pH: 5 (ref 5.0–8.0)

## 2021-06-12 LAB — CBC WITH DIFFERENTIAL/PLATELET
Abs Immature Granulocytes: 0.06 10*3/uL (ref 0.00–0.07)
Basophils Absolute: 0 10*3/uL (ref 0.0–0.1)
Basophils Relative: 0 %
Eosinophils Absolute: 0.1 10*3/uL (ref 0.0–0.5)
Eosinophils Relative: 1 %
HCT: 28.5 % — ABNORMAL LOW (ref 39.0–52.0)
Hemoglobin: 8.9 g/dL — ABNORMAL LOW (ref 13.0–17.0)
Immature Granulocytes: 2 %
Lymphocytes Relative: 20 %
Lymphs Abs: 0.7 10*3/uL (ref 0.7–4.0)
MCH: 32.4 pg (ref 26.0–34.0)
MCHC: 31.2 g/dL (ref 30.0–36.0)
MCV: 103.6 fL — ABNORMAL HIGH (ref 80.0–100.0)
Monocytes Absolute: 0.4 10*3/uL (ref 0.1–1.0)
Monocytes Relative: 11 %
Neutro Abs: 2.3 10*3/uL (ref 1.7–7.7)
Neutrophils Relative %: 66 %
Platelets: 159 10*3/uL (ref 150–400)
RBC: 2.75 MIL/uL — ABNORMAL LOW (ref 4.22–5.81)
RDW: 17.5 % — ABNORMAL HIGH (ref 11.5–15.5)
WBC: 3.5 10*3/uL — ABNORMAL LOW (ref 4.0–10.5)
nRBC: 0 % (ref 0.0–0.2)

## 2021-06-12 LAB — VITAMIN B12: Vitamin B-12: 566 pg/mL (ref 180–914)

## 2021-06-12 LAB — CK: Total CK: 15 U/L — ABNORMAL LOW (ref 49–397)

## 2021-06-12 LAB — TSH: TSH: 7.664 u[IU]/mL — ABNORMAL HIGH (ref 0.350–4.500)

## 2021-06-12 LAB — MAGNESIUM: Magnesium: 1.6 mg/dL — ABNORMAL LOW (ref 1.7–2.4)

## 2021-06-12 LAB — ACETAMINOPHEN LEVEL: Acetaminophen (Tylenol), Serum: <10 ug/mL — ABNORMAL LOW (ref 10–30)

## 2021-06-12 LAB — AMMONIA: Ammonia: 21 umol/L (ref 9–35)

## 2021-06-12 LAB — CBG MONITORING, ED: Glucose-Capillary: 83 mg/dL (ref 70–99)

## 2021-06-12 MED ORDER — SODIUM CHLORIDE 0.9 % IV SOLN: INTRAVENOUS | Status: DC

## 2021-06-12 MED ORDER — IPRATROPIUM-ALBUTEROL 0.5-2.5 (3) MG/3ML IN SOLN
3.0000 mL | Freq: Four times a day (QID) | RESPIRATORY_TRACT | Status: DC
  Administered 2021-06-13: 3 mL via RESPIRATORY_TRACT
  Filled 2021-06-12 (×2): qty 3

## 2021-06-12 MED ORDER — IPRATROPIUM-ALBUTEROL 0.5-2.5 (3) MG/3ML IN SOLN
3.0000 mL | RESPIRATORY_TRACT | Status: DC | PRN
  Administered 2021-06-16: 3 mL via RESPIRATORY_TRACT
  Filled 2021-06-12: qty 3

## 2021-06-12 MED ORDER — GUAIFENESIN 100 MG/5ML PO LIQD: 5.0000 mL | ORAL | Status: DC | PRN

## 2021-06-12 MED ORDER — CIPROFLOXACIN HCL 250 MG PO TABS: 500.0000 mg | ORAL_TABLET | Freq: Two times a day (BID) | ORAL | Status: DC

## 2021-06-12 MED ORDER — SODIUM CHLORIDE 0.9 % IV SOLN: INTRAVENOUS | Status: AC

## 2021-06-12 MED ORDER — MIDODRINE HCL 5 MG PO TABS
10.0000 mg | ORAL_TABLET | Freq: Three times a day (TID) | ORAL | Status: DC
  Administered 2021-06-13 – 2021-06-20 (×21): 10 mg via ORAL
  Filled 2021-06-12 (×22): qty 2

## 2021-06-12 MED ORDER — INSULIN ASPART 100 UNIT/ML IJ SOLN: 0.0000 [IU] | Freq: Every day | INTRAMUSCULAR | Status: DC

## 2021-06-12 MED ORDER — INSULIN ASPART 100 UNIT/ML IJ SOLN
0.0000 [IU] | Freq: Three times a day (TID) | INTRAMUSCULAR | Status: DC
  Administered 2021-06-20: 1 [IU] via SUBCUTANEOUS

## 2021-06-12 MED ORDER — ENOXAPARIN SODIUM 40 MG/0.4ML IJ SOSY: 40.0000 mg | PREFILLED_SYRINGE | INTRAMUSCULAR | Status: DC

## 2021-06-12 MED ORDER — SENNOSIDES-DOCUSATE SODIUM 8.6-50 MG PO TABS
1.0000 | ORAL_TABLET | Freq: Two times a day (BID) | ORAL | Status: DC
  Administered 2021-06-12 – 2021-06-20 (×11): 1 via ORAL
  Filled 2021-06-12 (×13): qty 1

## 2021-06-12 MED ORDER — PANTOPRAZOLE SODIUM 40 MG PO TBEC
40.0000 mg | DELAYED_RELEASE_TABLET | Freq: Every day | ORAL | Status: DC
  Administered 2021-06-12 – 2021-06-20 (×8): 40 mg via ORAL
  Filled 2021-06-12 (×9): qty 1

## 2021-06-12 MED ORDER — ACETAMINOPHEN 325 MG PO TABS
650.0000 mg | ORAL_TABLET | Freq: Four times a day (QID) | ORAL | Status: DC | PRN
  Administered 2021-06-17 – 2021-06-20 (×6): 650 mg via ORAL
  Filled 2021-06-12 (×6): qty 2

## 2021-06-12 MED ORDER — ENOXAPARIN SODIUM 80 MG/0.8ML IJ SOSY
0.5000 mg/kg | PREFILLED_SYRINGE | INTRAMUSCULAR | Status: DC
  Filled 2021-06-12: qty 0.8

## 2021-06-12 MED ORDER — METOPROLOL TARTRATE 5 MG/5ML IV SOLN: 5.0000 mg | INTRAVENOUS | Status: DC | PRN

## 2021-06-12 MED ORDER — HYDRALAZINE HCL 20 MG/ML IJ SOLN: 10.0000 mg | INTRAMUSCULAR | Status: DC | PRN

## 2021-06-12 MED ORDER — SENNOSIDES-DOCUSATE SODIUM 8.6-50 MG PO TABS: 1.0000 | ORAL_TABLET | Freq: Every evening | ORAL | Status: DC | PRN

## 2021-06-12 NOTE — ED Notes (Signed)
Pt has multiple open sores on bilateral upper extremities. Pt states "I've been picking at them". Dressing placed on bleeding sores.

## 2021-06-12 NOTE — ED Notes (Signed)
Pt soiled with urine upon assessment. New brief placed, and new gown placed.

## 2021-06-12 NOTE — ED Provider Notes (Signed)
[facility] EMERGENCY DEPARTMENT Provider Note   CSN: [id number] Arrival date & time: 06/12/21  1244     History Chief Complaint  Patient presents with   Edwin Snyder is a 66 y.o. male with history of brain tumor, status post, arthritis, coronary artery disease, obesity, hypokalemia, hypomagnesia, hyperlipidemia, hypertension and obesity presents emergency department for evaluation of altered mental status and hallucinations for the past 2 days.  The patient is presenting from [place] after discharge from [facility] on 06-02-2021 for hypokalemia, hypomagnesia, AKI, anemia, and dehydration.  Patient been there for 4 days and is alert and oriented through the beginning of this stay.  Two days ago, the patient had a fall where he did hit his head and has since been disoriented and having hallucinations.  The patient is oriented to person and place, however not time.  He reports "they told me I was hallucinating".  The patient reports there are several men dressed in black try to kidnap his granddaughters and hurt them.  He reports they are not speaking to him while he is at the hospital, but was they were talking to him at "the other place".  The patient reports they are here in the hospital watching him now and hiding.  He denies any pain anywhere.  Denies any chest pain or shortness of breath.  He denies any abdominal pain, nausea, or vomiting.   Fall      Home Medications Prior to Admission medications   Medication Sig Start Date End Date Taking? Authorizing Provider  albuterol (PROVENTIL HFA;VENTOLIN HFA) 108 (90 Base) MCG/ACT inhaler Inhale 2 puffs into the lungs every 4 (four) hours as needed for wheezing or shortness of breath.   Yes [provider]  Buprenorphine HCl-Naloxone HCl 8-2 MG FILM Place 1 Film under the tongue in the morning, at noon, and at bedtime. For pain   Yes [provider]  ciprofloxacin (CIPRO) 500 MG tablet Take 500 mg by mouth 2 (two)  times daily.   Yes [provider]  guaiFENesin (MUCINEX) 600 MG 12 hr tablet Take 600 mg by mouth 2 (two) times daily.   Yes [provider]  ipratropium-albuterol (DUONEB) 0.5-2.5 (3) MG/3ML SOLN Take 3 mLs by nebulization every 6 (six) hours as needed (SOB).   Yes [provider]  lactobacillus acidophilus & bulgar (LACTINEX) chewable tablet Chew 1 tablet by mouth 3 (three) times daily with meals for 10 days. 06/04/21 06/14/21 Yes Shahmehdi, Seyed A, MD  midodrine (PROAMATINE) 10 MG tablet Take 1 tablet (10 mg total) by mouth 3 (three) times daily with meals. 06/07/21 07/07/21 Yes Emokpae, Courage, MD  nystatin cream (MYCOSTATIN) Apply topically 2 (two) times daily. 06/04/21  Yes Shahmehdi, Seyed A, MD  omeprazole (PRILOSEC) 40 MG capsule Take 40 mg by mouth daily.   Yes [provider]  senna-docusate (SENOKOT-S) 8.6-50 MG tablet Take 1 tablet by mouth 2 (two) times daily. 06/04/21  Yes Shahmehdi, Seyed A, MD  simvastatin (ZOCOR) 40 MG tablet Take 40 mg by mouth daily.   Yes [provider]      Allergies    Clindamycin/lincomycin, Penicillins, and Ivp dye [iodinated contrast media]    Review of Systems   Review of Systems  Unable to perform ROS: Mental status change   Physical Exam Updated Vital Signs BP (!) 125/94    Pulse 89    Temp 99 F (37.2 C) (Oral)    Resp 16    Ht  _0  (1.854 m)    Wt (!) 155.2 kg    SpO2 98%    BMI 45.14 kg/m  Physical Exam Vitals and nursing note reviewed.  Constitutional:      General: He is not in acute distress.    Appearance: He is not toxic-appearing.  HENT:     Head: Normocephalic and atraumatic.     Mouth/Throat:     Mouth: Mucous membranes are moist.  Eyes:     General: No scleral icterus.    Extraocular Movements: Extraocular movements intact.     Pupils: Pupils are equal, round, and reactive to light.  Cardiovascular:     Rate and Rhythm: Normal rate and regular rhythm.  Pulmonary:     Effort:  Pulmonary effort is normal. No respiratory distress.  Abdominal:     General: Bowel sounds are normal.     Palpations: Abdomen is soft.     Tenderness: There is no abdominal tenderness. There is no guarding or rebound.     Comments: Abdominal exam limited secondary to body habitus.  Patient has diffuse bruising noted to the abdomen which is likely due to his Lovenox injections from his recent stay.  He is nontender to palpation.  Normal active bowel sounds.  Musculoskeletal:     Cervical back: No tenderness.  Skin:    Comments: Multiple scabs and some bleeding areas on the arms bilaterally  Neurological:     General: No focal deficit present.     Mental Status: He is alert.     Cranial Nerves: No cranial nerve deficit.     Motor: Weakness present.     Comments: Patient is oriented to person and place, just not time.  Weakness in bilateral legs although this is symmetric.  Strength and upper arms.  Patient is able to follow commands and I do not notice any cranial nerve deficit.  Psychiatric:        Attention and Perception: He perceives visual hallucinations.        Thought Content: Thought content is paranoid.     Comments: Patient is having multiple episodes of hallucinations with both myself and nursing staff.  Utilizing family members at bedside that are not there.  He is also having paranoid behavior thinking that someone is out to hurt his grandchildren.  He is not aggressive towards me and is mostly cooperative with my exam.    ED Results / Procedures / Treatments   Labs (all labs ordered are listed, but only abnormal results are displayed) Labs Reviewed  CBC WITH DIFFERENTIAL/PLATELET - Abnormal; Notable for the following components:      Result Value   WBC 3.5 (*)    RBC 2.75 (*)    Hemoglobin 8.9 (*)    HCT 28.5 (*)    MCV 103.6 (*)    RDW 17.5 (*)    All other components within normal limits  COMPREHENSIVE METABOLIC PANEL - Abnormal; Notable for the following components:    Glucose, Bld 110 (*)    Creatinine, Ser 1.42 (*)    Calcium 8.4 (*)    Total Protein 6.2 (*)    Albumin 2.9 (*)    AST 86 (*)    ALT 71 (*)    Alkaline Phosphatase 855 (*)    GFR, Estimated 54 (*)    All other components within normal limits  MAGNESIUM - Abnormal; Notable for the following components:   Magnesium 1.6 (*)    All other components within normal limits  ACETAMINOPHEN LEVEL - Abnormal; Notable for the following components:   Acetaminophen (Tylenol), Serum <10 (*)    All other components within normal limits  AMMONIA  URINALYSIS, ROUTINE W REFLEX MICROSCOPIC    EKG None  Radiology CT Head Wo Contrast  Result Date: 06/12/2021 CLINICAL DATA:  Head trauma moderate to severe.  Fall 2 days ago EXAM: CT HEAD WITHOUT CONTRAST CT CERVICAL SPINE WITHOUT CONTRAST TECHNIQUE: Multidetector CT imaging of the head and cervical spine was performed following the standard protocol without intravenous contrast. Multiplanar CT image reconstructions of the cervical spine were also generated. RADIATION DOSE REDUCTION: This exam was performed according to the departmental dose-optimization program which includes automated exposure control, adjustment of the mA and/or kV according to patient size and/or use of iterative reconstruction technique. COMPARISON:  CT head 05/07/2021 FINDINGS: CT HEAD FINDINGS Brain: Mild atrophy.  Mild white matter hypodensity bilaterally Negative for acute infarct, hemorrhage, mass Vascular: Negative for hyperdense vessel Skull: Left parietal craniotomy. Craniotomy flap in good position with bony incorporation. No acute skeletal abnormality Sinuses/Orbits: Mucosal edema paranasal sinuses with air-fluid levels in the right maxillary sinus and sphenoid sinus. Mastoid clear bilaterally. Negative orbit Other: None CT CERVICAL SPINE FINDINGS Alignment: Mild retrolisthesis C2-3.  Mild anterolisthesis C3-4 Skull base and vertebrae: Negative for fracture. 5 x 10 mm cyst in the  posterior dens likely degenerative. There is pannus posterior to the dens. Soft tissues and spinal canal: Negative for mass or soft tissue swelling Disc levels: Multilevel disc and facet degeneration. Multilevel foraminal stenosis bilaterally due to spurring. Mild central canal stenosis at C6-7 due to spurring. Upper chest: Lung apices clear bilaterally Other: None IMPRESSION: 1. No acute intracranial abnormality.  Left parietal craniotomy 2. Cervical spondylosis.  Negative for cervical fracture Electronically Signed   By: Franchot Gallo M.D.   On: 06/12/2021 15:16   CT Cervical Spine Wo Contrast  Result Date: 06/12/2021 CLINICAL DATA:  Head trauma moderate to severe.  Fall 2 days ago EXAM: CT HEAD WITHOUT CONTRAST CT CERVICAL SPINE WITHOUT CONTRAST TECHNIQUE: Multidetector CT imaging of the head and cervical spine was performed following the standard protocol without intravenous contrast. Multiplanar CT image reconstructions of the cervical spine were also generated. RADIATION DOSE REDUCTION: This exam was performed according to the departmental dose-optimization program which includes automated exposure control, adjustment of the mA and/or kV according to patient size and/or use of iterative reconstruction technique. COMPARISON:  CT head 05/07/2021 FINDINGS: CT HEAD FINDINGS Brain: Mild atrophy.  Mild white matter hypodensity bilaterally Negative for acute infarct, hemorrhage, mass Vascular: Negative for hyperdense vessel Skull: Left parietal craniotomy. Craniotomy flap in good position with bony incorporation. No acute skeletal abnormality Sinuses/Orbits: Mucosal edema paranasal sinuses with air-fluid levels in the right maxillary sinus and sphenoid sinus. Mastoid clear bilaterally. Negative orbit Other: None CT CERVICAL SPINE FINDINGS Alignment: Mild retrolisthesis C2-3.  Mild anterolisthesis C3-4 Skull base and vertebrae: Negative for fracture. 5 x 10 mm cyst in the posterior dens likely degenerative. There  is pannus posterior to the dens. Soft tissues and spinal canal: Negative for mass or soft tissue swelling Disc levels: Multilevel disc and facet degeneration. Multilevel foraminal stenosis bilaterally due to spurring. Mild central canal stenosis at C6-7 due to spurring. Upper chest: Lung apices clear bilaterally Other: None IMPRESSION: 1. No acute intracranial abnormality.  Left parietal craniotomy 2. Cervical spondylosis.  Negative for cervical fracture Electronically Signed   By: Franchot Gallo M.D.   On: 06/12/2021 15:16   US Abdomen Limited  RUQ (LIVER/GB)  Result Date: 06/12/2021 CLINICAL DATA:  Cholangitis EXAM: ULTRASOUND ABDOMEN LIMITED RIGHT UPPER QUADRANT COMPARISON:  CT abdomen done on 01/26/2014 FINDINGS: Gallbladder: Limited evaluation of gallbladder. There is linear hyperechoic focus along the posterior wall without acoustic shadowing. Technologist did not observe tenderness over the gallbladder. There is no fluid around the gallbladder. Common bile duct: Diameter: 6.8 mm Liver: Limited evaluation. There is slightly inhomogeneous echogenicity in the liver. No discrete focal abnormality is seen. Portal vein is patent on color Doppler imaging with normal direction of blood flow towards the liver. Other: Examination was technically difficult due to the patient's body habitus and inability to cooperate adequately for the study. IMPRESSION: Technically limited study due to patient's body habitus and inability to cooperate adequately. Possible gallbladder stones. No other significant sonographic abnormality is seen in the right upper quadrant. Electronically Signed   By: Elmer Picker M.D.   On: 06/12/2021 16:44    Procedures Procedures   Medications Ordered in ED Medications - No data to display  ED Course/ Medical Decision Making/ A&P                           Medical Decision Making Amount and/or Complexity of Data Reviewed Labs: ordered. Radiology: ordered.  Risk Decision  regarding hospitalization.  66 year old male presents emergency department via EMS from his rehab facility for evaluation of altered mental status over the past 2 days after a fall.  Emergent diagnosis includes was not limited to cholangitis, head bleed, cervical fracture, delirium, dementia, polypharmacy, psychiatric disorder.  Vital signs are stable.  Physical exam shows a patient having visual and auditory hallucinations.  He has multiple excoriation marks on his bilateral arms that he is actively scratching and picking at.  He is able to follow two-step commands and has cranial nerve are grossly intact.  He is morbidly obese.  He has weakness in his bilateral legs.  Equal grip strength.  He is oriented to to person and place just not time.  Patient is talking about how there is mention in dark clothing hiding around the emergency department trying to kidnap his granddaughters and they were talking to him back at the rehab facility.  I independently reviewed the patient's labs and imaging.  CBC shows anemia with a hemoglobin of 8.9 although this is improved from 8.0 at his previous hospital stay.  Leukopenia present.  Normal ammonia.  Negative acetaminophen.  Urinalysis shows large blood with rare bacteria likely due to In-N-Out cath.  Positive for COVID.  CMP shows significantly elevated alk phos at 855 from 1079.  From reading the notes, it is likely due from shock liver due to several bouts of hypotension during his previous admission.  Concern for cholangitis given the altered mental status, will order right upper quadrant ultrasound to rule out.  Ultrasound shows possible gallstones but no other significant sonographic abnormality seen right upper quadrant.  It was limited due to the patient's body habitus and inability to cooperate adequately with the exam.  CT head and neck shows no acute intracranial abnormality. Left parietal craniotomy. Cervical spondylosis. Negative for surgical fracture.    Unsure of the patient's cause of the altered mental status although likely due to delirium from possible admission, polypharmacy, dementia, or COVID.  Given the patient's history of brain tumor with craniotomy, call Dr. Theda Sers from neurology, who recommended MRI with and without contrast and inpatient work-up.  Will admit to Dr. Reesa Chew.   Final  Clinical Impression(s) / ED Diagnoses Final diagnoses:  Cholangitis  Hallucinations    Rx / DC Orders ED Discharge Orders     None         Sherrell Puller, Hershal Coria 06/16/21 2338    Godfrey Pick, MD 06/17/21 [phone]

## 2021-06-12 NOTE — H&P (Signed)
History and Physical    Patient: Edwin Snyder DOB: 1955/07/13 DOA: 06/12/2021 DOS: the patient was seen and examined on 06/12/2021 PCP: Hal Morales, DO  Patient coming from: Home  Chief Complaint:  Chief Complaint  Patient presents with   Fall    HPI: Edwin Snyder is a 66 y.o. 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  Patient was recently discharged on 2/9 after being treated for generalized weakness, septic shock from urinary tract infection complicated by transaminitis.  He was eventually discharged home after being treated with fosfomycin for Enterococcus.  Patient states he was doing okay but this morning he rolled off the bed and had a nontraumatic fall. Per records it indicates that [name] had reported patient has been slightly disorientated hallucinating for the past 2 days. In the ED patient was routineBlood work showed transaminitis but overall improving from previous admission, macrocytic anemia, normal ammonia level, negative UA.  CT of the head and cervical spine was negative.  Right upper quadrant ultrasound overall was unremarkable for any acute pathology.  Chest x-ray showed hiatal hernia.  When I saw the patient he was alert awake oriented X4.  He was not having active hallucinations during my visit.  ED PA had discussed case with on-call neurologist, Dr. Theda Sers and recommended admitting the patient for MRI of the brain.  Review of Systems:  General: Denies fever, chills, night sweats or unintended weight loss. Resp: Denies cough, wheezing, shortness of breath. Cardiac: Denies chest pain, palpitations, orthopnea, paroxysmal nocturnal dyspnea. GI: Denies abdominal pain, nausea, vomiting, diarrhea or constipation GU: Denies dysuria, frequency, hesitancy or  incontinence MS: Denies muscle aches, joint pain or swelling Neuro: Denies headache, neurologic deficits (focal weakness, numbness, tingling), abnormal gait Psych: Denies anxiety, depression, SI/HI/AVH Skin: Denies new rashes or lesions ID: Denies sick contacts, exotic exposures, travel Past Medical History:  Diagnosis Date   Arthritis    Brain tumor ([place])    CAD (coronary artery disease)    Obesity    Past Surgical History:  Procedure Laterality Date   FOOT SURGERY     SKIN LESION EXCISION     Social History:  reports that he has never smoked. He has never used smokeless tobacco. He reports that he does not drink alcohol and does not use drugs.  Allergies  Allergen Reactions   Clindamycin/Lincomycin Anaphylaxis   Penicillins Swelling    DID THE REACTION INVOLVE: Swelling of the face/tongue/throat, SOB, or low BP? yes Sudden or severe rash/hives, skin peeling, or the inside of the mouth or nose? yes Did it require medical treatment? yes When did it last happen? 1990 If all above answers are NO, may proceed with cephalosporin use.    Ivp Dye [Iodinated Contrast Media] Other (See Comments)    Pt. States she was injected with IV dye for an Angio chest in June 2022 and legs began swelling the next day.   Pt. States swelling has not gone down since study 3 months ago.   Pt. Refuses all dye studies.      No family history on file.  Prior to Admission medications   Medication Sig Start Date End Date Taking? Authorizing Provider  albuterol (PROVENTIL HFA;VENTOLIN HFA) 108 (90 Base) MCG/ACT inhaler Inhale 2 puffs into the lungs every 4 (four) hours as needed for wheezing or shortness of breath.  Yes [provider]  Buprenorphine HCl-Naloxone HCl 8-2 MG FILM Place 1 Film under the tongue in the morning, at noon, and at bedtime. For pain   Yes [provider]  ciprofloxacin (CIPRO) 500 MG tablet Take 500 mg by mouth 2 (two) times daily.   Yes [provider]  guaiFENesin (MUCINEX) 600 MG 12 hr tablet Take 600 mg by mouth 2 (two) times daily.   Yes [provider]  ipratropium-albuterol (DUONEB) 0.5-2.5 (3) MG/3ML SOLN Take 3 mLs by nebulization every 6 (six) hours as needed (SOB).   Yes [provider]  lactobacillus acidophilus & bulgar (LACTINEX) chewable tablet Chew 1 tablet by mouth 3 (three) times daily with meals for 10 days. 06/04/21 06/14/21 Yes Shahmehdi, Seyed A, MD  midodrine (PROAMATINE) 10 MG tablet Take 1 tablet (10 mg total) by mouth 3 (three) times daily with meals. 06/07/21 07/07/21 Yes Emokpae, Courage, MD  nystatin cream (MYCOSTATIN) Apply topically 2 (two) times daily. 06/04/21  Yes Shahmehdi, Seyed A, MD  omeprazole (PRILOSEC) 40 MG capsule Take 40 mg by mouth daily.   Yes [provider]  senna-docusate (SENOKOT-S) 8.6-50 MG tablet Take 1 tablet by mouth 2 (two) times daily. 06/04/21  Yes Shahmehdi, Seyed A, MD  simvastatin (ZOCOR) 40 MG tablet Take 40 mg by mouth daily.   Yes [provider]    Physical Exam: Vitals:   06/12/21 1246 06/12/21 1257 06/12/21 1535 06/12/21 1805  BP:  116/85 (!) 125/94 (!) 111/96  Pulse:  (!) 102 89 98  Resp:  18 16 18   Temp:  99 F (37.2 C)    TempSrc:  Oral    SpO2:  95% 98% 95%  Weight: (!) 155.2 kg     Height: 6\' 1"  (1.854 m)      Constitutional: Not in acute distress Respiratory: Clear to auscultation bilaterally Cardiovascular: Normal sinus rhythm, no rubs Abdomen: Nontender nondistended good bowel sounds Musculoskeletal: No edema noted Skin: No rashes seen Neurologic: CN 2-12 grossly intact.  And nonfocal Psychiatric: Normal judgment and insight. Alert and oriented x 3. Normal mood.  Data Reviewed:  Assessment and Plan:  Acute metabolic encephalopathy - During my visit he appears to be alert awake oriented.  Neurology recommends observation admission for MRI in the morning.  Ammonia levels are within normal limit, recent TSH was normal as well.   We will continue neurochecks.  Off-and-on having hallucination likely from underlying psychiatry issue.    Transaminitis - Trending down from recent visit.  At that time was attributed to shock liver.  Right upper quadrant ultrasound is unremarkable for acute pathology besides possible gallstones.  Continue to monitor  Recent urinary tract infection - Had grown Enterococcus which was treated with fosfomycin  Nontraumatic fall - CT of the head and cervical spine is negative.  PT/OT.  Morbid obesity - Follow-up outpatient PCP  Large hiatal hernia - Seen on CT scan.  Has a history of GERD already on PPI.  History of essential hypertension - Recently stopped due to hypotension and started on midodrine.  We will continue to monitor him here.   Advance Care Planning:   Code Status: Full Code   Consults: Neuro, Dr Theda Sers consulted by ED  Family Communication: None  Severity of Illness: The appropriate patient status for this patient is OBSERVATION. Observation status is judged to be reasonable and necessary in order to provide the required intensity of service to ensure the patient's safety. The patient's presenting symptoms, physical exam findings,  and initial radiographic and laboratory data in the context of their medical condition is felt to place them at decreased risk for further clinical deterioration. Furthermore, it is anticipated that the patient will be medically stable for discharge from the hospital within 2 midnights of admission.   Author: Damita Lack, MD 06/12/2021 7:51 PM  For on call review [email or web address].

## 2021-06-12 NOTE — ED Triage Notes (Signed)
Pt sent to ED via [facility] from [facility] for psych evaluation. Per nurse, pt was admitted to facility 4 days ago and was alert and oriented, fell 2 days ago and has been hallucinating and disoriented since. Pt did hit his head when he fell per staff.

## 2021-06-12 NOTE — Progress Notes (Signed)
Pt refused treatment states "I don't want no damn breathing treatment, I want something to eat and drink" RT ask pt if he takes treatment at home he states "yes and wear CPAP at night". I ask patient if he wants [place] to bring him a CPAP tonight pt "I don't need it, I need something to eat and drink. I'm about to starve." Nurse informed

## 2021-06-12 NOTE — ED Notes (Signed)
Pt is having a conversation in the hallway. Pt states he is talking to his grandson. No family at bedside.

## 2021-06-12 NOTE — ED Notes (Signed)
Pt stating "that's my niece out there." No family noted at bedside.

## 2021-06-12 NOTE — ED Notes (Signed)
Pt stated "my daughter angel walked off once those cops came in." Pt having visual hallucinations.

## 2021-06-12 NOTE — ED Notes (Signed)
US at bedside

## 2021-06-13 ENCOUNTER — Observation Stay (HOSPITAL_COMMUNITY): Payer: Medicare Other

## 2021-06-13 DIAGNOSIS — R419 Unspecified symptoms and signs involving cognitive functions and awareness: Secondary | ICD-10-CM | POA: Diagnosis present

## 2021-06-13 DIAGNOSIS — I129 Hypertensive chronic kidney disease with stage 1 through stage 4 chronic kidney disease, or unspecified chronic kidney disease: Secondary | ICD-10-CM | POA: Diagnosis present

## 2021-06-13 DIAGNOSIS — R7989 Other specified abnormal findings of blood chemistry: Secondary | ICD-10-CM | POA: Diagnosis not present

## 2021-06-13 DIAGNOSIS — R410 Disorientation, unspecified: Secondary | ICD-10-CM

## 2021-06-13 DIAGNOSIS — M25519 Pain in unspecified shoulder: Secondary | ICD-10-CM | POA: Diagnosis present

## 2021-06-13 DIAGNOSIS — U071 COVID-19: Secondary | ICD-10-CM | POA: Diagnosis present

## 2021-06-13 DIAGNOSIS — N1831 Chronic kidney disease, stage 3a: Secondary | ICD-10-CM | POA: Diagnosis present

## 2021-06-13 DIAGNOSIS — E7220 Disorder of urea cycle metabolism, unspecified: Secondary | ICD-10-CM | POA: Diagnosis not present

## 2021-06-13 DIAGNOSIS — E785 Hyperlipidemia, unspecified: Secondary | ICD-10-CM | POA: Diagnosis present

## 2021-06-13 DIAGNOSIS — E8809 Other disorders of plasma-protein metabolism, not elsewhere classified: Secondary | ICD-10-CM | POA: Diagnosis present

## 2021-06-13 DIAGNOSIS — F1020 Alcohol dependence, uncomplicated: Secondary | ICD-10-CM | POA: Diagnosis present

## 2021-06-13 DIAGNOSIS — G894 Chronic pain syndrome: Secondary | ICD-10-CM

## 2021-06-13 DIAGNOSIS — G9341 Metabolic encephalopathy: Secondary | ICD-10-CM | POA: Diagnosis present

## 2021-06-13 DIAGNOSIS — Z6841 Body Mass Index (BMI) 40.0 and over, adult: Secondary | ICD-10-CM | POA: Diagnosis not present

## 2021-06-13 DIAGNOSIS — K219 Gastro-esophageal reflux disease without esophagitis: Secondary | ICD-10-CM | POA: Diagnosis present

## 2021-06-13 DIAGNOSIS — R109 Unspecified abdominal pain: Secondary | ICD-10-CM | POA: Diagnosis present

## 2021-06-13 DIAGNOSIS — R601 Generalized edema: Secondary | ICD-10-CM | POA: Diagnosis present

## 2021-06-13 DIAGNOSIS — E512 Wernicke's encephalopathy: Secondary | ICD-10-CM | POA: Diagnosis present

## 2021-06-13 DIAGNOSIS — R7401 Elevation of levels of liver transaminase levels: Secondary | ICD-10-CM | POA: Diagnosis present

## 2021-06-13 DIAGNOSIS — Z87440 Personal history of urinary (tract) infections: Secondary | ICD-10-CM | POA: Diagnosis not present

## 2021-06-13 DIAGNOSIS — I2510 Atherosclerotic heart disease of native coronary artery without angina pectoris: Secondary | ICD-10-CM | POA: Diagnosis present

## 2021-06-13 DIAGNOSIS — F05 Delirium due to known physiological condition: Secondary | ICD-10-CM | POA: Diagnosis present

## 2021-06-13 DIAGNOSIS — R946 Abnormal results of thyroid function studies: Secondary | ICD-10-CM | POA: Diagnosis present

## 2021-06-13 DIAGNOSIS — D539 Nutritional anemia, unspecified: Secondary | ICD-10-CM | POA: Diagnosis present

## 2021-06-13 DIAGNOSIS — I959 Hypotension, unspecified: Secondary | ICD-10-CM | POA: Diagnosis present

## 2021-06-13 LAB — CBC
HCT: 27.5 % — ABNORMAL LOW (ref 39.0–52.0)
Hemoglobin: 8.2 g/dL — ABNORMAL LOW (ref 13.0–17.0)
MCH: 31.2 pg (ref 26.0–34.0)
MCHC: 29.8 g/dL — ABNORMAL LOW (ref 30.0–36.0)
MCV: 104.6 fL — ABNORMAL HIGH (ref 80.0–100.0)
Platelets: 144 10*3/uL — ABNORMAL LOW (ref 150–400)
RBC: 2.63 MIL/uL — ABNORMAL LOW (ref 4.22–5.81)
RDW: 17.2 % — ABNORMAL HIGH (ref 11.5–15.5)
WBC: 2.7 10*3/uL — ABNORMAL LOW (ref 4.0–10.5)
nRBC: 0 % (ref 0.0–0.2)

## 2021-06-13 LAB — GLUCOSE, CAPILLARY
Glucose-Capillary: 83 mg/dL (ref 70–99)
Glucose-Capillary: 105 mg/dL — ABNORMAL HIGH (ref 70–99)

## 2021-06-13 LAB — COMPREHENSIVE METABOLIC PANEL
ALT: 52 U/L — ABNORMAL HIGH (ref 0–44)
AST: 51 U/L — ABNORMAL HIGH (ref 15–41)
Albumin: 2.8 g/dL — ABNORMAL LOW (ref 3.5–5.0)
Alkaline Phosphatase: 688 U/L — ABNORMAL HIGH (ref 38–126)
Anion gap: 13 (ref 5–15)
BUN: 15 mg/dL (ref 8–23)
CO2: 23 mmol/L (ref 22–32)
Calcium: 8.5 mg/dL — ABNORMAL LOW (ref 8.9–10.3)
Chloride: 101 mmol/L (ref 98–111)
Creatinine, Ser: 1.37 mg/dL — ABNORMAL HIGH (ref 0.61–1.24)
GFR, Estimated: 57 mL/min — ABNORMAL LOW (ref >60)
Glucose, Bld: 98 mg/dL (ref 70–99)
Potassium: 4.6 mmol/L (ref 3.5–5.1)
Sodium: 137 mmol/L (ref 135–145)
Total Bilirubin: 0.4 mg/dL (ref 0.3–1.2)
Total Protein: 5.7 g/dL — ABNORMAL LOW (ref 6.5–8.1)

## 2021-06-13 LAB — CBG MONITORING, ED
Glucose-Capillary: 87 mg/dL (ref 70–99)
Glucose-Capillary: 107 mg/dL — ABNORMAL HIGH (ref 70–99)

## 2021-06-13 LAB — MRSA NEXT GEN BY PCR, NASAL: MRSA by PCR Next Gen: NOT DETECTED

## 2021-06-13 LAB — MAGNESIUM: Magnesium: 1.7 mg/dL (ref 1.7–2.4)

## 2021-06-13 LAB — HEMOGLOBIN A1C
Hgb A1c MFr Bld: 5.5 % (ref 4.8–5.6)
Mean Plasma Glucose: 111.15 mg/dL

## 2021-06-13 LAB — TROPONIN I (HIGH SENSITIVITY)
Troponin I (High Sensitivity): 12 ng/L (ref <18)
Troponin I (High Sensitivity): 12 ng/L (ref <18)

## 2021-06-13 MED ORDER — THIAMINE HCL 100 MG/ML IJ SOLN
500.0000 mg | Freq: Three times a day (TID) | INTRAVENOUS | Status: AC
  Administered 2021-06-13 – 2021-06-15 (×6): 500 mg via INTRAVENOUS
  Filled 2021-06-13 (×6): qty 5

## 2021-06-13 MED ORDER — NITROGLYCERIN 0.4 MG SL SUBL
0.4000 mg | SUBLINGUAL_TABLET | SUBLINGUAL | Status: DC | PRN
  Filled 2021-06-13: qty 25

## 2021-06-13 MED ORDER — NIRMATRELVIR/RITONAVIR (PAXLOVID)TABLET
3.0000 | ORAL_TABLET | Freq: Two times a day (BID) | ORAL | Status: AC
  Administered 2021-06-13 – 2021-06-17 (×9): 3 via ORAL
  Filled 2021-06-13: qty 30

## 2021-06-13 MED ORDER — BUPRENORPHINE HCL-NALOXONE HCL 8-2 MG SL SUBL
1.0000 | SUBLINGUAL_TABLET | Freq: Two times a day (BID) | SUBLINGUAL | Status: DC
  Administered 2021-06-13 – 2021-06-15 (×4): 1 via SUBLINGUAL
  Filled 2021-06-13 (×4): qty 1

## 2021-06-13 MED ORDER — ENOXAPARIN SODIUM 80 MG/0.8ML IJ SOSY
80.0000 mg | PREFILLED_SYRINGE | INTRAMUSCULAR | Status: DC
  Administered 2021-06-13 – 2021-06-19 (×7): 80 mg via SUBCUTANEOUS
  Filled 2021-06-13 (×7): qty 0.8

## 2021-06-13 NOTE — ED Notes (Signed)
Notified Attending of pt refusal for IV line, unable to give nitro at this. Pt bp on lower side as.well. Attending aware of situation. Will continue to try to talk pt into allowing IV access

## 2021-06-13 NOTE — Assessment & Plan Note (Addendum)
TSH elevated at 7 Free T4 normal Suspect sick euthyroid Will need repeat thyroid studies in 4 weeks

## 2021-06-13 NOTE — Assessment & Plan Note (Signed)
Please see management in delirium

## 2021-06-13 NOTE — BH Assessment (Signed)
TTS attempted to assess at 5:26a. Pt unable to participate in assessment Per Levada Dy RN, Pt is currently sleeping.  TTS to see Pt at a later time.

## 2021-06-13 NOTE — Assessment & Plan Note (Signed)
Continue PPI ?

## 2021-06-13 NOTE — Assessment & Plan Note (Addendum)
Appears to be asymptomatic Completed a course of Paxlovid Inflammatory markers minimally elevated On room air

## 2021-06-13 NOTE — ED Notes (Signed)
Pt reporting 8/10 chest pain. EDP and Attending notified and new orders to be placed. Pt denies SOB or radiation of pain. Attempted to get an IV but pt refusing at this time. He has refused multiple requests. Explained importance of IV access to give medicine in emergency and with chest pain. Pt continues to refuse.

## 2021-06-13 NOTE — Plan of Care (Signed)
°  Problem: Acute Rehab PT Goals(only PT should resolve) Goal: Pt Will Go Supine/Side To Sit Outcome: Progressing Flowsheets (Taken 06/13/2021 1147) Pt will go Supine/Side to Sit:  with min guard assist  with minimal assist Goal: Patient Will Transfer Sit To/From Stand Outcome: Progressing Flowsheets (Taken 06/13/2021 1147) Patient will transfer sit to/from stand: with moderate assist Goal: Pt Will Transfer Bed To Chair/Chair To Bed Outcome: Progressing Flowsheets (Taken 06/13/2021 1147) Pt will Transfer Bed to Chair/Chair to Bed:  with mod assist  with max assist Goal: Pt Will Ambulate Outcome: Progressing Flowsheets (Taken 06/13/2021 1147) Pt will Ambulate:  10 feet  with moderate assist  with rolling walker   11:48 AM, 06/13/21 Lonell Grandchild, MPT Physical Therapist with [facility] [phone] office [phone] mobile phone

## 2021-06-13 NOTE — ED Notes (Signed)
While giving patient morning medications, he stated, "All night the nurses was at the desk drinking a fifth of Vodka, I know it because I drink"  Reassured pt no one was drinking alcohol and he was safe.

## 2021-06-13 NOTE — Consult Note (Signed)
[facility] Telemedicine Consult   Requesting Provider: Pearletha Forge Consult Participants: Patient, bedside nurse   This consult was provided via telemedicine with 2-way video and audio communication. The patient/family was informed that care would be provided in this way and agreed to receive care in this manner.    Chief Complaint: Visual hallucinations  HPI: Patient is a 66 year old male with a history of alcoholism who presents with encephalopathy in the setting of recent gram-negative infection, recently discharged to facility.  While he was here, on 2/05, he had a thiamine checked which was significantly low at 30.  After discharge to his facility, it was noticed that he was having some visual hallucinations, thinking that people had visited who had not, seeing formed disturbing images.  This has since improved.  He is currently awake, alert, and appropriate.  Due to his confusion, however neurology has been consulted for further recommendations.  Of note, he does report some substernal chest pain, 8/10 in intensity which has been ongoing for about 30 minutes  Exam: Vitals:   06/13/21 0930 06/13/21 1222  BP: 120/89 98/75  Pulse: (!) 54 87  Resp: 18 17  Temp:  97.9 F (36.6 C)  SpO2: 92% 100%    General: Awake, alert, no apparent distress  1A: Level of Consciousness - 0 1B: Ask Month and Age - 0(he initially gives the month as March, but corrects himself to February) 1C: 'Blink Eyes' & 'Squeeze Hands' -0 2: Test Horizontal Extraocular Movements - 0 3: Test Visual Fields - (unable to assess, no additional examiner in room) 4: Test Facial Palsy - 0 5A: Test Left Arm Motor Drift - 0 5B: Test Right Arm Motor Drift - 0 6A: Test Left Leg Motor Drift - 0 6B: Test Right Leg Motor Drift - 0 7: Test Limb Ataxia - 0 8: Test Sensation -reports symmetric sensation 9: Test Language/Aphasia- 0 10: Test Dysarthria - 0 11: Test Extinction/Inattention - 0 NIHSS score: 0  He  is awake and alert, fixated on his recent difficulty getting a bedpan.  Imaging Reviewed: CT head-negative  Labs reviewed in epic and pertinent values follow: Ammonia 21 TSH-7.6 (was normal 10 days ago) B12-826 Creatinine 1.42 (appears baseline) BUN 15 B1 from 2/05 - 30 A.m. cortisol from 2/0 8-9.6    Assessment: 66 year old male with acute encephalopathy in the setting of recent gram-negative infection, multiple environmental changes, severe B1 deficiency.  I do think that he needs B1 repletion at a higher dose than he received previously, but given that he is already improving my suspicion is that it is already being treated with the lower dose.  Given the formed nature of the hallucinations, I think that this is most consistent with delirium.  This is much more common with gram-negative organisms which produce an endotoxin that is deliriogenic, as well as with environmental changes such as moving to his facility.  At this time, with his habitus I do not think that he is likely to be able to get an MRI, but given that I think this would be very low yield based on his semiology, I do not think we are losing much by not being able to get it.  I would certainly not pursue stroke work-up or other empirical work-up based on lack of ability to get it.  Likewise, given the lack of episodic nature, as well as the formed nature of the hallucinations I do not think seizures are at all likely.  At this time, I think that  this represents delirium in the setting of gram-negative infection +/- a component of Wernicke's encephalopathy given his severe B1 deficiency.  Recommendations:  1) high-dose thiamine 500 mg 3 times daily x2 days 2) I have communicated with primary team regarding the patient's complaint of chest pain, defer to IM 3) could consider checking T3/T4  4) no further recommendations from a neurological standpoint, neurology will be available on an as-needed basis, please call if further  questions or concerns.   Roland Rack, MD [facility] [phone]  If 7pm- 7am, please page neurology on call as listed in [place].

## 2021-06-13 NOTE — Assessment & Plan Note (Addendum)
Reports that he has been taking suboxone for last 2 years with good control of chronic pain (chest pain, abd pain and shoulder pain) This is prescribed by pain management clinic in [place] [facility]) Suboxone currently on hold due to confusion

## 2021-06-13 NOTE — Assessment & Plan Note (Addendum)
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

## 2021-06-13 NOTE — Assessment & Plan Note (Signed)
Recently started on midodrine for low blood pressure Continue to monitor

## 2021-06-13 NOTE — Evaluation (Signed)
Physical Therapy Evaluation Patient Details Name: Edwin Snyder MRN: [id number] DOB: [birth year] Today's Date: 06/13/2021  History of Present Illness  Edwin Snyder is a 66 y.o. 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  Patient was recently discharged on 2/9 after being treated for generalized weakness, septic shock from urinary tract infection complicated by transaminitis.  He was eventually discharged home after being treated with fosfomycin for Enterococcus.  Patient states he was doing okay but this morning he rolled off the bed and had a nontraumatic fall.  Per records it indicates that [name] had reported patient has been slightly disorientated hallucinating for the past 2 days.  In the ED patient was routineBlood work showed transaminitis but overall improving from previous admission, macrocytic anemia, normal ammonia level, negative UA.  CT of the head and cervical spine was negative.  Right upper quadrant ultrasound overall was unremarkable for any acute pathology.  Chest x-ray showed hiatal hernia.     When I saw the patient he was alert awake oriented X4.  He was not having active hallucinations during my visit.   Clinical Impression  Patient demonstrates slow labored movement for sitting up at bedside with, once seated unable to stand with multiple attempts due to BLE weakness, only able to partially lift bottom off bed.  Patient required Max assist to lift legs onto bed during sit to supine and demonstrated good return for using BUE/LE help reposition self when put back to bed.  Patient will benefit from continued skilled physical therapy in hospital and recommended venue below to increase strength, balance, endurance for safe ADLs and gait.         Recommendations for follow up therapy are one component of a  multi-disciplinary discharge planning process, led by the attending physician.  Recommendations may be updated based on patient status, additional functional criteria and insurance authorization.  Follow Up Recommendations Skilled nursing-short term rehab (<3 hours/day)    Assistance Recommended at Discharge Intermittent Supervision/Assistance  Patient can return home with the following  A lot of help with bathing/dressing/bathroom;A lot of help with walking and/or transfers;Two people to help with walking and/or transfers;Help with stairs or ramp for entrance;Assistance with cooking/housework    Equipment Recommendations None recommended by PT  Recommendations for Other Services       Functional Status Assessment Patient has had a recent decline in their functional status and demonstrates the ability to make significant improvements in function in a reasonable and predictable amount of time.     Precautions / Restrictions Precautions Precautions: Fall Restrictions Weight Bearing Restrictions: No      Mobility  Bed Mobility Overal bed mobility: Needs Assistance Bed Mobility: Supine to Sit, Sit to Supine     Supine to sit: Min assist Sit to supine: Mod assist, Max assist   General bed mobility comments: slow labored movement requring Mod/max assist to move legs during sit to supine    Transfers Overall transfer level: Needs assistance Equipment used: Rolling walker (2 wheels) Transfers: Sit to/from Stand Sit to Stand: Max assist           General transfer comment: limited to partially lifting bottom off bed    Ambulation/Gait                  Stairs  Wheelchair Mobility    Modified Rankin (Stroke Patients Only)       Balance Overall balance assessment: Needs assistance Sitting-balance support: Feet supported, No upper extremity supported Sitting balance-Leahy Scale: Fair Sitting balance - Comments: fair/good seated at EOB    Standing balance support: Bilateral upper extremity supported, During functional activity, Reliant on assistive device for balance Standing balance-Leahy Scale: Poor Standing balance comment: using RW                             Pertinent Vitals/Pain Pain Assessment Faces Pain Scale: Hurts whole lot Breathing: normal Negative Vocalization: none Facial Expression: smiling or inexpressive Body Language: relaxed Consolability: no need to console PAINAD Score: 0 Pain Location: low back Pain Descriptors / Indicators: Sore    Home Living Family/patient expects to be discharged to:: Private residence Living Arrangements: Alone Available Help at Discharge: Family;Neighbor;Available 24 hours/day Type of Home: Apartment Home Access: Ramped entrance       Home Layout: One level Home Equipment: Conservation officer, nature (2 wheels);Rollator (4 wheels);Wheelchair - Architectural technologist;Other (comment) Additional Comments: has hoyar lift    Prior Function Prior Level of Function : Needs assist       Physical Assist : Mobility (physical);ADLs (physical) Mobility (physical): Bed mobility;Transfers;Gait;Stairs ADLs (physical): Bathing;Dressing;Toileting;IADLs Mobility Comments: very short distanced household ambulator with assistance, uses wheelchair, electric scooter for longer distances ADLs Comments: Assited by family for toileting, bathing, dressing, and IADL's. Reports independence with grooming and feeding.     Hand Dominance   Dominant Hand: Right    Extremity/Trunk Assessment             Cervical / Trunk Assessment Cervical / Trunk Assessment: Normal  Communication   Communication: No difficulties  Cognition Arousal/Alertness: Awake/alert Behavior During Therapy: WFL for tasks assessed/performed Overall Cognitive Status: Within Functional Limits for tasks assessed                                          General  Comments      Exercises General Exercises - Upper Extremity Shoulder Flexion:  (Exercises completed with pt in bed with HOB elevated to near 90* upright position.) Shoulder ABduction:  (Pt attempted a couple reps without assist but needed min A to obtrain full ROM.) Shoulder Horizontal ABduction:  (x10 protraction seated)   Assessment/Plan    PT Assessment Patient needs continued PT services  PT Problem List Decreased strength;Decreased activity tolerance;Decreased balance;Decreased mobility       PT Treatment Interventions DME instruction;Gait training;Stair training;Functional mobility training;Therapeutic activities;Therapeutic exercise;Patient/family education;Balance training    PT Goals (Current goals can be found in the Care Plan section)  Acute Rehab PT Goals Patient Stated Goal: return home with family and neighbor to assist PT Goal Formulation: With patient Time For Goal Achievement: 06/27/21 Potential to Achieve Goals: Good    Frequency Min 3X/week     Co-evaluation PT/OT/SLP Co-Evaluation/Treatment: Yes             AM-PAC PT "6 Clicks" Mobility  Outcome Measure Help needed turning from your back to your side while in a flat bed without using bedrails?: A Lot Help needed moving from lying on your back to sitting on the side of a flat bed without using bedrails?: A Lot Help needed moving to and from a bed to a chair (including a wheelchair)?: Total  Help needed standing up from a chair using your arms (e.g., wheelchair or bedside chair)?: Total Help needed to walk in hospital room?: Total Help needed climbing 3-5 steps with a railing? : Total 6 Click Score: 8    End of Session   Activity Tolerance: Patient tolerated treatment well;Patient limited by fatigue;Patient limited by pain Patient left: in bed;with call bell/phone within reach Nurse Communication: Mobility status PT Visit Diagnosis: Unsteadiness on feet (R26.81);Other abnormalities of gait and  mobility (R26.89);Muscle weakness (generalized) (M62.81)    Time: 9784-7841 PT Time Calculation (min) (ACUTE ONLY): 28 min   Charges:     PT Treatments $Therapeutic Activity: 23-37 mins        11:45 AM, 06/13/21 Lonell Grandchild, MPT Physical Therapist with [facility] 336 [phone] office [phone] mobile phone

## 2021-06-13 NOTE — Assessment & Plan Note (Addendum)
Seen by PT with recommendations for continued SNF Discussed with daughter and it does not appear that family will have adequate resources to care for patient at this time and are requesting he returns to [facility]

## 2021-06-13 NOTE — Assessment & Plan Note (Signed)
Needs lifestyle changes

## 2021-06-13 NOTE — ED Notes (Signed)
Placed pt on [place] due to spo2 dropping to 70s on room air. Pt sleeping. Spo2 improved to 100% on 2l [place]

## 2021-06-13 NOTE — ED Notes (Signed)
Pt noted with sats at 88% on room air. Placed on 2L [place]. Sats increased to 100% . Continues to deny chest pain at this time.

## 2021-06-13 NOTE — Assessment & Plan Note (Addendum)
Recently completed course of fosfomycin for enterococcal UTI during last admission. Repeat UA on current admission shows improvement of pyuria

## 2021-06-13 NOTE — Progress Notes (Signed)
Progress Note   Patient: Edwin Snyder JXB:[id number] DOB: [birth year] DOA: 06/12/2021     0 DOS: the patient was seen and examined on 06/13/2021   Brief hospital course: 66 y/o male who was recently treated for UTI and discharged to SNF, returns to hospital with hallucinations and altered mental status. Patient was adequately treated for UTI. He was noted to have very low thiamine level on last admission and is currently on high dose thiamine replacement. CT head negative. No other signs of infection at present. Neurology following. He was also noted to be incidentally positive for covid  Assessment and Plan: * Acute metabolic encephalopathy- (present on admission) Please see management in delirium  Elevated TSH- (present on admission) Check Free T4, T3  Chronic pain syndrome- (present on admission) Reports that he has been taking suboxone for last 2 years with good control of chronic pain (chest pain, abd pain and shoulder pain) This is prescribed by pain management clinic in [place] [facility])  COVID-19 virus infection- (present on admission) Appears to be asymptomatic Will start on paxlovid Follow inflammatory markers  Delirium- (present on admission) CT head was negative Seen by neurology Further neuroimaging felt to be low yield Felt mental status changes may be related to recent UTI, may also have component of wernicke encephalopathy Started on high dose thiamine   Chest pain- (present on admission) Reported chest pain earlier today EKG and troponins negative Feels that pain is chronic pain that has improved with suboxone in the past Would probably benefit from outpatient cardiology evaluation since he does have some CAD on imaging  Obesity, Class III, BMI 40-49.9 (morbid obesity) ([place])- (present on admission) Needs lifestyle changes  UTI (urinary tract infection)- (present on admission) Recently completed course of fosfomycin for enterococcal UTI. Repeat UA on  admission shows improvement of pyuria  Hyperlipidemia- (present on admission) Continue on statin  GERD (gastroesophageal reflux disease)- (present on admission) Continue PPI  Essential hypertension- (present on admission) Recently started on midodrine for low blood pressure Continue to monitor   Generalized weakness Seen by PT with recommendations for continued SNF Patient expresses desire to go home I have encouraged him to discuss his wishes with his family to see if they can arrange appropriate support for him at home        Subjective: he says that he saw the nursing staff in the ED drinking alcohol at the nursing station. He knows he is in the hospital and that he was at rehab prior to admission  Physical Exam: Vitals:   06/13/21 1634 06/13/21 1939 06/13/21 2000 06/13/21 2028  BP: 127/73  101/86 101/86  Pulse: 75  77 80  Resp: 18  18 19   Temp: 98 F (36.7 C)  (!) 97.5 F (36.4 C) (!) 97.5 F (36.4 C)  TempSrc: Oral  Axillary Oral  SpO2: 100% 95% 94% 94%  Weight:      Height:       General exam: Alert, awake, oriented x 3 Respiratory system: Clear to auscultation. Respiratory effort normal. Cardiovascular system:RRR. No murmurs, rubs, gallops. Gastrointestinal system: Abdomen is nondistended, soft and nontender. No organomegaly or masses felt. Normal bowel sounds heard. Central nervous system: Alert and oriented. No focal neurological deficits. Extremities: No C/C/E, +pedal pulses Skin: No rashes, lesions or ulcers Psychiatry: Judgement and insight appear normal. Mood & affect appropriate.    Data Reviewed:  Reviewed labs from last admission, CT head and urine studies  Family Communication: discussed with daughter Glenard Haring over the  phone  Disposition: Status is: Inpatient  Remains inpatient appropriate because: needs high dose IV thiamine due to continued delirium    Planned Discharge Destination: Skilled nursing facility     Time spent: 35  minutes  Author: Kathie Dike, MD 06/13/2021 9:31 PM  For on call review [email or web address].

## 2021-06-13 NOTE — Assessment & Plan Note (Signed)
Continue on statin °

## 2021-06-13 NOTE — Assessment & Plan Note (Addendum)
Reported chest pain earlier in admission EKG and troponins negative Feels that pain is chronic pain that has improved with suboxone in the past Would probably benefit from outpatient cardiology evaluation since he does have some CAD on imaging

## 2021-06-13 NOTE — Hospital Course (Addendum)
66 y/o male who was recently treated for UTI and discharged to SNF, returns to hospital with hallucinations and altered mental status. Patient was adequately treated for UTI. He was noted to have very low thiamine level on last admission and is currently on high dose thiamine replacement. CT head negative. No other signs of infection at present. Neurology following. He was also noted to be incidentally positive for covid.  Patient has not had any hypoxia or dyspnea.  Repeat UA did not show any pyuria.  Patient had extensive work up for reversible causes of delirium which was essentially unrevealing except prior low thiamine levels which have been repleted.  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   During the hospitalization, patient continued to have waxing and waning episodes of confusion which was in part due to hospital delirium.  He was felt not to have capacity to make any medical decisions.  Although patient was resistant to go back to SNF, his family states they were unable to care for him and his daughter agreed with SNF.

## 2021-06-14 DIAGNOSIS — D539 Nutritional anemia, unspecified: Secondary | ICD-10-CM | POA: Diagnosis present

## 2021-06-14 DIAGNOSIS — N1831 Chronic kidney disease, stage 3a: Secondary | ICD-10-CM | POA: Diagnosis present

## 2021-06-14 LAB — FOLATE RBC
Folate, Hemolysate: 178 ng/mL
Folate, RBC: 664 ng/mL (ref >498)
Hematocrit: 26.8 % — ABNORMAL LOW (ref 37.5–51.0)

## 2021-06-14 LAB — COMPREHENSIVE METABOLIC PANEL
ALT: 40 U/L (ref 0–44)
AST: 32 U/L (ref 15–41)
Albumin: 2.7 g/dL — ABNORMAL LOW (ref 3.5–5.0)
Alkaline Phosphatase: 553 U/L — ABNORMAL HIGH (ref 38–126)
Anion gap: 12 (ref 5–15)
BUN: 20 mg/dL (ref 8–23)
CO2: 22 mmol/L (ref 22–32)
Calcium: 7.8 mg/dL — ABNORMAL LOW (ref 8.9–10.3)
Chloride: 98 mmol/L (ref 98–111)
Creatinine, Ser: 1.63 mg/dL — ABNORMAL HIGH (ref 0.61–1.24)
GFR, Estimated: 46 mL/min — ABNORMAL LOW (ref >60)
Glucose, Bld: 93 mg/dL (ref 70–99)
Potassium: 4.6 mmol/L (ref 3.5–5.1)
Sodium: 132 mmol/L — ABNORMAL LOW (ref 135–145)
Total Bilirubin: 0.5 mg/dL (ref 0.3–1.2)
Total Protein: 5.4 g/dL — ABNORMAL LOW (ref 6.5–8.1)

## 2021-06-14 LAB — CBC
HCT: 25.6 % — ABNORMAL LOW (ref 39.0–52.0)
Hemoglobin: 7.6 g/dL — ABNORMAL LOW (ref 13.0–17.0)
MCH: 30.8 pg (ref 26.0–34.0)
MCHC: 29.7 g/dL — ABNORMAL LOW (ref 30.0–36.0)
MCV: 103.6 fL — ABNORMAL HIGH (ref 80.0–100.0)
Platelets: 179 10*3/uL (ref 150–400)
RBC: 2.47 MIL/uL — ABNORMAL LOW (ref 4.22–5.81)
RDW: 17.1 % — ABNORMAL HIGH (ref 11.5–15.5)
WBC: 4.2 10*3/uL (ref 4.0–10.5)
nRBC: 0 % (ref 0.0–0.2)

## 2021-06-14 LAB — FERRITIN: Ferritin: 579 ng/mL — ABNORMAL HIGH (ref 24–336)

## 2021-06-14 LAB — C-REACTIVE PROTEIN: CRP: 0.7 mg/dL (ref <1.0)

## 2021-06-14 LAB — GLUCOSE, CAPILLARY
Glucose-Capillary: 90 mg/dL (ref 70–99)
Glucose-Capillary: 118 mg/dL — ABNORMAL HIGH (ref 70–99)
Glucose-Capillary: 94 mg/dL (ref 70–99)
Glucose-Capillary: 79 mg/dL (ref 70–99)

## 2021-06-14 LAB — T4, FREE: Free T4: 0.84 ng/dL (ref 0.61–1.12)

## 2021-06-14 LAB — MAGNESIUM: Magnesium: 1.5 mg/dL — ABNORMAL LOW (ref 1.7–2.4)

## 2021-06-14 LAB — D-DIMER, QUANTITATIVE: D-Dimer, Quant: 1.32 ug/mL-FEU — ABNORMAL HIGH (ref 0.00–0.50)

## 2021-06-14 MED ORDER — MAGNESIUM SULFATE 4 GM/100ML IV SOLN
4.0000 g | Freq: Once | INTRAVENOUS | Status: AC
Start: 2021-06-14 — End: 2021-06-14
  Administered 2021-06-14: 4 g via INTRAVENOUS
  Filled 2021-06-14: qty 100

## 2021-06-14 NOTE — [place] FL2 (Signed)
[place] MEDICAID FL2 LEVEL OF CARE SCREENING TOOL     IDENTIFICATION  Patient Name: Edwin Snyder Birthdate: [birth year] Sex: male Admission Date (Current Location): 06/12/2021  [place] and [place] Number:  [place] [id number] [place] and Address:  [place] [address] [place]      Provider Number: [id number]  Attending Physician Name and Address:  Kathie Dike, MD  Relative Name and Phone Number:  Carolynn Sayers, Niece [phone]    Current Level of Care: Hospital Recommended Level of Care: [place] Prior Approval Number:    Date Approved/Denied:   PASRR Number: [id number]  Discharge Plan: SNF    Current Diagnoses: Patient Active Problem List   Diagnosis Date Noted   Delirium 06/13/2021   COVID-19 virus infection 06/13/2021   Chronic pain syndrome 06/13/2021   Elevated TSH 02/40/9735   Acute metabolic encephalopathy 32/99/2426   Chest pain 06/04/2021   Hypotension 06/04/2021   Hypokalemia 06/03/2021   Hypomagnesemia 06/03/2021   Protein-calorie malnutrition, moderate ([place]) 06/03/2021   Essential hypertension 06/03/2021   GERD (gastroesophageal reflux disease) 06/03/2021   Hyperlipidemia 06/03/2021   UTI (urinary tract infection) 06/03/2021   Obesity, Class III, BMI 40-49.9 (morbid obesity) ([place]) 06/03/2021   Generalized weakness 06/02/2021    Orientation RESPIRATION BLADDER Height & Weight     Self, Time, Situation, Place  Normal (2L) Incontinent Weight: (!) 342 lb 2.5 oz (155.2 kg) Height:  6\' 1"  (185.4 cm)  BEHAVIORAL SYMPTOMS/MOOD NEUROLOGICAL BOWEL NUTRITION STATUS      Incontinent Diet (heart healthy)  AMBULATORY STATUS COMMUNICATION OF NEEDS Skin   Extensive Assist Verbally Normal                       Personal Care Assistance Level of Assistance  Bathing, Feeding, Dressing   Feeding assistance: Limited assistance Dressing Assistance: Maximum assistance     Functional  Limitations Info  Sight, Hearing, Speech Sight Info: Adequate   Speech Info: Adequate    SPECIAL CARE FACTORS FREQUENCY  PT (By licensed PT)     PT Frequency: 5x/week              Contractures Contractures Info: Not present    Additional Factors Info  Code Status, Allergies, Isolation Precautions Code Status Info: Full Code Allergies Info: Clindamycin/lincomycin, penicillins, Ivp dye iodinaed contrast media     Isolation Precautions Info: 06/12/21 COVID+     Current Medications (06/14/2021):  This is the current hospital active medication list Current Facility-Administered Medications  Medication Dose Route Frequency Provider Last Rate Last Admin   acetaminophen (TYLENOL) tablet 650 mg  650 mg Oral Q6H PRN Amin, Ankit Chirag, MD       buprenorphine-naloxone (SUBOXONE) 8-2 mg per SL tablet 1 tablet  1 tablet Sublingual BID Kathie Dike, MD   1 tablet at 06/14/21 0820   enoxaparin (LOVENOX) injection 80 mg  80 mg Subcutaneous Q24H Kathie Dike, MD   80 mg at 06/13/21 2213   guaiFENesin (ROBITUSSIN) 100 MG/5ML liquid 5 mL  5 mL Oral Q4H PRN Amin, Ankit Chirag, MD       hydrALAZINE (APRESOLINE) injection 10 mg  10 mg Intravenous Q4H PRN Amin, Ankit Chirag, MD       insulin aspart (novoLOG) injection 0-5 Units  0-5 Units Subcutaneous QHS Amin, Ankit Chirag, MD       insulin aspart (novoLOG) injection 0-6 Units  0-6 Units Subcutaneous TID WC Amin, Jeanella Flattery, MD  ipratropium-albuterol (DUONEB) 0.5-2.5 (3) MG/3ML nebulizer solution 3 mL  3 mL Nebulization Q4H PRN Amin, Ankit Chirag, MD       metoprolol tartrate (LOPRESSOR) injection 5 mg  5 mg Intravenous Q4H PRN Amin, Ankit Chirag, MD       midodrine (PROAMATINE) tablet 10 mg  10 mg Oral TID WC Amin, Ankit Chirag, MD   10 mg at 06/14/21 1216   nirmatrelvir/ritonavir EUA (PAXLOVID) 3 tablet  3 tablet Oral BID Kathie Dike, MD   3 tablet at 06/14/21 0920   nitroGLYCERIN (NITROSTAT) SL tablet 0.4 mg  0.4 mg Sublingual  Q5 min PRN Kathie Dike, MD       pantoprazole (PROTONIX) EC tablet 40 mg  40 mg Oral Daily Amin, Ankit Chirag, MD   40 mg at 06/14/21 0820   senna-docusate (Senokot-S) tablet 1 tablet  1 tablet Oral QHS PRN Amin, Ankit Chirag, MD       senna-docusate (Senokot-S) tablet 1 tablet  1 tablet Oral BID Amin, Jeanella Flattery, MD   1 tablet at 06/14/21 0315   thiamine 500mg  in normal saline (42ml) IVPB  500 mg Intravenous Q8H Kathie Dike, MD 100 mL/hr at 06/14/21 0624 500 mg at 06/14/21 9458     Discharge Medications: Please see discharge summary for a list of discharge medications.  Relevant Imaging Results:  Relevant Lab Results:   Additional Information SS# [id number]  Ihor Gully, LCSW

## 2021-06-14 NOTE — Assessment & Plan Note (Addendum)
Baseline creatinine approximately 1.3 Currently creatinine is 1.38 at time of dc Continue to follow

## 2021-06-14 NOTE — Plan of Care (Signed)

## 2021-06-14 NOTE — Progress Notes (Signed)
Patient ID: Edwin Snyder, male   DOB: [birth year], 66 y.o.   MRN: [id number]  This provider spoke with Dr. Kathie Dike, attending MD with a request to cancel the TTS consult due to patient being treated for delirium, which is the likely cause for his visual hallucinations. Dr. Roderic Palau is in agreement with cancelling the consult. As always  please feel free to reach back out to psychiatry if needed for this patient.

## 2021-06-14 NOTE — TOC Progression Note (Signed)
Transition of Care [facility]) - Progression Note    Patient Details  Name: Edwin Snyder MRN: [id number] Date of Birth: Dec 21, 1955  Transition of Care [facility]) CM/SW Contact  Ihor Gully, LCSW Phone Number: 06/14/2021, 4:44 PM  Clinical Narrative:    Spoke with daughter, Joellen Jersey and Glenard Haring, via conference call. Daughters want patient to return to SNF but understand that we cannot force him. They will try to convince him to go to SNF.  Discussed that patient is authorized to return to the facility if he chooses to do so.  Patient has a lift at home but does not use it. Patient uses the bathroom in the bed and Butch Penny comes to change him. He urinates in a bottle and if it gets on the sheets he uses a blow dryer to dry the sheets.  His PCP will not write scripts or order [facility] because he cannot come in to the office. Daughter's provided the number to [facility] to identify if they can see patient in home should he go home.     Expected Discharge Plan: [place] Barriers to Discharge: Continued Medical Work up  Expected Discharge Plan and Services Expected Discharge Plan: [place] Choice: [place] arrangements for the past 2 months: [place]                                       Social Determinants of Health (SDOH) Interventions    Readmission Risk Interventions Readmission Risk Prevention Plan 06/07/2021  Transportation Screening Complete  PCP or Specialist Appt within 5-7 Days Complete  Home Care Screening Complete  Medication Review (RN CM) Complete  Some recent data might be hidden

## 2021-06-14 NOTE — Progress Notes (Signed)
Progress Note   Patient: Edwin Snyder IRC:[id number] DOB: 09/12/1955 DOA: 06/12/2021     1 DOS: the patient was seen and examined on 06/14/2021   Brief hospital course: 66 y/o male who was recently treated for UTI and discharged to SNF, returns to hospital with hallucinations and altered mental status. Patient was adequately treated for UTI. He was noted to have very low thiamine level on last admission and is currently on high dose thiamine replacement. CT head negative. No other signs of infection at present. Neurology following. He was also noted to be incidentally positive for covid  Assessment and Plan: * Acute metabolic encephalopathy- (present on admission) Please see management in delirium  Stage 3a chronic kidney disease (CKD) ([place])- (present on admission) Baseline creatinine approximately 1.3 Continue to follow  Macrocytic anemia- (present on admission) b12 and folate normal No signs of bleeding Check fobt Transfuse for hemoglobin <7  Elevated TSH- (present on admission) TSH elevated at 7 Free T4 normal Suspect sick euthyroid Will need repeat thyroid studies in 4 weeks  Chronic pain syndrome- (present on admission) Reports that he has been taking suboxone for last 2 years with good control of chronic pain (chest pain, abd pain and shoulder pain) This is prescribed by pain management clinic in [place] [facility])  COVID-19 virus infection- (present on admission) Appears to be asymptomatic Started on paxlovid Inflammatory markers minimally elevated On room air  Delirium- (present on admission) CT head was negative Seen by neurology Further neuroimaging felt to be low yield Felt mental status changes may be related to recent UTI, may also have component of wernicke encephalopathy Started on high dose thiamine  Overall mental status appears to be improving  Chest pain- (present on admission) Reported chest pain earlier today EKG and troponins negative Feels  that pain is chronic pain that has improved with suboxone in the past Would probably benefit from outpatient cardiology evaluation since he does have some CAD on imaging  Obesity, Class III, BMI 40-49.9 (morbid obesity) ([place])- (present on admission) Needs lifestyle changes  UTI (urinary tract infection)- (present on admission) Recently completed course of fosfomycin for enterococcal UTI. Repeat UA on admission shows improvement of pyuria  Hyperlipidemia- (present on admission) Continue on statin  GERD (gastroesophageal reflux disease)- (present on admission) Continue PPI  Essential hypertension- (present on admission) Recently started on midodrine for low blood pressure Continue to monitor   Generalized weakness Seen by PT with recommendations for continued SNF Patient expresses desire to go home I have encouraged him to discuss his wishes with his family to see if they can arrange appropriate support for him at home        Subjective: Patient denies any complaints today  Physical Exam: Vitals:   06/13/21 2000 06/13/21 2028 06/14/21 0517 06/14/21 1330  BP: 101/86 101/86 (!) 91/55 130/78  Pulse: 77 80 79 80  Resp: 18 19 20 20   Temp: (!) 97.5 F (36.4 C) (!) 97.5 F (36.4 C) 97.6 F (36.4 C) 98.2 F (36.8 C)  TempSrc: Axillary Oral Oral Oral  SpO2: 94% 94% 96% 98%  Weight:      Height:       General exam: Alert, awake, oriented x 3 Respiratory system: Clear to auscultation. Respiratory effort normal. Cardiovascular system:RRR. No murmurs, rubs, gallops. Gastrointestinal system: Abdomen is nondistended, soft and nontender. No organomegaly or masses felt. Normal bowel sounds heard. Central nervous system: Alert and oriented. No focal neurological deficits. Extremities: No C/C/E, +pedal pulses Skin: No rashes,  lesions or ulcers Psychiatry: Judgement and insight appear normal. Mood & affect appropriate.    Data Reviewed:  Reviewed cbc and chemistry  Family  Communication: updated daughter over the phone  Disposition: Status is: Inpatient  Remains inpatient appropriate because: continue course of IV thiamine    Planned Discharge Destination: Home     Time spent: 35 minutes  Author: Kathie Dike, MD 06/14/2021 7:36 PM  For on call review [email or web address].

## 2021-06-14 NOTE — TOC Progression Note (Signed)
Transition of Care [facility]) - Progression Note    Patient Details  Name: Edwin Snyder MRN: [id number] Date of Birth: 16-Oct-1955  Transition of Care [facility]) CM/SW Contact  Ihor Gully, LCSW Phone Number: 06/14/2021, 4:24 PM  Clinical Narrative:    Patient's authorization for SNF is approved: approved 2/17 - 2/21, next review 2/21, Candace Cruise id [id number], should he make the decision to go back to SNF.    Expected Discharge Plan: [place] Barriers to Discharge: Continued Medical Work up  Expected Discharge Plan and Services Expected Discharge Plan: [place] Choice: [place] arrangements for the past 2 months: [place]                                       Social Determinants of Health (SDOH) Interventions    Readmission Risk Interventions Readmission Risk Prevention Plan 06/07/2021  Transportation Screening Complete  PCP or Specialist Appt within 5-7 Days Complete  Home Care Screening Complete  Medication Review (RN CM) Complete  Some recent data might be hidden

## 2021-06-14 NOTE — Plan of Care (Signed)
° °  Problem: Acute Rehab OT Goals (only OT should resolve) Goal: Pt. Will Perform Grooming Flowsheets (Taken 06/14/2021 0917) Pt Will Perform Grooming:  standing  with mod assist Goal: Pt. Will Perform Lower Body Bathing Flowsheets (Taken 06/14/2021 0917) Pt Will Perform Lower Body Bathing:  with min assist  sitting/lateral leans  with adaptive equipment Goal: Pt. Will Perform Upper Body Dressing Flowsheets (Taken 06/14/2021 0917) Pt Will Perform Upper Body Dressing:  with modified independence  sitting Goal: Pt. Will Perform Lower Body Dressing Flowsheets (Taken 06/14/2021 0917) Pt Will Perform Lower Body Dressing:  with mod assist  with min assist  sitting/lateral leans  with adaptive equipment Goal: Pt. Will Transfer To Toilet [place] (Taken 06/14/2021 [phone]) Pt Will Transfer to Toilet:  with mod assist  stand pivot transfer  bedside commode Goal: Pt/Caregiver Will Perform Home Exercise Program Flowsheets (Taken 06/14/2021 [phone]) Pt/caregiver will Perform Home Exercise Program:  Increased strength  Both right and left upper extremity  Independently    Leslie Langille OT, MOT

## 2021-06-14 NOTE — TOC Initial Note (Signed)
Transition of Care [facility]) - Initial/Assessment Note    Patient Details  Name: Edwin Snyder MRN: [id number] Date of Birth: [birth year]  Transition of Care [facility]) CM/SW Contact:    Ihor Gully, LCSW Phone Number: 06/14/2021, 1:33 PM  Clinical Narrative:                 Patient has been at [facility] for a week. Admitted COVID+. Patient is unsure as to whether he wants to return to [place]. He indicates that he wants to go home. Patient states that he has spoken with his niece, Butch Penny, daughter, and neighbors/friends who have all agreed to come in and assist him if he goes home. Spoke with daughter, Glenard Haring. Patient has not ambulated in about 2.5 months due to multiple falls. Discussed patient's desire to d/c home. Glenard Haring states that she would speak with family and return contact with TOC. Spoke with niece, Butch Penny, who indicates that if patient is going to discharge home, she will help him. She states that she typically goes to patient's home 5-10 times per day to assist him.  Insurance authorization for SNF submitted in the event that patient chooses to go home.    Expected Discharge Plan: Skilled Nursing Facility Barriers to Discharge: Continued Medical Work up   Patient Goals and CMS Choice Patient states their goals for this hospitalization and ongoing recovery are:: patient wants to go home      Expected Discharge Plan and Services Expected Discharge Plan: [place] Acute Care Choice: [place] arrangements for the past 2 months: [place]                                      Prior Living Arrangements/Services Living arrangements for the past 2 months: [place] Lives with:: Facility Resident Patient language and need for interpreter reviewed:: Yes Do you feel safe going back to the place where you live?: Yes      Need for Family Participation in Patient Care: Yes (Comment) Care giver support system in  place?: Yes (comment)   Criminal Activity/Legal Involvement Pertinent to Current Situation/Hospitalization: No - Comment as needed  Activities of Daily Living   ADL Screening (condition at time of admission) Patient's cognitive ability adequate to safely complete daily activities?: Yes Is the patient deaf or have difficulty hearing?: No Does the patient have difficulty seeing, even when wearing glasses/contacts?: No Does the patient have difficulty concentrating, remembering, or making decisions?: Yes Patient able to express need for assistance with ADLs?: Yes Does the patient have difficulty dressing or bathing?: Yes Independently performs ADLs?: No Communication: Needs assistance Is this a change from baseline?: Pre-admission baseline Dressing (OT): Needs assistance Is this a change from baseline?: Pre-admission baseline Grooming: Needs assistance Is this a change from baseline?: Pre-admission baseline Feeding: Independent Bathing: Needs assistance Is this a change from baseline?: Pre-admission baseline Toileting: Needs assistance Is this a change from baseline?: Pre-admission baseline In/Out Bed: Needs assistance Is this a change from baseline?: Pre-admission baseline Walks in Home: Independent with device (comment) Is this a change from baseline?: Pre-admission baseline Does the patient have difficulty walking or climbing stairs?: Yes Weakness of Legs: Both Weakness of Arms/Hands: None  Permission Sought/Granted Permission sought to share information with : Family Supports    Share Information with NAME: Butch Penny, niece, Glenard Haring, daughter  Emotional Assessment     Affect (typically observed): Appropriate Orientation: : Oriented to Self, Oriented to Place, Oriented to  Time, Oriented to Situation Alcohol / Substance Use: Not Applicable Psych Involvement: No (comment)  Admission diagnosis:  Cholangitis [K83.09] Hallucinations [V69.4] Acute metabolic  encephalopathy [H03.88] Delirium [R41.0] Patient Active Problem List   Diagnosis Date Noted   Delirium 06/13/2021   COVID-19 virus infection 06/13/2021   Chronic pain syndrome 06/13/2021   Elevated TSH 82/80/0349   Acute metabolic encephalopathy 17/91/5056   Chest pain 06/04/2021   Hypotension 06/04/2021   Hypokalemia 06/03/2021   Hypomagnesemia 06/03/2021   Protein-calorie malnutrition, moderate ([place]) 06/03/2021   Essential hypertension 06/03/2021   GERD (gastroesophageal reflux disease) 06/03/2021   Hyperlipidemia 06/03/2021   UTI (urinary tract infection) 06/03/2021    Class: Acute   Obesity, Class III, BMI 40-49.9 (morbid obesity) ([place]) 06/03/2021   Generalized weakness 06/02/2021   PCP:  Hal Morales, DO Pharmacy:   [place], [place] [address] Phone: [phone] Fax: [phone]     Social Determinants of Health (SDOH) Interventions    Readmission Risk Interventions Readmission Risk Prevention Plan 06/07/2021  Transportation Screening Complete  PCP or Specialist Appt within 5-7 Days Complete  Home Care Screening Complete  Medication Review (RN CM) Complete  Some recent data might be hidden

## 2021-06-14 NOTE — Progress Notes (Signed)
Physical Therapy Treatment Patient Details Name: Edwin Snyder MRN: [id number] DOB: 01/09/1956 Today's Date: 06/14/2021   History of Present Illness Edwin Snyder is a 66 y.o. 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  Patient was recently discharged on 2/9 after being treated for generalized weakness, septic shock from urinary tract infection complicated by transaminitis.  He was eventually discharged home after being treated with fosfomycin for Enterococcus.  Patient states he was doing okay but this morning he rolled off the bed and had a nontraumatic fall.  Per records it indicates that [name] had reported patient has been slightly disorientated hallucinating for the past 2 days.  In the ED patient was routineBlood work showed transaminitis but overall improving from previous admission, macrocytic anemia, normal ammonia level, negative UA.  CT of the head and cervical spine was negative.  Right upper quadrant ultrasound overall was unremarkable for any acute pathology.  Chest x-ray showed hiatal hernia.     When I saw the patient he was alert awake oriented X4.  He was not having active hallucinations during my visit.    PT Comments    Patient demonstrates fair/good return for sitting up at bedside with [facility] raised and use of bed rail with slow labored movement, tolerated sit to stands x 3 trials, stood up to 1-2 minutes each before requesting to sit due to c/o weakness and unable to take steps.  Patient required Mod assist to move legs when getting back in bed and demonstrated good return for using BUE/LE to help reposition self.  Patient will benefit from continued skilled physical therapy in hospital and recommended venue below to increase strength, balance, endurance for safe ADLs and gait.     Recommendations for  follow up therapy are one component of a multi-disciplinary discharge planning process, led by the attending physician.  Recommendations may be updated based on patient status, additional functional criteria and insurance authorization.  Follow Up Recommendations  Skilled nursing-short term rehab (<3 hours/day)     Assistance Recommended at Discharge Frequent or constant Supervision/Assistance  Patient can return home with the following A lot of help with bathing/dressing/bathroom;A lot of help with walking and/or transfers;Two people to help with walking and/or transfers;Help with stairs or ramp for entrance;Assistance with cooking/housework   Equipment Recommendations  None recommended by PT    Recommendations for Other Services       Precautions / Restrictions Precautions Precautions: Fall Restrictions Weight Bearing Restrictions: No     Mobility  Bed Mobility Overal bed mobility: Needs Assistance Bed Mobility: Supine to Sit, Sit to Supine     Supine to sit: Min assist, HOB elevated Sit to supine: Mod assist   General bed mobility comments: increased time, labored movement, required use of bed rail and HOB elevated    Transfers Overall transfer level: Needs assistance Equipment used: Rolling walker (2 wheels) Transfers: Sit to/from Stand Sit to Stand: Max assist, +2 physical assistance           General transfer comment: required 2 person Max assist using heavy duty RW    Ambulation/Gait                   Stairs             Wheelchair Mobility    Modified Rankin (Stroke Patients Only)  Balance Overall balance assessment: Needs assistance Sitting-balance support: Feet supported, No upper extremity supported Sitting balance-Leahy Scale: Fair Sitting balance - Comments: fair/good seated at EOB   Standing balance support: Bilateral upper extremity supported, During functional activity, Reliant on assistive device for balance Standing  balance-Leahy Scale: Poor Standing balance comment: using RW                            Cognition Arousal/Alertness: Awake/alert Behavior During Therapy: WFL for tasks assessed/performed Overall Cognitive Status: Within Functional Limits for tasks assessed                                          Exercises      General Comments        Pertinent Vitals/Pain Pain Assessment Pain Assessment: Faces Faces Pain Scale: Hurts little more Pain Location: R ankle Pain Descriptors / Indicators: Grimacing, Guarding Pain Intervention(s): Limited activity within patient's tolerance, Monitored during session, Repositioned    Home Living Family/patient expects to be discharged to:: Private residence Living Arrangements: Alone Available Help at Discharge: Family;Neighbor;Available 24 hours/day Type of Home: Apartment Home Access: Ramped entrance       Home Layout: One level Home Equipment: Conservation officer, nature (2 wheels);Rollator (4 wheels);Wheelchair - Architectural technologist;Other (comment) Additional Comments: has hoyar lift    Prior Function            PT Goals (current goals can now be found in the care plan section) Acute Rehab PT Goals Patient Stated Goal: return home with family and neighbor to assist PT Goal Formulation: With patient Time For Goal Achievement: 06/27/21 Potential to Achieve Goals: Good Progress towards PT goals: Progressing toward goals    Frequency    Min 3X/week      PT Plan Current plan remains appropriate    Co-evaluation PT/OT/SLP Co-Evaluation/Treatment: Yes Reason for Co-Treatment: To address functional/ADL transfers PT goals addressed during session: Mobility/safety with mobility;Balance;Proper use of DME;Strengthening/ROM OT goals addressed during session: ADL's and self-care      AM-PAC PT "6 Clicks" Mobility   Outcome Measure  Help needed turning from your back to your side while in a  flat bed without using bedrails?: A Little Help needed moving from lying on your back to sitting on the side of a flat bed without using bedrails?: A Little Help needed moving to and from a bed to a chair (including a wheelchair)?: Total Help needed standing up from a chair using your arms (e.g., wheelchair or bedside chair)?: A Lot Help needed to walk in hospital room?: Total Help needed climbing 3-5 steps with a railing? : Total 6 Click Score: 11    End of Session   Activity Tolerance: Patient tolerated treatment well;Patient limited by fatigue Patient left: in bed;with call bell/phone within reach Nurse Communication: Mobility status PT Visit Diagnosis: Unsteadiness on feet (R26.81);Other abnormalities of gait and mobility (R26.89);Muscle weakness (generalized) (M62.81)     Time: 7342-8768 PT Time Calculation (min) (ACUTE ONLY): 22 min  Charges:  $Therapeutic Activity: 8-22 mins                     12:06 PM, 06/14/21 Lonell Grandchild, MPT Physical Therapist with [facility] [phone] office [phone] mobile phone

## 2021-06-14 NOTE — Evaluation (Signed)
Occupational Therapy Evaluation Patient Details Name: Edwin Snyder MRN: [id number] DOB: April 30, 1955 Today's Date: 06/14/2021   History of Present Illness Edwin Snyder is a 66 y.o. 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  Patient was recently discharged on 2/9 after being treated for generalized weakness, septic shock from urinary tract infection complicated by transaminitis.  He was eventually discharged home after being treated with fosfomycin for Enterococcus.  Patient states he was doing okay but this morning he rolled off the bed and had a nontraumatic fall.  Per records it indicates that [name] had reported patient has been slightly disorientated hallucinating for the past 2 days.  In the ED patient was routineBlood work showed transaminitis but overall improving from previous admission, macrocytic anemia, normal ammonia level, negative UA.  CT of the head and cervical spine was negative.  Right upper quadrant ultrasound overall was unremarkable for any acute pathology.  Chest x-ray showed hiatal hernia.     When I saw the patient he was alert awake oriented X4.  He was not having active hallucinations during my visit.   Clinical Impression   Pt agreeable to OT evaluation with PT treating. Pt demonstrated need for Min A for supine to sit with HOB elevated and Mod A for sit to supine. Pt able to scoot up in bed with min A while supine. Pt able to stand x3 reps this date with max A with assist on either side from therapists. Pt in generally weak but agreeable and making an effort. Pt left in bed with call bell within reach. Pt will benefit from continued OT in the hospital and recommended venue below to increase strength, balance, and endurance for safe ADL's.        Recommendations for follow up therapy are one  component of a multi-disciplinary discharge planning process, led by the attending physician.  Recommendations may be updated based on patient status, additional functional criteria and insurance authorization.   Follow Up Recommendations  Skilled nursing-short term rehab (<3 hours/day)    Assistance Recommended at Discharge Intermittent Supervision/Assistance  Patient can return home with the following Two people to help with walking and/or transfers;A lot of help with bathing/dressing/bathroom;Assistance with cooking/housework;Assist for transportation;Help with stairs or ramp for entrance    Functional Status Assessment  Patient has had a recent decline in their functional status and demonstrates the ability to make significant improvements in function in a reasonable and predictable amount of time.  Equipment Recommendations  None recommended by OT           Precautions / Restrictions Precautions Precautions: Fall Restrictions Weight Bearing Restrictions: No      Mobility Bed Mobility Overal bed mobility: Needs Assistance Bed Mobility: Supine to Sit, Sit to Supine     Supine to sit: Min assist Sit to supine: Mod assist   General bed mobility comments: slow labored movement; assist to move B LE during sit to supine.    Transfers Overall transfer level: Needs assistance Equipment used: Rolling walker (2 wheels) Transfers: Sit to/from Stand Sit to Stand: Max assist           General transfer comment: Able to stand for ~3 reps at EOB but unable to take steps to head of bed.      Balance Overall balance assessment: Needs assistance Sitting-balance support: Feet supported, No upper  extremity supported Sitting balance-Leahy Scale: Fair Sitting balance - Comments: fair/good seated at EOB   Standing balance support: Bilateral upper extremity supported, During functional activity, Reliant on assistive device for balance Standing balance-Leahy Scale: Poor Standing  balance comment: using RW                           ADL either performed or assessed with clinical judgement   ADL Overall ADL's : Needs assistance/impaired     Grooming: Set up;Sitting;Applying deodorant       Lower Body Bathing: Maximal assistance;Total assistance;Sitting/lateral leans   Upper Body Dressing : Set up;Sitting   Lower Body Dressing: Maximal assistance;Total assistance;Bed level   Toilet Transfer: Total assistance                   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.) Ability to See in Adequate Light: 2 Moderately impaired Patient Visual Report: No change from baseline Vision Assessment?: No apparent visual deficits     Perception     Praxis      Pertinent Vitals/Pain Pain Assessment Pain Assessment: Faces Faces Pain Scale: Hurts little more Pain Location: R ankle Pain Descriptors / Indicators: Grimacing, Guarding Pain Intervention(s): Limited activity within patient's tolerance, Monitored during session, Repositioned     Hand Dominance Right   Extremity/Trunk Assessment Upper Extremity Assessment Upper Extremity Assessment: Generalized weakness   Lower Extremity Assessment Lower Extremity Assessment: Defer to PT evaluation   Cervical / Trunk Assessment Cervical / Trunk Assessment: Normal   Communication Communication Communication: No difficulties   Cognition Arousal/Alertness: Awake/alert Behavior During Therapy: WFL for tasks assessed/performed Overall Cognitive Status: Within Functional Limits for tasks assessed                                                        Home Living Family/patient expects to be discharged to:: Private residence Living Arrangements: Alone Available Help at Discharge: Family;Neighbor;Available 24 hours/day Type of Home: Apartment Home Access: Ramped entrance     Home Layout:  One level     Bathroom Shower/Tub: Teacher, early years/pre: Handicapped height Bathroom Accessibility: Yes   Home Equipment: Conservation officer, nature (2 wheels);Rollator (4 wheels);Wheelchair - Architectural technologist;Other (comment)   Additional Comments: has hoyar lift      Prior Functioning/Environment Prior Level of Function : Needs assist       Physical Assist : Mobility (physical);ADLs (physical) Mobility (physical): Bed mobility;Transfers;Gait;Stairs ADLs (physical): Bathing;Dressing;Toileting;IADLs Mobility Comments: very short distanced household ambulator with assistance, uses wheelchair, electric scooter for longer distances ADLs Comments: Assited by family for toileting, bathing, dressing, and IADL's. Reports independence with grooming and feeding.        OT Problem List: Decreased strength;Decreased activity tolerance;Impaired balance (sitting and/or standing);Impaired vision/perception;Obesity;Pain      OT Treatment/Interventions: Self-care/ADL training;Therapeutic exercise;Therapeutic activities;Visual/perceptual remediation/compensation;Patient/family education;Balance training;DME and/or AE instruction    OT Goals(Current goals can be found in the care plan section) Acute Rehab OT Goals Patient Stated Goal: return home OT Goal Formulation: With patient Time For Goal Achievement: 06/20/21 Potential to Achieve Goals: Fair  OT Frequency: Min 2X/week    Co-evaluation PT/OT/SLP Co-Evaluation/Treatment: Yes Reason for Co-Treatment: To address functional/ADL transfers  OT goals addressed during session: ADL's and self-care                       End of Session Equipment Utilized During Treatment: Rolling walker (2 wheels)  Activity Tolerance: Patient tolerated treatment well Patient left: in bed;with call bell/phone within reach  OT Visit Diagnosis: Unsteadiness on feet (R26.81);Other abnormalities of gait and mobility  (R26.89);Muscle weakness (generalized) (M62.81);History of falling (Z91.81);Low vision, both eyes (H54.2)                Time: 1368-5992 OT Time Calculation (min): 19 min Charges:  OT General Charges $OT Visit: 1 Visit OT Evaluation $OT Eval Moderate Complexity: 1 Mod  Arilla Hice OT, MOT  Larey Seat 06/14/2021, 9:14 AM

## 2021-06-14 NOTE — Assessment & Plan Note (Addendum)
b12 and folate normal No signs of bleeding FOBT negative Hemoglobin improved after 1 unit PRBC on 2/17 and remained stable after transfusion

## 2021-06-14 NOTE — Progress Notes (Signed)
Patient declined CPAP. Is noncompliant with it at home. Stated he doesn't know why is he in the hospital because he doesn't feel sick or like anything is wrong with him.

## 2021-06-15 DIAGNOSIS — R601 Generalized edema: Secondary | ICD-10-CM | POA: Diagnosis present

## 2021-06-15 LAB — COMPREHENSIVE METABOLIC PANEL
ALT: 31 U/L (ref 0–44)
AST: 25 U/L (ref 15–41)
Albumin: 2.8 g/dL — ABNORMAL LOW (ref 3.5–5.0)
Alkaline Phosphatase: 480 U/L — ABNORMAL HIGH (ref 38–126)
Anion gap: 10 (ref 5–15)
BUN: 19 mg/dL (ref 8–23)
CO2: 22 mmol/L (ref 22–32)
Calcium: 7.9 mg/dL — ABNORMAL LOW (ref 8.9–10.3)
Chloride: 100 mmol/L (ref 98–111)
Creatinine, Ser: 1.68 mg/dL — ABNORMAL HIGH (ref 0.61–1.24)
GFR, Estimated: 44 mL/min — ABNORMAL LOW (ref >60)
Glucose, Bld: 91 mg/dL (ref 70–99)
Potassium: 4.2 mmol/L (ref 3.5–5.1)
Sodium: 132 mmol/L — ABNORMAL LOW (ref 135–145)
Total Bilirubin: 0.4 mg/dL (ref 0.3–1.2)
Total Protein: 5.6 g/dL — ABNORMAL LOW (ref 6.5–8.1)

## 2021-06-15 LAB — GLUCOSE, CAPILLARY
Glucose-Capillary: 103 mg/dL — ABNORMAL HIGH (ref 70–99)
Glucose-Capillary: 106 mg/dL — ABNORMAL HIGH (ref 70–99)
Glucose-Capillary: 111 mg/dL — ABNORMAL HIGH (ref 70–99)
Glucose-Capillary: 114 mg/dL — ABNORMAL HIGH (ref 70–99)

## 2021-06-15 LAB — CBC
HCT: 25.8 % — ABNORMAL LOW (ref 39.0–52.0)
Hemoglobin: 7.6 g/dL — ABNORMAL LOW (ref 13.0–17.0)
MCH: 30.3 pg (ref 26.0–34.0)
MCHC: 29.5 g/dL — ABNORMAL LOW (ref 30.0–36.0)
MCV: 102.8 fL — ABNORMAL HIGH (ref 80.0–100.0)
Platelets: 178 10*3/uL (ref 150–400)
RBC: 2.51 MIL/uL — ABNORMAL LOW (ref 4.22–5.81)
RDW: 16.9 % — ABNORMAL HIGH (ref 11.5–15.5)
WBC: 4.3 10*3/uL (ref 4.0–10.5)
nRBC: 0 % (ref 0.0–0.2)

## 2021-06-15 LAB — PREPARE RBC (CROSSMATCH)

## 2021-06-15 LAB — OCCULT BLOOD X 1 CARD TO LAB, STOOL: Fecal Occult Bld: NEGATIVE

## 2021-06-15 LAB — T3: T3, Total: 138 ng/dL (ref 71–180)

## 2021-06-15 LAB — MAGNESIUM: Magnesium: 2.3 mg/dL (ref 1.7–2.4)

## 2021-06-15 LAB — ABO/RH: ABO/RH(D): O POS

## 2021-06-15 MED ORDER — QUETIAPINE FUMARATE 25 MG PO TABS
50.0000 mg | ORAL_TABLET | Freq: Every day | ORAL | Status: DC
  Administered 2021-06-15: 50 mg via ORAL
  Filled 2021-06-15: qty 2

## 2021-06-15 MED ORDER — SODIUM CHLORIDE 0.9% IV SOLUTION: Freq: Once | INTRAVENOUS | Status: AC

## 2021-06-15 MED ORDER — FUROSEMIDE 10 MG/ML IJ SOLN
20.0000 mg | Freq: Once | INTRAMUSCULAR | Status: AC
  Administered 2021-06-15: 20 mg via INTRAVENOUS
  Filled 2021-06-15: qty 2

## 2021-06-15 NOTE — Assessment & Plan Note (Addendum)
Likely related to hypoalbuminemia He is noted to have some improvement after a dose of Lasix on 2/17

## 2021-06-15 NOTE — Progress Notes (Addendum)
Progress Note   Patient: Edwin Snyder EQA:[id number] DOB: 01/08/1956 DOA: 06/12/2021     2 DOS: the patient was seen and examined on 06/15/2021   Brief hospital course: 66 y/o male who was recently treated for UTI and discharged to SNF, returns to hospital with hallucinations and altered mental status. Patient was adequately treated for UTI. He was noted to have very low thiamine level on last admission and is currently on high dose thiamine replacement. CT head negative. No other signs of infection at present. Neurology following. He was also noted to be incidentally positive for covid  Assessment and Plan: * Acute metabolic encephalopathy- (present on admission) Please see management in delirium  Anasarca Likely related to hypoalbuminemia Will give a dose of lasix today  Stage 3a chronic kidney disease (CKD) ([place])- (present on admission) Baseline creatinine approximately 1.3 Currently creatinine trended up to 1.6 Continue to follow  Macrocytic anemia- (present on admission) b12 and folate normal No signs of bleeding FOBT negative Currently, hemoglobin 7.6 and has been trending down He has been having episodes of hypotension, weakness and uptrend in creatinine Will transfuse 1 unit prbc  Elevated TSH- (present on admission) TSH elevated at 7 Free T4 normal Suspect sick euthyroid Will need repeat thyroid studies in 4 weeks  Chronic pain syndrome- (present on admission) Reports that he has been taking suboxone for last 2 years with good control of chronic pain (chest pain, abd pain and shoulder pain) This is prescribed by pain management clinic in [place] [facility])  COVID-19 virus infection- (present on admission) Appears to be asymptomatic Started on paxlovid Inflammatory markers minimally elevated On room air  Delirium- (present on admission) CT head was negative Seen by neurology Further neuroimaging felt to be low yield Felt mental status changes may be  related to recent UTI, may also have component of wernicke encephalopathy Started on high dose thiamine  Overall mental status appears to be improving  Chest pain- (present on admission) Reported chest pain earlier today EKG and troponins negative Feels that pain is chronic pain that has improved with suboxone in the past Would probably benefit from outpatient cardiology evaluation since he does have some CAD on imaging  Obesity, Class III, BMI 40-49.9 (morbid obesity) ([place])- (present on admission) Needs lifestyle changes  UTI (urinary tract infection)- (present on admission) Recently completed course of fosfomycin for enterococcal UTI. Repeat UA on admission shows improvement of pyuria  Hyperlipidemia- (present on admission) Continue on statin  GERD (gastroesophageal reflux disease)- (present on admission) Continue PPI  Essential hypertension- (present on admission) Recently started on midodrine for low blood pressure Continue to monitor   Generalized weakness Seen by PT with recommendations for continued SNF Patient expresses desire to go home He reports that his niece will be moving in with him to help care for him  Hypomagnesemia Repalce.      Subjective: denies any complaints today. Notes indicate that he had some confusion overnight. Appears to be speaking appropriately at present  Physical Exam: Vitals:   06/14/21 2130 06/15/21 0553 06/15/21 0850 06/15/21 1411  BP: (!) 153/95 (!) 146/74  (!) 142/76  Pulse: 80 88  88  Resp: 19 19  18   Temp: 97.7 F (36.5 C) 98.4 F (36.9 C)  98.6 F (37 C)  TempSrc:    Oral  SpO2: 96% 98% 96% 98%  Weight:      Height:       General exam: Alert, awake, oriented x 3 Respiratory system: Clear to  auscultation. Respiratory effort normal. Cardiovascular system:RRR. No murmurs, rubs, gallops. Gastrointestinal system: Abdomen is nondistended, soft and nontender. No organomegaly or masses felt. Normal bowel sounds  heard. Central nervous system: Alert and oriented. No focal neurological deficits. Extremities: anasarca is present Skin: No rashes, lesions or ulcers Psychiatry: Judgement and insight appear normal. Mood & affect appropriate.    Data Reviewed:  Reviewed cbc and chemistry panel. Repeat cbc and chem panel ordered for AM  Family Communication: discussed with patient  Disposition: Status is: Inpatient  Remains inpatient appropriate because: prbc transfusion and lasix today    Planned Discharge Destination: Home with Home Health     Time spent: 35 minutes  Author: Kathie Dike, MD 06/15/2021 7:48 PM  For on call review [email or web address].

## 2021-06-15 NOTE — Progress Notes (Signed)
Patient found to be trying to get up without assistance again. Had used his bed sheets to wipe himself. Asking to use the phone to call his niece so she could bring him clothes to leave. Assisted patient with getting cleaned up with the help of CNA Jen. Explained to patient that he needed to call us when he needed help, using his call bell. Stated he knows that. Explained that patient needed to not get up on his own. Said "that's not what the doctor told me, he said I need to be up moving." Explained to patient he is not physically able to do that by himself and needs assistance, and we will help him. This is third time we have had conversation today. Got him cleaned up. Stated "I didn't know I had gone to the bathroom." Then said he had to urinate. Explained to patient again that he could go ahead and void his bladder. Patient had picked three new spots to his arms, placed mepilex on the sites to cover. Patient already had bilat arm wraps. Patient had a bottle of alcohol on his bedside table. Asked patient where he got the bottle. Stated that it was used to clean his arms last night. Placed on shelf. Patient requested to have it back on his table. Stated that I could not leave that on his bedside table.

## 2021-06-15 NOTE — Progress Notes (Signed)
Patient A&O to self and place, disoriented to time and situation. Pt believes he is leaving today because per his Dr. Marland Kitchenhe can walk a lot better". I attempted to reorient pt that he cannot walk very well at all and is totally dependent on staff for bathing and toileting. Pt did not agree. Pt has made multiple attempts to get out of bed by himself this shift.

## 2021-06-15 NOTE — Progress Notes (Signed)
Patient worked with physical therapy to stand at bedside. Patient remains forgetful, trying to get up on his own, but has a hard time with moving his legs, forgets that he has a urinary external cath in which to void. Requires frequent reorientation interventions.

## 2021-06-15 NOTE — TOC Progression Note (Signed)
Transition of Care [facility]) - Progression Note    Patient Details  Name: Edwin Snyder MRN: [id number] Date of Birth: 09-25-1955  Transition of Care [facility]) CM/SW Contact  Ihor Gully, LCSW Phone Number: 06/15/2021, 1:09 PM  Clinical Narrative:    Patient indicates that he wants to go home. States that his niece is at bedside currently and he has arranged for family, neighbors, and friends to assist him. Discussed that no [place] was willing to take him and he states he did not need [place] services.     Expected Discharge Plan: [place] Barriers to Discharge: Continued Medical Work up  Expected Discharge Plan and Services Expected Discharge Plan: [place] Choice: [place] arrangements for the past 2 months: [place]                                       Social Determinants of Health (SDOH) Interventions    Readmission Risk Interventions Readmission Risk Prevention Plan 06/07/2021  Transportation Screening Complete  PCP or Specialist Appt within 5-7 Days Complete  Home Care Screening Complete  Medication Review (RN CM) Complete  Some recent data might be hidden

## 2021-06-15 NOTE — Progress Notes (Signed)
Patient is alert but disoriented to situation. Resp in to see patient at this time. Vitals stable. Awaiting breakfast.

## 2021-06-15 NOTE — Progress Notes (Signed)
Physical Therapy Treatment Patient Details Name: Edwin Snyder MRN: [id number] DOB: 12/20/1955 Today's Date: 06/15/2021   History of Present Illness Edwin Snyder is a 66 y.o. 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  Patient was recently discharged on 2/9 after being treated for generalized weakness, septic shock from urinary tract infection complicated by transaminitis.  He was eventually discharged home after being treated with fosfomycin for Enterococcus.  Patient states he was doing okay but this morning he rolled off the bed and had a nontraumatic fall.  Per records it indicates that [name] had reported patient has been slightly disorientated hallucinating for the past 2 days.  In the ED patient was routineBlood work showed transaminitis but overall improving from previous admission, macrocytic anemia, normal ammonia level, negative UA.  CT of the head and cervical spine was negative.  Right upper quadrant ultrasound overall was unremarkable for any acute pathology.  Chest x-ray showed hiatal hernia.     When I saw the patient he was alert awake oriented X4.  He was not having active hallucinations during my visit.    PT Comments    Patient demonstrates fair/good return for sitting up at bedside with use of bed rail and HOB slightly raised, fair/good return for completing BLE ROM/strengthening exercises while seated at bedside with verbal cues, but requires frequent rest breaks due to c/o left knee soreness and fatigue.  Patient required multiple attempts before able to come to complete stand, tolerated standing for up to 2 minutes before having to sit due to fatigue and BLE weakness and unable to transfer to chair.  Patient demonstrates good return for using BUE/LE to reposition self when put back to bed.  Patient will  benefit from continued skilled physical therapy in hospital and recommended venue below to increase strength, balance, endurance for safe ADLs and gait.     Recommendations for follow up therapy are one component of a multi-disciplinary discharge planning process, led by the attending physician.  Recommendations may be updated based on patient status, additional functional criteria and insurance authorization.  Follow Up Recommendations  Skilled nursing-short term rehab (<3 hours/day)     Assistance Recommended at Discharge Intermittent Supervision/Assistance  Patient can return home with the following A lot of help with bathing/dressing/bathroom;A lot of help with walking and/or transfers;Two people to help with walking and/or transfers;Help with stairs or ramp for entrance;Assistance with cooking/housework   Equipment Recommendations  None recommended by PT    Recommendations for Other Services       Precautions / Restrictions Precautions Precautions: Fall Restrictions Weight Bearing Restrictions: No     Mobility  Bed Mobility Overal bed mobility: Needs Assistance Bed Mobility: Supine to Sit, Sit to Supine     Supine to sit: Min assist, HOB elevated Sit to supine: Mod assist, Max assist   General bed mobility comments: requires Mod/max assist to move BLE when getting back into bed    Transfers Overall transfer level: Needs assistance Equipment used: Rolling walker (2 wheels) Transfers: Sit to/from Stand Sit to Stand: Max assist           General transfer comment: required 3 attempts before able to complete sit to stand    Ambulation/Gait                   Stairs  Wheelchair Mobility    Modified Rankin (Stroke Patients Only)       Balance Overall balance assessment: Needs assistance Sitting-balance support: Feet supported, No upper extremity supported Sitting balance-Leahy Scale: Fair Sitting balance - Comments: fair/good  seated at EOB   Standing balance support: Reliant on assistive device for balance, During functional activity, Bilateral upper extremity supported Standing balance-Leahy Scale: Poor Standing balance comment: using RW                            Cognition Arousal/Alertness: Awake/alert Behavior During Therapy: WFL for tasks assessed/performed Overall Cognitive Status: Within Functional Limits for tasks assessed                                          Exercises General Exercises - Lower Extremity Long Arc Quad: Seated, AROM, Both, 10 reps, Strengthening Hip Flexion/Marching: Seated, AROM, Strengthening, Both, 10 reps Toe Raises: Seated, AROM, Strengthening, Both, 15 reps Heel Raises: Seated, AROM, Both, Strengthening, 15 reps    General Comments        Pertinent Vitals/Pain Pain Assessment Pain Assessment: Faces Faces Pain Scale: Hurts little more Pain Location: left knee Pain Descriptors / Indicators: Grimacing, Sore Pain Intervention(s): Limited activity within patient's tolerance, Monitored during session, Repositioned    Home Living                          Prior Function            PT Goals (current goals can now be found in the care plan section) Acute Rehab PT Goals Patient Stated Goal: return home with family and neighbor to assist PT Goal Formulation: With patient Time For Goal Achievement: 06/27/21 Potential to Achieve Goals: Good Progress towards PT goals: Progressing toward goals    Frequency    Min 3X/week      PT Plan Current plan remains appropriate    Co-evaluation              AM-PAC PT "6 Clicks" Mobility   Outcome Measure  Help needed turning from your back to your side while in a flat bed without using bedrails?: A Little Help needed moving from lying on your back to sitting on the side of a flat bed without using bedrails?: A Little Help needed moving to and from a bed to a chair (including  a wheelchair)?: Total Help needed standing up from a chair using your arms (e.g., wheelchair or bedside chair)?: A Lot Help needed to walk in hospital room?: Total Help needed climbing 3-5 steps with a railing? : Total 6 Click Score: 11    End of Session   Activity Tolerance: Patient tolerated treatment well;Patient limited by fatigue Patient left: in bed;with call bell/phone within reach Nurse Communication: Mobility status PT Visit Diagnosis: Unsteadiness on feet (R26.81);Other abnormalities of gait and mobility (R26.89);Muscle weakness (generalized) (M62.81)     Time: 5465-0354 PT Time Calculation (min) (ACUTE ONLY): 30 min  Charges:  $Therapeutic Exercise: 8-22 mins $Therapeutic Activity: 8-22 mins                     12:35 PM, 06/15/21 Lonell Grandchild, MPT Physical Therapist with [facility] [phone] office [phone] mobile phone

## 2021-06-16 ENCOUNTER — Inpatient Hospital Stay (HOSPITAL_COMMUNITY): Payer: Medicare Other

## 2021-06-16 LAB — CBC
HCT: 30.5 % — ABNORMAL LOW (ref 39.0–52.0)
Hemoglobin: 9.2 g/dL — ABNORMAL LOW (ref 13.0–17.0)
MCH: 31 pg (ref 26.0–34.0)
MCHC: 30.2 g/dL (ref 30.0–36.0)
MCV: 102.7 fL — ABNORMAL HIGH (ref 80.0–100.0)
Platelets: 173 10*3/uL (ref 150–400)
RBC: 2.97 MIL/uL — ABNORMAL LOW (ref 4.22–5.81)
RDW: 18.9 % — ABNORMAL HIGH (ref 11.5–15.5)
WBC: 4.8 10*3/uL (ref 4.0–10.5)
nRBC: 0 % (ref 0.0–0.2)

## 2021-06-16 LAB — URINALYSIS, ROUTINE W REFLEX MICROSCOPIC
Bacteria, UA: NONE SEEN
Bilirubin Urine: NEGATIVE
Glucose, UA: NEGATIVE mg/dL
Ketones, ur: NEGATIVE mg/dL
Leukocytes,Ua: NEGATIVE
Nitrite: NEGATIVE
Protein, ur: NEGATIVE mg/dL
Specific Gravity, Urine: 1.012 (ref 1.005–1.030)
pH: 5 (ref 5.0–8.0)

## 2021-06-16 LAB — GLUCOSE, CAPILLARY
Glucose-Capillary: 101 mg/dL — ABNORMAL HIGH (ref 70–99)
Glucose-Capillary: 95 mg/dL (ref 70–99)
Glucose-Capillary: 91 mg/dL (ref 70–99)
Glucose-Capillary: 114 mg/dL — ABNORMAL HIGH (ref 70–99)

## 2021-06-16 LAB — BLOOD GAS, ARTERIAL
Acid-Base Excess: 4 mmol/L — ABNORMAL HIGH (ref 0.0–2.0)
Bicarbonate: 29.7 mmol/L — ABNORMAL HIGH (ref 20.0–28.0)
Drawn by: 27016
FIO2: 28 %
O2 Saturation: 99.4 %
Patient temperature: 37
pCO2 arterial: 48 mmHg (ref 32–48)
pH, Arterial: 7.4 (ref 7.35–7.45)
pO2, Arterial: 113 mmHg — ABNORMAL HIGH (ref 83–108)

## 2021-06-16 LAB — COMPREHENSIVE METABOLIC PANEL
ALT: 26 U/L (ref 0–44)
AST: 19 U/L (ref 15–41)
Albumin: 2.8 g/dL — ABNORMAL LOW (ref 3.5–5.0)
Alkaline Phosphatase: 454 U/L — ABNORMAL HIGH (ref 38–126)
Anion gap: 10 (ref 5–15)
BUN: 16 mg/dL (ref 8–23)
CO2: 23 mmol/L (ref 22–32)
Calcium: 8 mg/dL — ABNORMAL LOW (ref 8.9–10.3)
Chloride: 100 mmol/L (ref 98–111)
Creatinine, Ser: 1.38 mg/dL — ABNORMAL HIGH (ref 0.61–1.24)
GFR, Estimated: 56 mL/min — ABNORMAL LOW (ref >60)
Glucose, Bld: 108 mg/dL — ABNORMAL HIGH (ref 70–99)
Potassium: 4.2 mmol/L (ref 3.5–5.1)
Sodium: 133 mmol/L — ABNORMAL LOW (ref 135–145)
Total Bilirubin: 0.7 mg/dL (ref 0.3–1.2)
Total Protein: 5.9 g/dL — ABNORMAL LOW (ref 6.5–8.1)

## 2021-06-16 LAB — MAGNESIUM: Magnesium: 1.9 mg/dL (ref 1.7–2.4)

## 2021-06-16 MED ORDER — LORAZEPAM 2 MG/ML IJ SOLN
1.0000 mg | INTRAMUSCULAR | Status: DC | PRN
  Filled 2021-06-16: qty 1

## 2021-06-16 MED ORDER — LORAZEPAM 1 MG PO TABS
1.0000 mg | ORAL_TABLET | ORAL | Status: DC | PRN
  Administered 2021-06-16 – 2021-06-18 (×5): 1 mg via ORAL
  Filled 2021-06-16 (×5): qty 1

## 2021-06-16 NOTE — Progress Notes (Signed)
patient found with no oxygen on. Patient has a significant decreased LOC. We are having to sternal rub him to get him to respond. vital signs are 123/87, hr 110, rr 14, o2 100 on 2L, 97.7 temp. patient will also not take medicines or eat breakfast. MD Memon notified.

## 2021-06-16 NOTE — Progress Notes (Signed)
Patient was moved from room 313 to room 341 so we could see him through a glass window. Attempted to call daughter to let her know with no answer.

## 2021-06-16 NOTE — Progress Notes (Signed)
CSW spoke with Jasmine at [facility] to request follow up occur with patient and his family regarding the CPAP machine in the home. Jasmine will send information to CPAP team and staff will follow up.  Madilyn Fireman, MSW, LCSW Transitions of Care   Clinical Social Worker II [phone]

## 2021-06-16 NOTE — Progress Notes (Signed)
Patient pulled IV out, took off male purwick, and heart monitor. Patient is refusing another IV. MD Memon made aware.

## 2021-06-16 NOTE — Progress Notes (Signed)
Patient is refusing medicines and not eating or drinking. MD Memon notified.

## 2021-06-16 NOTE — Progress Notes (Signed)
Progress Note   Patient: Edwin Snyder QIO:[id number] DOB: [birth year] DOA: 06/12/2021     3 DOS: the patient was seen and examined on 06/16/2021   Brief hospital course: 66 y/o male who was recently treated for UTI and discharged to SNF, returns to hospital with hallucinations and altered mental status. Patient was adequately treated for UTI. He was noted to have very low thiamine level on last admission and is currently on high dose thiamine replacement. CT head negative. No other signs of infection at present. Neurology following. He was also noted to be incidentally positive for covid  Assessment and Plan: * Acute metabolic encephalopathy- (present on admission) Please see management in delirium  Anasarca- (present on admission) Likely related to hypoalbuminemia He is noted to have some improvement after a dose of Lasix on 2/17  Stage 3a chronic kidney disease (CKD) ([place])- (present on admission) Baseline creatinine approximately 1.3 Currently creatinine is at baseline Continue to follow  Macrocytic anemia- (present on admission) b12 and folate normal No signs of bleeding FOBT negative Hemoglobin improved after 1 unit PRBC on 2/17  Elevated TSH- (present on admission) TSH elevated at 7 Free T4 normal Suspect sick euthyroid Will need repeat thyroid studies in 4 weeks  Chronic pain syndrome- (present on admission) Reports that he has been taking suboxone for last 2 years with good control of chronic pain (chest pain, abd pain and shoulder pain) This is prescribed by pain management clinic in [place] [facility])  COVID-19 virus infection- (present on admission) Appears to be asymptomatic Started on paxlovid Inflammatory markers minimally elevated On room air  Delirium- (present on admission) CT head was negative on admission Seen by neurology Further neuroimaging felt to be low yield Felt mental status changes may be related to recent UTI, may also have component of  wernicke encephalopathy since thiamine level was 38 on last admission He is completed a course of high-dose thiamine Continues to have a periods of confusion interspersed with periods of clarity He did receive a dose of Seroquel 2/17 in the evening since staff had been noticed he was increasingly agitated overnight On 2/18, he was noted to be increasingly lethargic, difficult to wake up. 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, since it is unclear how long he had been thiamine deficient Likely being in the hospital, recent infections have caused increased disorientation  Chest pain- (present on admission) Reported chest pain earlier in admission EKG and troponins negative Feels that pain is chronic pain that has improved with suboxone in the past Would probably benefit from outpatient cardiology evaluation since he does have some CAD on imaging  Obesity, Class III, BMI 40-49.9 (morbid obesity) ([place])- (present on admission) Needs lifestyle changes  UTI (urinary tract infection)- (present on admission) Recently completed course of fosfomycin for enterococcal UTI. Repeat UA on admission shows improvement of pyuria  Hyperlipidemia- (present on admission) Continue on statin  GERD (gastroesophageal reflux disease)- (present on admission) Continue PPI  Essential hypertension- (present on admission) Recently started on midodrine for low blood pressure Continue to monitor   Generalized weakness Seen by PT with recommendations for continued SNF Patient  expresses desire to go home Discussed with patient's daughter the patient  will need to be with someone 24/7 after returning home.  They are working on trying to figure out arrangements        Subjective: Patient is noted to be extremely lethargic, difficult to arouse this morning.  Briefly opens his eyes up to voice  Physical Exam: Vitals:   06/16/21 1052 06/16/21 1128 06/16/21 1356 06/16/21 1730  BP: 99/70 112/89 122/80 122/87  Pulse: (!) 102 (!) 101 (!) 107 97  Resp: 14 14 15 20   Temp:   99 F (37.2 C) 98.2 F (36.8 C)  TempSrc:   Oral Oral  SpO2: 100% 97% 92% 98%  Weight:      Height:       General exam: Somnolent, briefly wakes up to voice Respiratory system: Clear to auscultation. Respiratory effort normal. Cardiovascular system:RRR. No murmurs, rubs, gallops. Gastrointestinal system: Abdomen is nondistended, soft and nontender. No organomegaly or masses felt. Normal bowel sounds heard. Central nervous system: No focal neurological deficits. Extremities: Anasarca is present Skin: No rashes, lesions or ulcers Psychiatry: Somnolent   Data Reviewed:  Reviewed CT head, urinalysis, ABG, chemistry and CBC  Family Communication: Discussed with patient's daughter over the phone  Disposition: Status is: Inpatient  Remains inpatient appropriate because: Need to determine appropriate discharge plan    Planned Discharge Destination: Home     Time spent: 35 minutes  Author: Kathie Dike, MD 06/16/2021 9:05 PM  For on call review [email or web address].

## 2021-06-16 NOTE — Progress Notes (Signed)
0930: Pt remains drowsy, oriented to name, DOB and location. RT in to draw ordered ABG, portable chest xray obtained. Pt with congested cough, non-productive. Denies c/o pain 1005: Pt's VSS, remains drowsy, will respond to name called, oriented to name, DOB and location. Denies c/o pain.  1035: MD Memon in to reevaluate pt and speak with pt's family member. Orders placed. 1100: Pt taken via bed to CT for ordered scan.

## 2021-06-16 NOTE — Progress Notes (Signed)
Patient is not alert enough to take oral meds. MD Memon was notified.

## 2021-06-17 LAB — CBC
HCT: 31 % — ABNORMAL LOW (ref 39.0–52.0)
Hemoglobin: 9.2 g/dL — ABNORMAL LOW (ref 13.0–17.0)
MCH: 30.7 pg (ref 26.0–34.0)
MCHC: 29.7 g/dL — ABNORMAL LOW (ref 30.0–36.0)
MCV: 103.3 fL — ABNORMAL HIGH (ref 80.0–100.0)
Platelets: 180 10*3/uL (ref 150–400)
RBC: 3 MIL/uL — ABNORMAL LOW (ref 4.22–5.81)
RDW: 18.8 % — ABNORMAL HIGH (ref 11.5–15.5)
WBC: 5.1 10*3/uL (ref 4.0–10.5)
nRBC: 0 % (ref 0.0–0.2)

## 2021-06-17 LAB — GLUCOSE, CAPILLARY
Glucose-Capillary: 93 mg/dL (ref 70–99)
Glucose-Capillary: 113 mg/dL — ABNORMAL HIGH (ref 70–99)
Glucose-Capillary: 108 mg/dL — ABNORMAL HIGH (ref 70–99)
Glucose-Capillary: 114 mg/dL — ABNORMAL HIGH (ref 70–99)

## 2021-06-17 LAB — BPAM RBC
Blood Product Expiration Date: 202303242359
ISSUE DATE / TIME: 202302172322
Unit Type and Rh: 5100

## 2021-06-17 LAB — TYPE AND SCREEN
ABO/RH(D): O POS
Antibody Screen: NEGATIVE
Unit division: 0

## 2021-06-17 LAB — MAGNESIUM: Magnesium: 1.9 mg/dL (ref 1.7–2.4)

## 2021-06-17 NOTE — Progress Notes (Signed)
Progress Note   Patient: Edwin Snyder [id number]:[id number] DOB: 24-Mar-1956 DOA: 06/12/2021     4 DOS: the patient was seen and examined on 06/17/2021   Brief hospital course: 66 y/o male who was recently treated for UTI and discharged to SNF, returns to hospital with hallucinations and altered mental status. Patient was adequately treated for UTI. He was noted to have very low thiamine level on last admission and is currently on high dose thiamine replacement. CT head negative. No other signs of infection at present. Neurology following. He was also noted to be incidentally positive for covid  Assessment and Plan: * Acute metabolic encephalopathy- (present on admission) Please see management in delirium  Anasarca- (present on admission) Likely related to hypoalbuminemia He is noted to have some improvement after a dose of Lasix on 2/17  Stage 3a chronic kidney disease (CKD) ([place])- (present on admission) Baseline creatinine approximately 1.3 Currently creatinine is at baseline Continue to follow  Macrocytic anemia- (present on admission) b12 and folate normal No signs of bleeding FOBT negative Hemoglobin improved after 1 unit PRBC on 2/17  Elevated TSH- (present on admission) TSH elevated at 7 Free T4 normal Suspect sick euthyroid Will need repeat thyroid studies in 4 weeks  Chronic pain syndrome- (present on admission) Reports that he has been taking suboxone for last 2 years with good control of chronic pain (chest pain, abd pain and shoulder pain) This is prescribed by pain management clinic in [place] [facility])  COVID-19 virus infection- (present on admission) Appears to be asymptomatic Started on paxlovid Inflammatory markers minimally elevated On room air  Delirium- (present on admission) CT head was negative on admission Seen by neurology Further neuroimaging felt to be low yield Felt mental status changes may be related to recent UTI, may also have component of  wernicke encephalopathy since thiamine level was 38 on last admission He is completed a course of high-dose thiamine Continues to have a periods of confusion interspersed with periods of clarity He did receive a dose of Seroquel 2/17 in the evening since staff had been noticed he was increasingly agitated overnight On 2/18, he was noted to be increasingly lethargic, difficult to wake up. 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 for further recommendations  Chest pain- (present on admission) Reported chest pain earlier in admission EKG and troponins negative Feels that pain is chronic pain that has improved with suboxone in the past Would probably benefit from outpatient cardiology evaluation since he does have some CAD on imaging  Obesity, Class III, BMI 40-49.9 (morbid obesity) ([place])- (present on admission) Needs lifestyle changes  UTI (urinary tract infection)- (present on admission) Recently completed course of fosfomycin for enterococcal UTI. Repeat UA on admission shows improvement of pyuria  Hyperlipidemia- (present on admission) Continue on statin  GERD (gastroesophageal reflux disease)- (present on admission) Continue PPI  Essential hypertension- (present on admission) Recently started on midodrine for low blood pressure Continue to  monitor   Generalized weakness Seen by PT with recommendations for continued  SNF Patient expresses desire to go home Discussed with patient's daughter the patient will need to be with someone 24/7 after returning home.  They are working on trying to figure out arrangements        Subjective: Staff reports that patient was initially agitated this morning, but was more calm and pleasant through the day.  He is sleeping on my arrival, briefly opens his eyes.  Physical Exam: Vitals:   06/16/21 1730 06/16/21 2256 06/17/21 0408 06/17/21 1439  BP: 122/87 117/68 110/78 121/68  Pulse: 97 (!) 101 (!) 105 98  Resp: 20 19 20 18   Temp: 98.2 F (36.8 C) 97.7 F (36.5 C) 98.4 F (36.9 C) 98 F (36.7 C)  TempSrc: Oral   Oral  SpO2: 98% 96% 97% 95%  Weight:      Height:       General exam: Sleepy, does not appear in distress Respiratory system: Clear to auscultation. Respiratory effort normal. Cardiovascular system:RRR. No murmurs, rubs, gallops. Gastrointestinal system: Abdomen is nondistended, soft and nontender. No organomegaly or masses felt. Normal bowel sounds heard. Central nervous system:  No focal neurological deficits. Extremities: Bilateral lower extremity edema Skin: No rashes, lesions or ulcers Psychiatry: Somnolent   Data Reviewed:  Reviewed CBC from this a.m.  Family Communication: Discussed with patient's daughter over the phone  Disposition: Status is: Inpatient  Remains inpatient appropriate because: Further neurologic work-up and determining discharge disposition    Planned Discharge Destination: Home     Time spent: 35 minutes  Author: Kathie Dike, MD 06/17/2021 8:15 PM  For on call review [email or web address].

## 2021-06-17 NOTE — Progress Notes (Signed)
Did not attempt CPAP at this time do to confusion level.

## 2021-06-17 NOTE — Progress Notes (Addendum)
Patient refused CPAP for tonight. "Been doing ok with out." , he states.

## 2021-06-17 NOTE — Progress Notes (Addendum)
Patient pulled IV out. Patient refusing a new one. MD Zierle-Ghosh made aware.

## 2021-06-18 ENCOUNTER — Inpatient Hospital Stay (HOSPITAL_COMMUNITY)
Admit: 2021-06-18 | Discharge: 2021-06-18 | Disposition: A | Discharge status: Home / Self Care | Payer: Medicare Other | Attending: Internal Medicine | Admitting: Internal Medicine

## 2021-06-18 LAB — CBC
HCT: 27.5 % — ABNORMAL LOW (ref 39.0–52.0)
Hemoglobin: 8.4 g/dL — ABNORMAL LOW (ref 13.0–17.0)
MCH: 30.9 pg (ref 26.0–34.0)
MCHC: 30.5 g/dL (ref 30.0–36.0)
MCV: 101.1 fL — ABNORMAL HIGH (ref 80.0–100.0)
Platelets: 212 10*3/uL (ref 150–400)
RBC: 2.72 MIL/uL — ABNORMAL LOW (ref 4.22–5.81)
RDW: 18.4 % — ABNORMAL HIGH (ref 11.5–15.5)
WBC: 4.8 10*3/uL (ref 4.0–10.5)
nRBC: 0 % (ref 0.0–0.2)

## 2021-06-18 LAB — GLUCOSE, CAPILLARY
Glucose-Capillary: 94 mg/dL (ref 70–99)
Glucose-Capillary: 113 mg/dL — ABNORMAL HIGH (ref 70–99)
Glucose-Capillary: 117 mg/dL — ABNORMAL HIGH (ref 70–99)
Glucose-Capillary: 114 mg/dL — ABNORMAL HIGH (ref 70–99)

## 2021-06-18 LAB — MAGNESIUM: Magnesium: 1.5 mg/dL — ABNORMAL LOW (ref 1.7–2.4)

## 2021-06-18 MED ORDER — QUETIAPINE FUMARATE 25 MG PO TABS
12.5000 mg | ORAL_TABLET | Freq: Every day | ORAL | Status: DC
  Administered 2021-06-18 – 2021-06-20 (×3): 12.5 mg via ORAL
  Filled 2021-06-18 (×3): qty 1

## 2021-06-18 MED ORDER — MAGNESIUM OXIDE -MG SUPPLEMENT 400 (240 MG) MG PO TABS
400.0000 mg | ORAL_TABLET | Freq: Two times a day (BID) | ORAL | Status: DC
  Administered 2021-06-18 – 2021-06-20 (×4): 400 mg via ORAL
  Filled 2021-06-18 (×4): qty 1

## 2021-06-18 MED ORDER — THIAMINE HCL 100 MG PO TABS
100.0000 mg | ORAL_TABLET | Freq: Every day | ORAL | Status: DC
  Administered 2021-06-18 – 2021-06-20 (×3): 100 mg via ORAL
  Filled 2021-06-18 (×3): qty 1

## 2021-06-18 NOTE — Progress Notes (Signed)
Progress Note   Patient: Edwin Snyder XNA:[id number] DOB: 03/05/1956 DOA: 06/12/2021     5 DOS: the patient was seen and examined on 06/18/2021   Brief hospital course: 66 y/o male who was recently treated for UTI and discharged to SNF, returns to hospital with hallucinations and altered mental status. Patient was adequately treated for UTI. He was noted to have very low thiamine level on last admission and is currently on high dose thiamine replacement. CT head negative. No other signs of infection at present. Neurology following. He was also noted to be incidentally positive for covid  Assessment and Plan: * Acute metabolic encephalopathy- (present on admission) Please see management in delirium  Anasarca- (present on admission) Likely related to hypoalbuminemia He is noted to have some improvement after a dose of Lasix on 2/17  Stage 3a chronic kidney disease (CKD) ([place])- (present on admission) Baseline creatinine approximately 1.3 Currently creatinine is at baseline Continue to follow  Macrocytic anemia- (present on admission) b12 and folate normal No signs of bleeding FOBT negative Hemoglobin improved after 1 unit PRBC on 2/17  Elevated TSH- (present on admission) TSH elevated at 7 Free T4 normal Suspect sick euthyroid Will need repeat thyroid studies in 4 weeks  Chronic pain syndrome- (present on admission) Reports that he has been taking suboxone for last 2 years with good control of chronic pain (chest pain, abd pain and shoulder pain) This is prescribed by pain management clinic in [place] [facility]) Suboxone currently on hold due to confusion  COVID-19 virus infection- (present on admission) Appears to be asymptomatic Completed a course of Paxlovid Inflammatory markers minimally elevated On room air  Delirium- (present on admission) CT head was negative on admission Seen by neurology Further neuroimaging felt to be low yield Felt mental status changes  may be related to recent UTI, may also have component of wernicke encephalopathy since thiamine level was 38 on last admission He is completed a course of high-dose thiamine Continues to have a periods of confusion interspersed with periods of clarity He did receive a dose of Seroquel 2/17 in the evening since staff had been noticed he was increasingly agitated overnight On 2/18, he was noted to be increasingly lethargic, difficult to wake up. 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, since it is unclear how long he had been thiamine deficient Likely being in the hospital, recent infections have caused increased disorientation EEG done with report pending Appreciate neurology follow-up Avoiding further Ativan He will get a small dose of Seroquel tonight  Chest pain- (present on admission) Reported chest pain earlier in admission EKG and troponins negative Feels that pain is chronic pain that has improved with suboxone in the past Would probably benefit from outpatient cardiology evaluation since he does have some CAD on imaging  Obesity, Class III, BMI 40-49.9 (morbid obesity) ([place])- (present on admission) Needs lifestyle changes  UTI (urinary tract infection)- (present on admission) Recently completed course of fosfomycin for enterococcal UTI. Repeat UA on admission shows improvement of pyuria  Hyperlipidemia- (present on admission) Continue on statin  GERD (gastroesophageal reflux disease)- (present on admission) Continue PPI  Essential hypertension- (present on admission) Recently started on midodrine for low blood  pressure Continue to monitor   Generalized weakness Seen by PT with recommendations for continued SNF Patient expresses desire to go home Discussed with patient's daughter the patient will need to be with someone 24/7 after returning home.  They are working on trying to figure out arrangements        Subjective: Patient is lying in bed, remains confused.  Says he remembers speaking to neurology, but cannot tell me what was talked about.  Says he has to have a bowel movement at this time  Physical Exam: Vitals:   06/17/21 2019 06/18/21 0439 06/18/21 1223 06/18/21 2100  BP: 134/88 (!) 131/98 (!) 145/99 134/87  Pulse: 91 89 87 89  Resp: 17 17 17 17   Temp: (!) 97.5 F (36.4 C) 97.7 F (36.5 C) 98.7 F (37.1 C) 98.9 F (37.2 C)  TempSrc: Oral Oral  Oral  SpO2: 93% 93% 97% 99%  Weight:      Height:       General exam: Alert, awake, no distress Respiratory system: Clear to auscultation. Respiratory effort normal. Cardiovascular system:RRR. No murmurs, rubs, gallops. Gastrointestinal system: Abdomen is nondistended, soft and nontender. No organomegaly or masses felt. Normal bowel sounds heard. Central nervous system: No focal neurological deficits. Extremities: Bilateral lower extremity edema Skin: No rashes, lesions or ulcers Psychiatry: Appears confused at times.  Seems to have trouble recalling things.   Data Reviewed:  There are no new results to review at this time.  Family Communication: Updated patient's daughter, Glenard Haring over the phone  Disposition: Status is: Inpatient  Remains inpatient appropriate because: Further neuro work-up and determining discharge disposition    Planned Discharge Destination: Home     Time spent: 35 minutes  Author: Kathie Dike, MD 06/18/2021 9:51 PM  For on call review [email or web address].

## 2021-06-18 NOTE — Progress Notes (Addendum)
I connected with  Edwin Snyder on 06/18/21 by a video enabled telemedicine application and verified that I am speaking with the correct person using two identifiers.   I discussed the limitations of evaluation and management by telemedicine. The patient expressed understanding and agreed to proceed.  Location of patient: [facility] Location of physician: [facility]   Subjective: Patient continues to have episodes of agitation requiring IV Ativan.  I called and spoke with patient's niece who states patient has been confused, seeing people smoking outside his window even though he is on the third floor, trying to take his clothes off.  Per niece, patient used to live independently in his house up until about 4 months ago when he had some issues with this leg and was told to be on bedrest for about 6 weeks.  Since then he has not been able to walk and has been gradually getting more more confused.  Denies any history of diagnosis of dementia.  Patient states he is feeling fine and repeatedly requesting to go home   ROS: negative except above  Examination  Vital signs in last 24 hours: Temp:  [97.5 F (36.4 C)-98.7 F (37.1 C)] 98.7 F (37.1 C) (02/20 1223) Pulse Rate:  [87-98] 87 (02/20 1223) Resp:  [17-18] 17 (02/20 1223) BP: (121-145)/(68-99) 145/99 (02/20 1223) SpO2:  [93 %-97 %] 97 % (02/20 1223)  General: lying in bed, not in apparent distress CVS: pulse-normal rate and rhythm RS: Appears to be breathing comfortably Neuro: Awake, alert, oriented to place and person but not to time, no evidence of aphasia, able to perform simple arithmetic's but not able to tell me days of the week backwards, cranial nerves II to XII appear grossly intact, antigravity strength in bilateral upper extremities without drift, FTN intact bilaterally, antigravity strength in left lower extremity with immediate drift to bed, antigravity strength in right lower extremity without  drift.  Basic Metabolic Panel: Recent Labs  Lab 06/12/21 1430 06/13/21 0646 06/14/21 0424 06/15/21 0416 06/16/21 0625 06/17/21 0525 06/18/21 0549  NA 135 137 132* 132* 133*  --   --   K 4.3 4.6 4.6 4.2 4.2  --   --   CL 100 101 98 100 100  --   --   CO2 23 23 22 22 23   --   --   GLUCOSE 110* 98 93 91 108*  --   --   BUN 15 15 20 19 16   --   --   CREATININE 1.42* 1.37* 1.63* 1.68* 1.38*  --   --   CALCIUM 8.4* 8.5* 7.8* 7.9* 8.0*  --   --   MG 1.6* 1.7 1.5* 2.3 1.9 1.9 1.5*    CBC: Recent Labs  Lab 06/12/21 1430 06/13/21 0646 06/14/21 0424 06/15/21 0416 06/16/21 0625 06/17/21 0525 06/18/21 0549  WBC 3.5*   < > 4.2 4.3 4.8 5.1 4.8  NEUTROABS 2.3  --   --   --   --   --   --   HGB 8.9*   < > 7.6* 7.6* 9.2* 9.2* 8.4*  HCT 28.5*   < > 25.6* 25.8* 30.5* 31.0* 27.5*  MCV 103.6*   < > 103.6* 102.8* 102.7* 103.3* 101.1*  PLT 159   < > 179 178 173 180 212   < > = values in this interval not displayed.     Coagulation Studies: No results for input(s): LABPROT, INR in the last 72 hours.  Imaging CT  head without contrast 06/16/2021: 1. Stable and negative for age non contrast CT appearance of the brain; remote left parietal craniotomy. 2. Acute bilateral paranasal sinus inflammation.    ASSESSMENT AND PLAN: 66 year old male with altered mental status  Altered mental status Visual hallucinations Agitation Suspected delirium -Patient most likely has delirium as well as possible underlying dementia.  He is also getting intermittent doses of IV Ativan which is likely contributing to delirium  Recommendations -Discontinue IV Ativan to minimize frequent use.  Please call medicine physician if needed. -We will start patient on p.o. Seroquel 12.5 mg to be administered at 5 PM to minimize sundowning.  Of note, patient was given a dose of Seroquel 50 mg early during hospital stay which led to excessive lethargy.  I discussed the lower dose with patient's niece who is in agreement  with plan. -Patient received 2 doses of 500 mg IV thiamine.  Recommend maintenance thiamine 100 mg daily -EEG ordered and pending due to prior left parietal craniotomy -Unable to perform MRI due to patient habitus.  Also it is unlikely that patient would have been able to tolerate MRI without sedation.  Therefore we will hold off on any further brain imaging in the absence of focal neurological deficits -Other work-up for reversible causes of dementia including B12 and free T3, T4 are within normal limits -Recommend follow-up with neurology as an outpatient for dementia evaluation -Continue delirium precautions -Continue PT/OT -Plan discussed with patient's niece on phone and Dr. Roderic Palau  Thank you for allowing [place] to participate in the care of this patient.  Please call neurology for any further questions.   Zeb Comfort Epilepsy [facility] For questions after 5pm please refer to AMION to reach the Neurologist on call

## 2021-06-18 NOTE — Procedures (Signed)
Patient Name: Edwin Snyder  MRN: [id number]  Epilepsy Attending: Lora Havens  Referring Physician/Provider: Kathie Dike, MD Date: 06/18/2021 Duration: 22.25 mins  Patient history:  66 year old male with altered mental status.  EEG to evaluate for seizure.  Level of alertness: Awake  AEDs during EEG study: Ativan  Technical aspects: This EEG study was done with scalp electrodes positioned according to the 10-20 International system of electrode placement. Electrical activity was acquired at a sampling rate of 500Hz  and reviewed with a high frequency filter of 70Hz  and a low frequency filter of 1Hz . EEG data were recorded continuously and digitally stored.   Description: The posterior dominant rhythm consists of 7 Hz activity of moderate voltage (25-35 uV) seen predominantly in posterior head regions, symmetric and reactive to eye opening and eye closing. EEG showed continuous generalized 6 to 7 Hz theta slowing.  Hyperventilation and photic stimulation were not performed.     Of note, study was technically difficult due to significant movement artifact.  ABNORMALITY -Continuous slow, generalized - Background slow  IMPRESSION: This technically difficult study is suggestive of moderate diffuse encephalopathy, nonspecific etiology. No seizures or epileptiform discharges were seen throughout the recording.  Octa Uplinger Barbra Sarks

## 2021-06-18 NOTE — Progress Notes (Signed)
EEG complete - results pending 

## 2021-06-18 NOTE — Plan of Care (Signed)
°  Problem: Education: °Goal: Knowledge of General Education information will improve °Description: Including pain rating scale, medication(s)/side effects and non-pharmacologic comfort measures °Outcome: Not Progressing °  °Problem: Health Behavior/Discharge Planning: °Goal: Ability to manage health-related needs will improve °Outcome: Not Progressing °  °Problem: Clinical Measurements: °Goal: Ability to maintain clinical measurements within normal limits will improve °Outcome: Progressing °  °

## 2021-06-19 DIAGNOSIS — Z0189 Encounter for other specified special examinations: Secondary | ICD-10-CM

## 2021-06-19 LAB — URINE CULTURE: Culture: 80000 — AB

## 2021-06-19 LAB — GLUCOSE, CAPILLARY
Glucose-Capillary: 91 mg/dL (ref 70–99)
Glucose-Capillary: 143 mg/dL — ABNORMAL HIGH (ref 70–99)
Glucose-Capillary: 118 mg/dL — ABNORMAL HIGH (ref 70–99)
Glucose-Capillary: 114 mg/dL — ABNORMAL HIGH (ref 70–99)

## 2021-06-19 LAB — CREATININE, SERUM
Creatinine, Ser: 1.22 mg/dL (ref 0.61–1.24)
GFR, Estimated: >60 mL/min (ref >60)

## 2021-06-19 MED ORDER — OXYCODONE HCL 5 MG PO TABS
5.0000 mg | ORAL_TABLET | Freq: Once | ORAL | Status: AC
  Administered 2021-06-19: 5 mg via ORAL
  Filled 2021-06-19: qty 1

## 2021-06-19 NOTE — Progress Notes (Signed)
Pt has been yelling for most of the day and attempting to get out of bed. Family was bedside for a while and tried to calm him with no success. Redirected pt and explained to him that he was too weak to stand and hasn't walked for some time now and that his yelling was disturbing other pts. MD is aware and will check with neurology to see if pt can have something for anxiety. Will continue to monitor pt.

## 2021-06-19 NOTE — Progress Notes (Signed)
Pt still not wearing CPAP at night. RT and machine available if/when patient is in agreement with wearing one.

## 2021-06-19 NOTE — Progress Notes (Signed)
Physical Therapy Treatment Patient Details Name: Edwin Snyder MRN: [id number] DOB: Apr 14, 1956 Today's Date: 06/19/2021   History of Present Illness Edwin Snyder is a 66 y.o. 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  Patient was recently discharged on 2/9 after being treated for generalized weakness, septic shock from urinary tract infection complicated by transaminitis.  He was eventually discharged home after being treated with fosfomycin for Enterococcus.  Patient states he was doing okay but this morning he rolled off the bed and had a nontraumatic fall.  Per records it indicates that [name] had reported patient has been slightly disorientated hallucinating for the past 2 days.  In the ED patient was routineBlood work showed transaminitis but overall improving from previous admission, macrocytic anemia, normal ammonia level, negative UA.  CT of the head and cervical spine was negative.  Right upper quadrant ultrasound overall was unremarkable for any acute pathology.  Chest x-ray showed hiatal hernia.     When I saw the patient he was alert awake oriented X4.  He was not having active hallucinations during my visit.    PT Comments    Patient tolerated 3 trials of sit to stands at bedside, able to stand for approximately 20-30 seconds before having to sit due to knees buckling secondary to weakness.  Patient unable to take steps or transfer to chair due to weakness, demonstrates fair/good return for completing BLE ROM/strengthening exercises while seated at bedside and demonstrates good return for using BUE/LE to help reposition self when put back to bed.  Patient will benefit from continued skilled physical therapy in hospital and recommended venue below to increase strength, balance, endurance for safe ADLs and gait.      Recommendations for follow up therapy are one component of a multi-disciplinary discharge planning process, led by the attending physician.  Recommendations may be updated based on patient status, additional functional criteria and insurance authorization.  Follow Up Recommendations  Skilled nursing-short term rehab (<3 hours/day)     Assistance Recommended at Discharge Intermittent Supervision/Assistance  Patient can return home with the following A lot of help with bathing/dressing/bathroom;A lot of help with walking and/or transfers;Two people to help with walking and/or transfers;Help with stairs or ramp for entrance;Assistance with cooking/housework   Equipment Recommendations  None recommended by PT    Recommendations for Other Services       Precautions / Restrictions Precautions Precautions: Fall Restrictions Weight Bearing Restrictions: No     Mobility  Bed Mobility Overal bed mobility: Needs Assistance Bed Mobility: Supine to Sit, Sit to Supine     Supine to sit: Mod assist, HOB elevated, +2 for physical assistance Sit to supine: Mod assist, Max assist   General bed mobility comments: requires Mod/max assist to move BLE when getting back into bed    Transfers Overall transfer level: Needs assistance Equipment used: Rolling walker (2 wheels) Transfers: Sit to/from Stand Sit to Stand: Max assist           General transfer comment: labored movement with frequent buckling of knees    Ambulation/Gait                   Stairs             Wheelchair Mobility    Modified Rankin (Stroke Patients Only)       Balance  Overall balance assessment: Needs assistance Sitting-balance support: Feet supported, No upper extremity supported Sitting balance-Leahy Scale: Fair Sitting balance - Comments: fair/good seated at EOB   Standing balance support: Reliant on assistive device for balance, During functional activity, Bilateral upper extremity  supported Standing balance-Leahy Scale: Poor Standing balance comment: using RW                            Cognition Arousal/Alertness: Awake/alert Behavior During Therapy: WFL for tasks assessed/performed Overall Cognitive Status: Within Functional Limits for tasks assessed                                          Exercises General Exercises - Lower Extremity Long Arc Quad: Seated, AROM, Both, 10 reps, Strengthening Hip Flexion/Marching: Seated, AROM, Strengthening, Both, 10 reps Toe Raises: Seated, AROM, Strengthening, Both, 15 reps Heel Raises: Seated, AROM, Both, Strengthening, 15 reps    General Comments        Pertinent Vitals/Pain Pain Assessment Pain Assessment: No/denies pain    Home Living                          Prior Function            PT Goals (current goals can now be found in the care plan section) Acute Rehab PT Goals Patient Stated Goal: return home with family and neighbor to assist Time For Goal Achievement: 06/27/21 Potential to Achieve Goals: Good Progress towards PT goals: Progressing toward goals    Frequency    Min 3X/week      PT Plan Current plan remains appropriate    Co-evaluation PT/OT/SLP Co-Evaluation/Treatment: Yes Reason for Co-Treatment: Complexity of the patient's impairments (multi-system involvement);To address functional/ADL transfers PT goals addressed during session: Mobility/safety with mobility;Balance;Proper use of DME;Strengthening/ROM OT goals addressed during session: ADL's and self-care      AM-PAC PT "6 Clicks" Mobility   Outcome Measure  Help needed turning from your back to your side while in a flat bed without using bedrails?: A Little Help needed moving from lying on your back to sitting on the side of a flat bed without using bedrails?: A Little Help needed moving to and from a bed to a chair (including a wheelchair)?: Total Help needed standing up from a chair  using your arms (e.g., wheelchair or bedside chair)?: A Lot Help needed to walk in hospital room?: Total Help needed climbing 3-5 steps with a railing? : Total 6 Click Score: 11    End of Session Equipment Utilized During Treatment: Gait belt Activity Tolerance: Patient tolerated treatment well;Patient limited by fatigue Patient left: in bed;with call bell/phone within reach Nurse Communication: Mobility status PT Visit Diagnosis: Unsteadiness on feet (R26.81);Other abnormalities of gait and mobility (R26.89);Muscle weakness (generalized) (M62.81)     Time: 6629-4765 PT Time Calculation (min) (ACUTE ONLY): 21 min  Charges:  $Therapeutic Exercise: 8-22 mins $Therapeutic Activity: 8-22 mins                     1:48 PM, 06/19/21 Lonell Grandchild, MPT Physical Therapist with [facility] [phone] office [phone] mobile phone

## 2021-06-19 NOTE — TOC Progression Note (Signed)
Transition of Care [facility]) - Progression Note    Patient Details  Name: Edwin Snyder MRN: [id number] Date of Birth: 1955/06/19  Transition of [facility]) CM/SW Contact  Ihor Gully, LCSW Phone Number: 06/19/2021, 2:42 PM  Clinical Narrative:    Damaris Schooner with Glenard Haring, patient's daughter. Discussed d/c plan. Glenard Haring is going to speak with sister, Joellen Jersey, regarding d/c plan.  Patient is authorized however he must enter the facility by 06/20/21 @ 11:59 p.m.   Expected Discharge Plan: [place] Barriers to Discharge: Continued Medical Work up  Expected Discharge Plan and Services Expected Discharge Plan: [place] Choice: [place] arrangements for the past 2 months: [place]                                       Social Determinants of Health (SDOH) Interventions    Readmission Risk Interventions Readmission Risk Prevention Plan 06/07/2021  Transportation Screening Complete  PCP or Specialist Appt within 5-7 Days Complete  Home Care Screening Complete  Medication Review (RN CM) Complete  Some recent data might be hidden

## 2021-06-19 NOTE — Progress Notes (Signed)
Occupational Therapy Treatment Patient Details Name: Edwin Snyder MRN: [id number] DOB: [birth year] Today's Date: 06/19/2021   History of present illness Edwin Snyder is a 66 y.o. 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  Patient was recently discharged on 2/9 after being treated for generalized weakness, septic shock from urinary tract infection complicated by transaminitis.  He was eventually discharged home after being treated with fosfomycin for Enterococcus.  Patient states he was doing okay but this morning he rolled off the bed and had a nontraumatic fall.  Per records it indicates that [name] had reported patient has been slightly disorientated hallucinating for the past 2 days.  In the ED patient was routineBlood work showed transaminitis but overall improving from previous admission, macrocytic anemia, normal ammonia level, negative UA.  CT of the head and cervical spine was negative.  Right upper quadrant ultrasound overall was unremarkable for any acute pathology.  Chest x-ray showed hiatal hernia.     When I saw the patient he was alert awake oriented X4.  He was not having active hallucinations during my visit.   OT comments  Pt able to complete bed mobility with mod A and then complete B UE strengthening with noted decrease in achieving full range with increase in reps. Pt demonstrates deficits in endurance and strength. PT then entered session as standing was attempted with +2 assist. Pt able to stand with RW with mod to max A +2 but unable to remain standing long enough to thoroughly tolerate peri-care from this therapist. Pt left in bed with call bell within reach and bed alarm set. Pt will benefit from continued OT in the hospital and recommended venue below to increase strength, balance, and endurance for  safe ADL's.      Recommendations for follow up therapy are one component of a multi-disciplinary discharge planning process, led by the attending physician.  Recommendations may be updated based on patient status, additional functional criteria and insurance authorization.    Follow Up Recommendations  Skilled nursing-short term rehab (<3 hours/day)    Assistance Recommended at Discharge Intermittent Supervision/Assistance  Patient can return home with the following  Two people to help with walking and/or transfers;A lot of help with bathing/dressing/bathroom;Assistance with cooking/housework;Assist for transportation;Help with stairs or ramp for entrance   Equipment Recommendations  None recommended by OT    Recommendations for Other Services      Precautions / Restrictions Precautions Precautions: Fall Restrictions Weight Bearing Restrictions: No       Mobility Bed Mobility Overal bed mobility: Needs Assistance Bed Mobility: Supine to Sit, Sit to Supine     Supine to sit: Mod assist, HOB elevated, +2 for physical assistance Sit to supine: Mod assist, Max assist   General bed mobility comments: requires Mod/max assist to move BLE when getting back into bed    Transfers Overall transfer level: Needs assistance Equipment used: Rolling walker (2 wheels) Transfers: Sit to/from Stand Sit to Stand: Max assist           General transfer comment: able to complete 3 reps of stit to stand; one rep completed with pt trying to maintain standing while peri-care was completed, but pt felt weak and needed to sit after ~10 seconds.     Balance Overall balance assessment: Needs assistance Sitting-balance support: Feet supported, No upper extremity supported Sitting balance-Leahy  Scale: Fair Sitting balance - Comments: fair/good seated at EOB   Standing balance support: Reliant on assistive device for balance, During functional activity, Bilateral upper extremity  supported Standing balance-Leahy Scale: Poor Standing balance comment: using RW                           ADL either performed or assessed with clinical judgement   ADL Overall ADL's : Needs assistance/impaired                             Toileting- Clothing Manipulation and Hygiene: Maximal assistance;Sit to/from stand Toileting - Clothing Manipulation Details (indicate cue type and reason): Pt tolerated assist with peri care while standing but only for ~10 seconds before needing to sit.              Cognition Arousal/Alertness: Awake/alert Behavior During Therapy: WFL for tasks assessed/performed Overall Cognitive Status: Within Functional Limits for tasks assessed                                          Exercises Exercises: General Upper Extremity General Exercises - Upper Extremity Shoulder Flexion: AROM, 10 reps, Seated Shoulder ABduction: AROM, 10 reps, Seated, AAROM Shoulder Horizontal ABduction: AROM, 10 reps, Seated (x10 internal and external rotation and protraction as well.)    Shoulder Instructions       General Comments      Pertinent Vitals/ Pain       Pain Assessment Pain Assessment: No/denies pain                                                          Frequency  Min 2X/week        Progress Toward Goals  OT Goals(current goals can now be found in the care plan section)  Progress towards OT goals: Progressing toward goals  Acute Rehab OT Goals OT Goal Formulation: With patient Time For Goal Achievement: 06/20/21 Potential to Achieve Goals: Fair ADL Goals Pt Will Perform Grooming: standing;with mod assist Pt Will Perform Lower Body Bathing: with min assist;sitting/lateral leans;with adaptive equipment Pt Will Perform Upper Body Dressing: with modified independence;sitting Pt Will Perform Lower Body Dressing: with mod assist;with min assist;sitting/lateral leans;with  adaptive equipment Pt Will Transfer to Toilet: with mod assist;stand pivot transfer;bedside commode Pt/caregiver will Perform Home Exercise Program: Increased strength;Both right and left upper extremity;Independently  Plan Discharge plan remains appropriate    Co-evaluation    PT/OT/SLP Co-Evaluation/Treatment: Yes Reason for Co-Treatment: Complexity of the patient's impairments (multi-system involvement)   OT goals addressed during session: ADL's and self-care                        End of Session Equipment Utilized During Treatment: Rolling walker (2 wheels);Gait belt  OT Visit Diagnosis: Unsteadiness on feet (R26.81);Other abnormalities of gait and mobility (R26.89);Muscle weakness (generalized) (M62.81);History of falling (Z91.81);Low vision, both eyes (H54.2)   Activity Tolerance Patient tolerated treatment well   Patient Left in bed;with call bell/phone within reach   Nurse Communication          Time: 6812-7517 OT Time  Calculation (min): 27 min  Charges: OT General Charges $OT Visit: 1 Visit OT Treatments $Therapeutic Exercise: 8-22 mins  Huma Imhoff OT, MOT  Larey Seat 06/19/2021, 10:23 AM

## 2021-06-20 DIAGNOSIS — E7220 Disorder of urea cycle metabolism, unspecified: Secondary | ICD-10-CM

## 2021-06-20 LAB — BASIC METABOLIC PANEL
Anion gap: 10 (ref 5–15)
BUN: 21 mg/dL (ref 8–23)
CO2: 25 mmol/L (ref 22–32)
Calcium: 8.5 mg/dL — ABNORMAL LOW (ref 8.9–10.3)
Chloride: 100 mmol/L (ref 98–111)
Creatinine, Ser: 1.35 mg/dL — ABNORMAL HIGH (ref 0.61–1.24)
GFR, Estimated: 58 mL/min — ABNORMAL LOW (ref >60)
Glucose, Bld: 107 mg/dL — ABNORMAL HIGH (ref 70–99)
Potassium: 4.3 mmol/L (ref 3.5–5.1)
Sodium: 135 mmol/L (ref 135–145)

## 2021-06-20 LAB — GLUCOSE, CAPILLARY
Glucose-Capillary: 157 mg/dL — ABNORMAL HIGH (ref 70–99)
Glucose-Capillary: 114 mg/dL — ABNORMAL HIGH (ref 70–99)
Glucose-Capillary: 106 mg/dL — ABNORMAL HIGH (ref 70–99)

## 2021-06-20 LAB — MAGNESIUM: Magnesium: 1.5 mg/dL — ABNORMAL LOW (ref 1.7–2.4)

## 2021-06-20 LAB — AMMONIA: Ammonia: 61 umol/L — ABNORMAL HIGH (ref 9–35)

## 2021-06-20 MED ORDER — QUETIAPINE FUMARATE 25 MG PO TABS: 12.5000 mg | ORAL_TABLET | Freq: Every day | ORAL | Status: DC

## 2021-06-20 MED ORDER — MAGNESIUM OXIDE -MG SUPPLEMENT 400 (240 MG) MG PO TABS: 400.0000 mg | ORAL_TABLET | Freq: Two times a day (BID) | ORAL | Status: DC

## 2021-06-20 MED ORDER — THIAMINE HCL 100 MG PO TABS: 100.0000 mg | ORAL_TABLET | Freq: Every day | ORAL | Status: DC

## 2021-06-20 MED ORDER — MAGNESIUM SULFATE 2 GM/50ML IV SOLN
2.0000 g | Freq: Once | INTRAVENOUS | Status: DC
Start: 2021-06-20 — End: 2021-06-20
  Filled 2021-06-20: qty 50

## 2021-06-20 NOTE — Progress Notes (Addendum)
I connected with  Edwin Snyder on 06/20/21 by a video enabled telemedicine application and verified that I am speaking with the correct person using two identifiers.   I discussed the limitations of evaluation and management by telemedicine. The patient expressed understanding and agreed to proceed.   Location of patient: [facility] Location of physician: [facility]  Subjective: Per chart review, patient has had intermittent episodes of agitation and confusion overnight.  Patient tells me he is okay and just irritated because we are not letting him go home.  ROS: negative except above Examination  Vital signs in last 24 hours: Temp:  [97.6 F (36.4 C)-97.7 F (36.5 C)] 97.6 F (36.4 C) (02/22 5852) Pulse Rate:  [88-92] 92 (02/22 0613) Resp:  [18-20] 20 (02/22 7782) BP: (123-129)/(73-85) 123/73 (02/22 4235) SpO2:  [98 %-99 %] 99 % (02/22 0613)  General: lying in bed, not in apparent distress CVS: pulse-normal rate and rhythm RS: Appears to be breathing comfortably Neuro: Awake, alert, oriented to place and person but not to time, no evidence of aphasia, able to perform simple arithmetics  Basic Metabolic Panel: Recent Labs  Lab 06/14/21 0424 06/15/21 0416 06/16/21 0625 06/17/21 0525 06/18/21 0549 06/19/21 0510 06/20/21 0846  NA 132* 132* 133*  --   --   --  135  K 4.6 4.2 4.2  --   --   --  4.3  CL 98 100 100  --   --   --  100  CO2 22 22 23   --   --   --  25  GLUCOSE 93 91 108*  --   --   --  107*  BUN 20 19 16   --   --   --  21  CREATININE 1.63* 1.68* 1.38*  --   --  1.22 1.35*  CALCIUM 7.8* 7.9* 8.0*  --   --   --  8.5*  MG 1.5* 2.3 1.9 1.9 1.5*  --  1.5*    CBC: Recent Labs  Lab 06/14/21 0424 06/15/21 0416 06/16/21 0625 06/17/21 0525 06/18/21 0549  WBC 4.2 4.3 4.8 5.1 4.8  HGB 7.6* 7.6* 9.2* 9.2* 8.4*  HCT 25.6* 25.8* 30.5* 31.0* 27.5*  MCV 103.6* 102.8* 102.7* 103.3* 101.1*  PLT 179 178 173 180 212     Coagulation Studies: No  results for input(s): LABPROT, INR in the last 72 hours.  Imaging No new brain imaging overnight  = ASSESSMENT AND PLAN: 66 year old male with altered mental status   Altered mental status ( resolved) Visual hallucinations Agitation Delirium ( improving) -Patient most likely has delirium as well as possible underlying dementia.     Recommendations -Continue p.o. Seroquel 12.5 mg to be administered at 5 PM to minimize sundowning.   -Continue maintenance thiamine 100 mg daily -Recommend follow-up with neurology as an outpatient for dementia evaluation -Continue delirium precautions -Continue PT/OT -Plan discussed with patient's daughter Glenard Haring on phone and Dr. Carles Collet   Thank you for allowing [place] to participate in the care of this patient.  Please call neurology for any further questions.   I have spent a total of 37   minutes with the patient reviewing hospital notes,  test results, labs and examining the patient as well as establishing an assessment and plan that was discussed personally with the patient, daughter on phone and Dr. Carles Collet.  > 50% of time was spent in direct patient care.        [facility] For questions after 5pm please refer to AMION to reach the Neurologist on call

## 2021-06-20 NOTE — Assessment & Plan Note (Signed)
Ammonia 61 on 06/20/21 -no clinically significant as patient's mental status has improved overall

## 2021-06-20 NOTE — Progress Notes (Signed)
Pt discharged and taken out via stretcher by EMS staff.

## 2021-06-20 NOTE — Progress Notes (Signed)
Progress Note   Patient: Edwin Snyder WIO:[id number] DOB: 03/18/1956 DOA: 06/12/2021     7 DOS: the patient was seen and examined on 06/20/2021   Brief hospital course: 66 y/o male who was recently treated for UTI and discharged to SNF, returns to hospital with hallucinations and altered mental status. Patient was adequately treated for UTI. He was noted to have very low thiamine level on last admission and is currently on high dose thiamine replacement. CT head negative. No other signs of infection at present. Neurology following. He was also noted to be incidentally positive for covid  Assessment and Plan: * Acute metabolic encephalopathy- (present on admission) Please see management in delirium  Encounter for assessment of decision-making capacity Patient has been requesting to discharge home and does not wish to return to SNF. His family has concerns about his safety to return home and are concerned that he may lack decision making capacity.  Patient was directly interviewed in order to assess his abilities to make decisions regarding his discharge disposition.  We discussed his discharge options including going back to SNF vs. Going home and living on his own. Patient did report that he could potentially fall if he went home, but could not identify any other risks. He could not identify any risks and benefits in going to SNF. He simply repeated that he wants to go home.  He was aware that he is currently at [facility], but was not aware of the date. He demonstrated that he had significant memory deficits, that he was unaware of his medical problems, and could not tell me that he has covid. He did not remember working with physical therapy earlier today. He reports that the last time he walked was a few days ago, when by all reports from family,  it has been several months since he ambulated.   Patient has been disoriented most of the day and appears to have a continued encephalopathy. He  was unable to show he understood risk and benefits of his discharge options. In his current state, it appears that he lacks real insight into his medical problems and could not show that he appreciated how his decision would be impacted by his obvious physical limitations. Since he is currently unaware of his limitations, I feel that he lacks capacity to make an informed decision regarding the risks of returning home.   His capacity to make these decisions should certainly be revisited in the future if his mental status changes.  Anasarca- (present on admission) Likely related to hypoalbuminemia He is noted to have some improvement after a dose of Lasix on 2/17  Stage 3a chronic kidney disease (CKD) (HCC)- (present on admission) Baseline creatinine approximately 1.3 Currently creatinine is at baseline Continue to follow  Macrocytic anemia- (present on admission) b12 and folate normal No signs of bleeding FOBT negative Hemoglobin improved after 1 unit PRBC on 2/17  Elevated TSH- (present on admission) TSH elevated at 7 Free T4 normal Suspect sick euthyroid Will need repeat thyroid studies in 4 weeks  Chronic pain syndrome- (present on admission) Reports that he has been taking suboxone for last 2 years with good control of chronic pain (chest pain, abd pain and shoulder pain) This is prescribed by pain management clinic in [place] [facility]) Suboxone currently on hold due to confusion  COVID-19 virus infection- (present on admission) Appears to be asymptomatic Completed a course of Paxlovid Inflammatory markers minimally elevated On room air  Delirium- (present on admission) CT  head was negative on admission Seen by neurology Further neuroimaging felt to be low yield Felt mental status changes may be related to recent UTI, may also have component of wernicke encephalopathy since thiamine level was 38 on last admission He completed a course of high-dose thiamine Continues to  have a periods of confusion interspersed with periods of clarity  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, since it is unclear how long he had been thiamine deficient Likely being in the hospital, recent infections have caused increased disorientation EEG done without any signs of epileptiform discharges Appreciate neurology follow-up He has been started on low dose seroquel in evening to help manage sundowning -he will likely need outpatient neurology follow up in the next few weeks pending persistence of symptoms  Chest pain- (present on admission) Reported chest pain earlier in admission EKG and troponins negative Feels that pain is chronic pain that has improved with suboxone in the past Would probably benefit from outpatient cardiology evaluation since he does have some CAD on imaging  Obesity, Class III, BMI 40-49.9 (morbid obesity) ([place])- (present on admission) Needs lifestyle changes  UTI (urinary tract infection)- (present on admission) Recently completed course of fosfomycin for enterococcal UTI during last admission. Repeat UA on current admission shows improvement of pyuria  Hyperlipidemia- (present on admission) Continue on statin  GERD (gastroesophageal reflux disease)- (present on admission) Continue PPI  Essential hypertension- (present on admission) Recently started on midodrine for low blood pressure Continue to monitor   Generalized weakness Seen by PT with recommendations for continued SNF Discussed with daughter and it does not appear that family will have adequate resources to care for patient at this time and are requesting he returns to SNF        Subjective: patient is sleeping on my arrival, but he wakes up to voice. Complains of pain in his ankles. Does not remember working with physical therapy. Nursing reports he has been agitated most of the day. He could be heard yelling in the halls.   Physical Exam: Vitals:   06/18/21  1223 06/18/21 2100 06/19/21 0242 06/19/21 2119  BP: (!) 145/99 134/87 118/81 129/85  Pulse: 87 89 97 88  Resp: 17 17 17 18   Temp: 98.7 F (37.1 C) 98.9 F (37.2 C) 98.6 F (37 C) 97.7 F (36.5 C)  TempSrc:  Oral    SpO2: 97% 99% 97% 98%  Weight:      Height:       General exam: Alert, awake, no distress Respiratory system: Clear to auscultation. Respiratory effort normal. Cardiovascular system:RRR. No murmurs, rubs, gallops. Gastrointestinal system: Abdomen is nondistended, soft and nontender. No organomegaly or masses felt. Normal bowel sounds heard. Central nervous system: Alert and oriented. No focal neurological deficits. Extremities: b/l lower extremity edema, feet are chronically everted Skin: No rashes, lesions or ulcers Psychiatry: confused   Data Reviewed:  There are no new results to review at this time.  Family Communication: updated patient daughter Glenard Haring over the phone  Disposition: Status is: Inpatient  Remains inpatient appropriate because: determining discharge disposition and neuro work up    Planned Discharge Destination: Skilled nursing facility     Time spent: 45 minutes  Author: Kathie Dike, MD 06/20/2021 12:33 AM  For on call review [email or web address].

## 2021-06-20 NOTE — TOC Transition Note (Incomplete)
Transition of Care [facility]) - CM/SW Discharge Note   Patient Details  Name: Edwin Snyder MRN: [id number] Date of Birth: [birth year]  [facility] CM/SW Contact:  Ihor Gully, LCSW Phone Number: 06/20/2021, 4:08 PM   Clinical Narrative:    D/c clinicals sent to facility. Glenard Haring advised to bring patient's CPAP to facility. Patient has declined CPAP nightly while in hospital.     Final next level of care: [place] Barriers to Discharge: No Barriers Identified   Patient Goals and CMS Choice Patient states their goals for this hospitalization and ongoing recovery are:: patient wants to go home      Discharge Placement              Patient chooses bed at: Other - please specify in the comment section below: ([facility]) Patient to be transferred to facility by: EMS Name of family member notified: daughter, Glenard Haring Patient and family notified of of transfer: 06/20/21  Discharge Plan and Services     Post Acute Care Choice: Home Health                               Social Determinants of Health (SDOH) Interventions     Readmission Risk Interventions Readmission Risk Prevention Plan 06/07/2021  Transportation Screening Complete  PCP or Specialist Appt within 5-7 Days Complete  Home Care Screening Complete  Medication Review (RN CM) Complete  Some recent data might be hidden

## 2021-06-20 NOTE — Progress Notes (Signed)
[place] and gave incoming report of pt to [facility]. Report received and accepted. Pt is awaiting transport of EMS.

## 2021-06-20 NOTE — Discharge Summary (Signed)
Physician Discharge Summary   Patient: Edwin Snyder MRN: [id number] DOB: [birth year]  Admit date:     06/12/2021  Discharge date: 06/20/21  Discharge Physician: Shanon Brow Margart Zemanek   PCP: Hal Morales, DO   Recommendations at discharge:   Please follow up with primary care provider within 1-2 weeks  Please repeat BMP and CBC in one week  Please follow up on/with neurology 1 month after discharge    Hospital Course: 66 y/o male who was recently treated for UTI and discharged to SNF, returns to hospital with hallucinations and altered mental status. Patient was adequately treated for UTI. He was noted to have very low thiamine level on last admission and is currently on high dose thiamine replacement. CT head negative. No other signs of infection at present. Neurology following. He was also noted to be incidentally positive for covid.  Patient has not had any hypoxia or dyspnea.  Repeat UA did not show any pyuria.  Patient had extensive work up for reversible causes of delirium which was essentially unrevealing except prior low thiamine levels which have been repleted.  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   During the hospitalization, patient continued to have waxing and waning episodes of confusion which was in part due to hospital delirium.  He was felt not to have capacity to make any medical decisions.  Although patient was resistant to go back to SNF, his family states they were unable to care for him and his daughter agreed with SNF.    Assessment and Plan: * Acute metabolic encephalopathy- (present on admission) Please see management in delirium  Hyperammonemia ([place]) Ammonia 61 on 06/20/21 -no clinically significant as patient's mental status has improved overall  Encounter for assessment of decision-making capacity Patient has been requesting to discharge home and does not wish to return to SNF. His  family has concerns about his safety to return home and are concerned that he may lack decision making capacity.  Patient was directly interviewed in order to assess his abilities to make decisions regarding his discharge disposition.  We discussed his discharge options including going back to SNF vs. Going home and living on his own. Patient did report that he could potentially fall if he went home, but could not identify any other risks. He could not identify any risks and benefits in going to SNF. He simply repeated that he wants to go home.  He was aware that he is currently at [facility], but was not aware of the date. He demonstrated that he had significant memory deficits, that he was unaware of his medical problems, and could not tell me that he has covid. He did not remember working with physical therapy earlier today. He reports that the last time he walked was a few days ago, when by all reports from family,  it has been several months since he ambulated.   Patient has been disoriented most of the day and appears to have a continued encephalopathy. He was unable to show he understood risk and benefits of his discharge options. In his current state, it appears that he lacks real insight into his medical problems and could not show that he appreciated how his decision would be impacted by his obvious physical limitations. Since he is currently unaware of his limitations, I feel that he lacks capacity to make an informed decision regarding the risks of returning home.   His capacity to make these decisions should certainly  be revisited in the future if his mental status changes.  Anasarca- (present on admission) Likely related to hypoalbuminemia He is noted to have some improvement after a dose of Lasix on 2/17  Stage 3a chronic kidney disease (CKD) (HCC)- (present on admission) Baseline creatinine approximately 1.3 Currently creatinine is 1.38 at time of dc Continue to  follow  Macrocytic anemia- (present on admission) b12 and folate normal No signs of bleeding FOBT negative Hemoglobin improved after 1 unit PRBC on 2/17 and remained stable after transfusion  Elevated TSH- (present on admission) TSH elevated at 7 Free T4 normal Suspect sick euthyroid Will need repeat thyroid studies in 4 weeks  Chronic pain syndrome- (present on admission) Reports that he has been taking suboxone for last 2 years with good control of chronic pain (chest pain, abd pain and shoulder pain) This is prescribed by pain management clinic in [place] [facility]) Suboxone currently on hold due to confusion  COVID-19 virus infection- (present on admission) Appears to be asymptomatic Completed a course of Paxlovid Inflammatory markers minimally elevated On room air  Delirium- (present on admission) CT head was negative on admission Seen by neurology Further neuroimaging felt to be low yield Felt mental status changes may be related to prior UTI (last admit 2/4-2/9), may also have component of wernicke encephalopathy since thiamine level was 38 on last admission, low Na, COVID, dehydration and hypnotic meds Also patient was on Cipro in SNF prior to this admission which can cause delirium He completed a course of high-dose thiamine Continues to have a periods of confusion interspersed with periods of clarity  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, since it is unclear how long he had been thiamine deficient Likely being in the hospital, recent infections have caused increased disorientation EEG done without any signs of epileptiform discharges Appreciate neurology follow-up He has been started on low dose seroquel in evening to help manage sundowning -he will likely need outpatient neurology follow up in the next few weeks pending persistence of symptoms -at the time of d/c--he is "pleasantly confused"  Chest pain- (present on admission) Reported  chest pain earlier in admission EKG and troponins negative Feels that pain is chronic pain that has improved with suboxone in the past Would probably benefit from outpatient cardiology evaluation since he does have some CAD on imaging  Obesity, Class III, BMI 40-49.9 (morbid obesity) ([place])- (present on admission) Needs lifestyle changes  UTI (urinary tract infection)- (present on admission) Recently completed course of fosfomycin for enterococcal UTI during last admission. Repeat UA on current admission shows improvement of pyuria  Hyperlipidemia- (present on admission) Continue on statin  GERD (gastroesophageal reflux disease)- (present on admission) Continue PPI  Essential hypertension- (present on admission) Recently started on midodrine for low blood pressure Continue to monitor   Generalized weakness Seen by PT with recommendations for continued SNF Discussed with daughter and it does not appear that family will have adequate resources to care for patient at this time and are requesting he returns to SNF           Consultants: neurology Procedures performed: none  Disposition: Skilled nursing facility Diet recommendation:  Cardiac diet  DISCHARGE MEDICATION: Allergies as of 06/20/2021       Reactions   Clindamycin/lincomycin Anaphylaxis   Penicillins Swelling   DID THE REACTION INVOLVE: Swelling of the face/tongue/throat, SOB, or low BP? yes Sudden or severe rash/hives, skin peeling, or the inside of the mouth or nose? yes  Did it require medical treatment? yes When did it last happen? 1990 If all above answers are NO, may proceed with cephalosporin use.   Ivp Dye [iodinated Contrast Media] Other (See Comments)   Pt. States she was injected with IV dye for an Angio chest in June 2022 and legs began swelling the next day.   Pt. States swelling has not gone down since study 3 months ago.   Pt. Refuses all dye studies.          Medication List     STOP  taking these medications    Buprenorphine HCl-Naloxone HCl 8-2 MG Film   ciprofloxacin 500 MG tablet Commonly known as: CIPRO   lactobacillus acidophilus & bulgar chewable tablet   nystatin cream Commonly known as: MYCOSTATIN       TAKE these medications    albuterol 108 (90 Base) MCG/ACT inhaler Commonly known as: VENTOLIN HFA Inhale 2 puffs into the lungs every 4 (four) hours as needed for wheezing or shortness of breath.   guaiFENesin 600 MG 12 hr tablet Commonly known as: MUCINEX Take 600 mg by mouth 2 (two) times daily.   ipratropium-albuterol 0.5-2.5 (3) MG/3ML Soln Commonly known as: DUONEB Take 3 mLs by nebulization every 6 (six) hours as needed (SOB).   magnesium oxide 400 (240 Mg) MG tablet Commonly known as: MAG-OX Take 1 tablet (400 mg total) by mouth 2 (two) times daily.   midodrine 10 MG tablet Commonly known as: PROAMATINE Take 1 tablet (10 mg total) by mouth 3 (three) times daily with meals.   omeprazole 40 MG capsule Commonly known as: PRILOSEC Take 40 mg by mouth daily.   QUEtiapine 25 MG tablet Commonly known as: SEROQUEL Take 0.5 tablets (12.5 mg total) by mouth at bedtime.   senna-docusate 8.6-50 MG tablet Commonly known as: Senokot-S Take 1 tablet by mouth 2 (two) times daily.   simvastatin 40 MG tablet Commonly known as: ZOCOR Take 40 mg by mouth daily.   thiamine 100 MG tablet Take 1 tablet (100 mg total) by mouth daily. Start taking on: June 21, 2021         Discharge Exam: Danley Danker Weights   06/12/21 1246  Weight: (!) 155.2 kg   GENERAL:  A&O x 2, NAD, well developed, cooperative, follows commands HEENT: [place]/AT, No thrush, No icterus, No oral ulcers Neck:  No neck mass, No meningismus, soft, supple CV: RRR, no S3, no S4, no rub, no JVD Lungs:  fine bibasilar rales.  no wheeze, no rhonchi, good air movement Abd: soft/NT +BS, nondistended Ext: No edema, no lymphangitis, no cyanosis, no rashes Neuro:  CN II-XII intact,  strength 4/5 in RUE, RLE, strength 4/5 LUE, LLE; sensation intact bilateral; no dysmetria; babinski equivocal   Condition at discharge: stable  The results of significant diagnostics from this hospitalization (including imaging, microbiology, ancillary and laboratory) are listed below for reference.   Imaging Studies: DG Chest 2 View  Result Date: 06/12/2021 CLINICAL DATA:  Altered mental status EXAM: CHEST - 2 VIEW COMPARISON:  06/02/2021 FINDINGS: Retrocardiac opacity consistent with moderate to large hiatal hernia. Streaky atelectasis left base. No pleural effusion. Chronic changes at the right shoulder. IMPRESSION: No active cardiopulmonary disease. Large hiatal hernia with streaky atelectasis left base. Electronically Signed   By: Donavan Foil M.D.   On: 06/12/2021 19:34   CT HEAD WO CONTRAST (5MM)  Result Date: 06/16/2021 CLINICAL DATA:  66 year old male with unexplained altered mental status. EXAM: CT HEAD WITHOUT CONTRAST TECHNIQUE: Contiguous axial  images were obtained from the base of the skull through the vertex without intravenous contrast. RADIATION DOSE REDUCTION: This exam was performed according to the departmental dose-optimization program which includes automated exposure control, adjustment of the mA and/or kV according to patient size and/or use of iterative reconstruction technique. COMPARISON:  Brain MRI 05/06/2016. Head CT 06/12/2021. FINDINGS: Brain: Cerebral volume remains normal for age. No midline shift, ventriculomegaly, mass effect, evidence of mass lesion, intracranial hemorrhage or evidence of cortically based acute infarction. Gray-white matter differentiation is stable and within normal limits for age. Vascular: Calcified atherosclerosis at the skull base. No suspicious intracranial vascular hyperdensity. Skull: Chronic left parietal craniotomy. No acute osseous abnormality identified. Sinuses/Orbits: Paranasal sinus mucosal thickening and fluid levels not  significantly changed from 4 days ago. Tympanic cavities and mastoids remain clear. Other: No acute orbit or scalp soft tissue finding. 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 IMPRESSION: 1. Stable and negative for age non contrast CT appearance of the brain; remote left parietal craniotomy. 2. Acute bilateral paranasal sinus inflammation. Electronically Signed   By: Genevie Ann M.D.   On: 06/16/2021 11:52   CT Head Wo Contrast  Result Date: 06/12/2021 CLINICAL DATA:  Head trauma moderate to severe.  Fall 2 days ago EXAM: CT HEAD WITHOUT CONTRAST CT CERVICAL SPINE WITHOUT CONTRAST TECHNIQUE: Multidetector CT imaging of the head and cervical spine was performed following the standard protocol without intravenous contrast. Multiplanar CT image reconstructions of the cervical spine were also generated. RADIATION DOSE REDUCTION: This exam was performed according to the departmental dose-optimization program which includes automated exposure control, adjustment of the mA and/or kV according to patient size and/or use of iterative reconstruction technique. COMPARISON:  CT head 05/07/2021 FINDINGS: CT HEAD FINDINGS Brain: Mild atrophy.  Mild white matter hypodensity bilaterally Negative for acute infarct, hemorrhage, mass Vascular: Negative for hyperdense vessel Skull: Left parietal craniotomy. Craniotomy flap in good position with bony incorporation. No acute skeletal abnormality Sinuses/Orbits: Mucosal edema paranasal sinuses with air-fluid levels in the right maxillary sinus and sphenoid sinus. Mastoid clear bilaterally. Negative orbit Other: None CT CERVICAL SPINE FINDINGS Alignment: Mild retrolisthesis C2-3.  Mild anterolisthesis C3-4 Skull base and vertebrae: Negative for fracture. 5 x 10 mm cyst in the posterior dens likely degenerative. There is pannus posterior to the dens. Soft tissues and spinal canal: Negative for mass or soft tissue swelling Disc levels: Multilevel disc and facet degeneration.  Multilevel foraminal stenosis bilaterally due to spurring. Mild central canal stenosis at C6-7 due to spurring. Upper chest: Lung apices clear bilaterally Other: None IMPRESSION: 1. No acute intracranial abnormality.  Left parietal craniotomy 2. Cervical spondylosis.  Negative for cervical fracture Electronically Signed   By: Franchot Gallo M.D.   On: 06/12/2021 15:16   CT Head Wo Contrast  Result Date: 06/02/2021 CLINICAL DATA:  Bilateral weakness, falls. Bilateral leg pain and numbness. EXAM: CT HEAD WITHOUT CONTRAST TECHNIQUE: Contiguous axial images were obtained from the base of the skull through the vertex without intravenous contrast. RADIATION DOSE REDUCTION: This exam was performed according to the departmental dose-optimization program which includes automated exposure control, adjustment of the mA and/or kV according to patient size and/or use of iterative reconstruction technique. COMPARISON:  Head CT dated 10/25/2020 FINDINGS: Brain: Generalized parenchymal volume loss with commensurate dilatation of the ventricles and sulci. Mild chronic small vessel ischemic changes within the deep periventricular white matter regions bilaterally. No mass, hemorrhage, edema or other evidence of acute parenchymal abnormality. No extra-axial hemorrhage. Vascular: Chronic calcified  atherosclerotic changes of the large vessels at the skull base. No unexpected hyperdense vessel. Skull: No acute findings. Surgical changes of a previous LEFT frontoparietal craniotomy. Sinuses/Orbits: Mild mucosal thickening within the ethmoid air cells and maxillary sinuses. Periorbital and retro-orbital soft tissues are unremarkable. Other: None. IMPRESSION: 1. No acute findings. No intracranial mass, hemorrhage or edema. 2. Mild chronic small vessel ischemic changes in the deep periventricular white matter regions. 3. Surgical changes of a previous LEFT frontoparietal craniotomy. 4. Mild paranasal sinus disease. Electronically Signed    By: Franki Cabot M.D.   On: 06/02/2021 20:07   CT Angio Chest Pulmonary Embolism (PE) W or WO Contrast  Result Date: 06/07/2021 CLINICAL DATA:  66 year old male with history of shortness of breath and weakness. Evaluate for pulmonary embolism. EXAM: CT ANGIOGRAPHY CHEST WITH CONTRAST TECHNIQUE: Multidetector CT imaging of the chest was performed using the standard protocol during bolus administration of intravenous contrast. Multiplanar CT image reconstructions and MIPs were obtained to evaluate the vascular anatomy. RADIATION DOSE REDUCTION: This exam was performed according to the departmental dose-optimization program which includes automated exposure control, adjustment of the mA and/or kV according to patient size and/or use of iterative reconstruction technique. CONTRAST:  151mL OMNIPAQUE IOHEXOL 350 MG/ML SOLN COMPARISON:  Chest CT 01/09/2021. FINDINGS: Cardiovascular: Today's study is limited by considerable patient respiratory motion. With these limitations in mind, there is no central, lobar or proximal segmental sized filling defect to suggest pulmonary embolism. Distal segmental and subsegmental sized emboli can not be entirely excluded on the basis of today's examination. Heart size is normal. There is no significant pericardial fluid, thickening or pericardial calcification. There is aortic atherosclerosis, as well as atherosclerosis of the great vessels of the mediastinum and the coronary arteries, including calcified atherosclerotic plaque in the left main, left anterior descending and left circumflex coronary arteries. Mediastinum/Nodes: No pathologically enlarged mediastinal or hilar lymph nodes. Large hiatal hernia. No axillary lymphadenopathy. Lungs/Pleura: No acute consolidative airspace disease. No pleural effusions. No suspicious appearing pulmonary nodules or masses are noted on today's motion limited examination. Areas of passive atelectasis are noted in the left lower lobe adjacent to  the patient's large hiatal hernia. Upper Abdomen: Unremarkable. Musculoskeletal: There are no aggressive appearing lytic or blastic lesions noted in the visualized portions of the skeleton. Review of the MIP images confirms the above findings. IMPRESSION: 1. Limited examination secondary to patient respiratory motion demonstrating no central, lobar or proximal segmental sized pulmonary embolism. Distal segmental and subsegmental sized emboli can not be entirely excluded. 2. Aortic atherosclerosis, in addition to left main and 2 vessel coronary artery disease. Please note that although the presence of coronary artery calcium documents the presence of coronary artery disease, the severity of this disease and any potential stenosis cannot be assessed on this non-gated CT examination. Assessment for potential risk factor modification, dietary therapy or pharmacologic therapy may be warranted, if clinically indicated. 3. Large hiatal hernia. Aortic Atherosclerosis (ICD10-I70.0). Electronically Signed   By: Vinnie Langton M.D.   On: 06/07/2021 06:57   CT Cervical Spine Wo Contrast  Result Date: 06/12/2021 CLINICAL DATA:  Head trauma moderate to severe.  Fall 2 days ago EXAM: CT HEAD WITHOUT CONTRAST CT CERVICAL SPINE WITHOUT CONTRAST TECHNIQUE: Multidetector CT imaging of the head and cervical spine was performed following the standard protocol without intravenous contrast. Multiplanar CT image reconstructions of the cervical spine were also generated. RADIATION DOSE REDUCTION: This exam was performed according to the departmental dose-optimization program which includes automated exposure  control, adjustment of the mA and/or kV according to patient size and/or use of iterative reconstruction technique. COMPARISON:  CT head 05/07/2021 FINDINGS: CT HEAD FINDINGS Brain: Mild atrophy.  Mild white matter hypodensity bilaterally Negative for acute infarct, hemorrhage, mass Vascular: Negative for hyperdense vessel Skull:  Left parietal craniotomy. Craniotomy flap in good position with bony incorporation. No acute skeletal abnormality Sinuses/Orbits: Mucosal edema paranasal sinuses with air-fluid levels in the right maxillary sinus and sphenoid sinus. Mastoid clear bilaterally. Negative orbit Other: None CT CERVICAL SPINE FINDINGS Alignment: Mild retrolisthesis C2-3.  Mild anterolisthesis C3-4 Skull base and vertebrae: Negative for fracture. 5 x 10 mm cyst in the posterior dens likely degenerative. There is pannus posterior to the dens. Soft tissues and spinal canal: Negative for mass or soft tissue swelling Disc levels: Multilevel disc and facet degeneration. Multilevel foraminal stenosis bilaterally due to spurring. Mild central canal stenosis at C6-7 due to spurring. Upper chest: Lung apices clear bilaterally Other: None IMPRESSION: 1. No acute intracranial abnormality.  Left parietal craniotomy 2. Cervical spondylosis.  Negative for cervical fracture Electronically Signed   By: Franchot Gallo M.D.   On: 06/12/2021 15:16   DG CHEST PORT 1 VIEW  Result Date: 06/16/2021 CLINICAL DATA:  Hypoxia. EXAM: PORTABLE CHEST 1 VIEW COMPARISON:  06/12/2021 FINDINGS: 0939 hours. Low volume film. The cardio pericardial silhouette is enlarged. Large hiatal hernia again noted. Similar appearance left base atelectasis. Interstitial markings are diffusely coarsened with chronic features. Telemetry leads overlie the chest. IMPRESSION: Low volume film without acute cardiopulmonary findings. Electronically Signed   By: Misty Stanley M.D.   On: 06/16/2021 10:22   DG Chest Port 1 View  Result Date: 06/02/2021 CLINICAL DATA:  Weakness EXAM: PORTABLE CHEST 1 VIEW COMPARISON:  01/08/2021 FINDINGS: Left base opacity, likely atelectasis. Right lung clear. Heart is normal size. No effusions or acute bony abnormality. IMPRESSION: Left base atelectasis. Electronically Signed   By: Rolm Baptise M.D.   On: 06/02/2021 20:15   EEG adult  Result Date:  06/18/2021 Lora Havens, MD     06/19/2021  8:09 AM Patient Name: Edwin Snyder MRN: [id number] Epilepsy Attending: Lora Havens Referring Physician/Provider: Kathie Dike, MD Date: 06/18/2021 Duration: 22.25 mins Patient history:  66 year old male with altered mental status.  EEG to evaluate for seizure. Level of alertness: Awake AEDs during EEG study: Ativan Technical aspects: This EEG study was done with scalp electrodes positioned according to the 10-20 International system of electrode placement. Electrical activity was acquired at a sampling rate of 500Hz  and reviewed with a high frequency filter of 70Hz  and a low frequency filter of 1Hz . EEG data were recorded continuously and digitally stored. Description: The posterior dominant rhythm consists of 7 Hz activity of moderate voltage (25-35 uV) seen predominantly in posterior head regions, symmetric and reactive to eye opening and eye closing. EEG showed continuous generalized 6 to 7 Hz theta slowing.  Hyperventilation and photic stimulation were not performed.   Of note, study was technically difficult due to significant movement artifact. ABNORMALITY -Continuous slow, generalized - Background slow IMPRESSION: This technically difficult study is suggestive of moderate diffuse encephalopathy, nonspecific etiology. No seizures or epileptiform discharges were seen throughout the recording. Lora Havens   ECHOCARDIOGRAM COMPLETE  Result Date: 06/06/2021    ECHOCARDIOGRAM REPORT   Patient Name:   KERN GINGRAS Date of Exam: 06/06/2021 Medical Rec #:  [id number]        Height:       73.0 in  Accession #:    [id number]       Weight:       321.0 lb Date of Birth:  Sep 09, 1955        BSA:          2.631 m Patient Age:    82 years         BP:           90/47 mmHg Patient Gender: M                HR:           80 bpm. Exam Location:  [facility] Procedure: 2D Echo, Cardiac Doppler, Color Doppler and Intracardiac            Opacification Agent Indications:     Chest Pain  History:        Patient has no prior history of Echocardiogram examinations.                 Signs/Symptoms:Hypotension and Chest Pain; Risk                 Factors:Hypertension and Dyslipidemia.  Sonographer:    Wenda Low Referring Phys: [id number] COURAGE EMOKPAE  Sonographer Comments: Technically challenging study due to limited acoustic windows and patient is morbidly obese. IMPRESSIONS  1. Left ventricular ejection fraction, by estimation, is 60 to 65%. The left ventricle has normal function. The left ventricle has no regional wall motion abnormalities. There is moderate left ventricular hypertrophy. Left ventricular diastolic parameters were normal.  2. Right ventricular systolic function is normal. The right ventricular size is normal. There is normal pulmonary artery systolic pressure.  3. Left atrial size was mildly dilated.  4. The mitral valve is normal in structure. No evidence of mitral valve regurgitation. No evidence of mitral stenosis.  5. The aortic valve is normal in structure. Aortic valve regurgitation is not visualized. No aortic stenosis is present.  6. The inferior vena cava is normal in size with greater than 50% respiratory variability, suggesting right atrial pressure of 3 mmHg. FINDINGS  Left Ventricle: Left ventricular ejection fraction, by estimation, is 60 to 65%. The left ventricle has normal function. The left ventricle has no regional wall motion abnormalities. Definity contrast agent was given IV to delineate the left ventricular  endocardial borders. The left ventricular internal cavity size was normal in size. There is moderate left ventricular hypertrophy. Left ventricular diastolic parameters were normal. Right Ventricle: The right ventricular size is normal. No increase in right ventricular wall thickness. Right ventricular systolic function is normal. There is normal pulmonary artery systolic pressure. The tricuspid regurgitant velocity is 2.25 m/s, and  with  an assumed right atrial pressure of 3 mmHg, the estimated right ventricular systolic pressure is 09.4 mmHg. Left Atrium: Left atrial size was mildly dilated. Right Atrium: Right atrial size was normal in size. Pericardium: There is no evidence of pericardial effusion. Mitral Valve: The mitral valve is normal in structure. No evidence of mitral valve regurgitation. No evidence of mitral valve stenosis. MV peak gradient, 1.4 mmHg. The mean mitral valve gradient is 0.0 mmHg. Tricuspid Valve: The tricuspid valve is normal in structure. Tricuspid valve regurgitation is not demonstrated. No evidence of tricuspid stenosis. Aortic Valve: The aortic valve is normal in structure. Aortic valve regurgitation is not visualized. No aortic stenosis is present. Aortic valve mean gradient measures 3.0 mmHg. Aortic valve peak gradient measures 4.8 mmHg. Aortic valve area, by VTI measures 2.09 cm. Pulmonic Valve: The  pulmonic valve was normal in structure. Pulmonic valve regurgitation is not visualized. No evidence of pulmonic stenosis. Aorta: The aortic root is normal in size and structure. Venous: The inferior vena cava is normal in size with greater than 50% respiratory variability, suggesting right atrial pressure of 3 mmHg. IAS/Shunts: No atrial level shunt detected by color flow Doppler.  LEFT VENTRICLE PLAX 2D LVIDd:         4.80 cm   Diastology LVIDs:         3.50 cm   LV e' medial:   7.83 cm/s LV PW:         1.20 cm   LV E/e' medial: 6.9 LV IVS:        1.50 cm LVOT diam:     2.00 cm LV SV:         50 LV SV Index:   19 LVOT Area:     3.14 cm  LEFT ATRIUM         Index LA diam:    4.30 cm 1.63 cm/m  AORTIC VALVE                    PULMONIC VALVE AV Area (Vmax):    2.03 cm     PV Vmax:       0.84 m/s AV Area (Vmean):   1.92 cm     PV Peak grad:  2.8 mmHg AV Area (VTI):     2.09 cm AV Vmax:           109.00 cm/s AV Vmean:          77.600 cm/s AV VTI:            0.237 m AV Peak Grad:      4.8 mmHg AV Mean Grad:      3.0 mmHg  LVOT Vmax:         70.50 cm/s LVOT Vmean:        47.500 cm/s LVOT VTI:          0.158 m LVOT/AV VTI ratio: 0.67  AORTA Ao Root diam: 3.20 cm MITRAL VALVE               TRICUSPID VALVE MV Area (PHT): 3.68 cm    TR Peak grad:   20.2 mmHg MV Area VTI:   3.49 cm    TR Vmax:        225.00 cm/s MV Peak grad:  1.4 mmHg MV Mean grad:  0.0 mmHg    SHUNTS MV Vmax:       0.60 m/s    Systemic VTI:  0.16 m MV Vmean:      31.9 cm/s   Systemic Diam: 2.00 cm MV Decel Time: 206 msec MV E velocity: 54.40 cm/s MV A velocity: 55.30 cm/s MV E/A ratio:  0.98 Jenkins Rouge MD Electronically signed by Jenkins Rouge MD Signature Date/Time: 06/06/2021/3:14:49 PM    Final    US Abdomen Limited RUQ (LIVER/GB)  Result Date: 06/12/2021 CLINICAL DATA:  Cholangitis EXAM: ULTRASOUND ABDOMEN LIMITED RIGHT UPPER QUADRANT COMPARISON:  CT abdomen done on 01/26/2014 FINDINGS: Gallbladder: Limited evaluation of gallbladder. There is linear hyperechoic focus along the posterior wall without acoustic shadowing. Technologist did not observe tenderness over the gallbladder. There is no fluid around the gallbladder. Common bile duct: Diameter: 6.8 mm Liver: Limited evaluation. There is slightly inhomogeneous echogenicity in the liver. No discrete focal abnormality is seen. Portal vein is patent on color Doppler imaging with normal direction  of blood flow towards the liver. Other: Examination was technically difficult due to the patient's body habitus and inability to cooperate adequately for the study. IMPRESSION: Technically limited study due to patient's body habitus and inability to cooperate adequately. Possible gallbladder stones. No other significant sonographic abnormality is seen in the right upper quadrant. Electronically Signed   By: Elmer Picker M.D.   On: 06/12/2021 16:44    Microbiology: Results for orders placed or performed during the hospital encounter of 06/12/21  Resp Panel by RT-PCR (Flu A&B, Covid) Nasopharyngeal Swab      Status: Abnormal   Collection Time: 06/12/21  7:53 PM   Specimen: Nasopharyngeal Swab; Nasopharyngeal(NP) swabs in vial transport medium  Result Value Ref Range Status   SARS Coronavirus 2 by RT PCR POSITIVE (A) NEGATIVE Final    Comment: (NOTE) SARS-CoV-2 target nucleic acids are DETECTED.  The SARS-CoV-2 RNA is generally detectable in upper respiratory specimens during the acute phase of infection. Positive results are indicative of the presence of the identified virus, but do not rule out bacterial infection or co-infection with other pathogens not detected by the test. Clinical correlation with patient history and other diagnostic information is necessary to determine patient infection status. The expected result is Negative.  Fact Sheet for Patients: EntrepreneurPulse.com.au  Fact Sheet for Healthcare Providers: IncredibleEmployment.be  This test is not yet approved or cleared by the [place] FDA and  has been authorized for detection and/or diagnosis of SARS-CoV-2 by FDA under an Emergency Use Authorization (EUA).  This EUA will remain in effect (meaning this test can be used) for the duration of  the COVID-19 declaration under Section 564(b)(1) of the A ct, 21 U.S.C. section 360bbb-3(b)(1), unless the authorization is terminated or revoked sooner.     Influenza A by PCR NEGATIVE NEGATIVE Final   Influenza B by PCR NEGATIVE NEGATIVE Final    Comment: (NOTE) The Xpert Xpress SARS-CoV-2/FLU/RSV plus assay is intended as an aid in the diagnosis of influenza from Nasopharyngeal swab specimens and should not be used as a sole basis for treatment. Nasal washings and aspirates are unacceptable for Xpert Xpress SARS-CoV-2/FLU/RSV testing.  Fact Sheet for Patients: EntrepreneurPulse.com.au  Fact Sheet for Healthcare Providers: IncredibleEmployment.be  This test is not yet approved or cleared by the  [place] FDA and has been authorized for detection and/or diagnosis of SARS-CoV-2 by FDA under an Emergency Use Authorization (EUA). This EUA will remain in effect (meaning this test can be used) for the duration of the COVID-19 declaration under Section 564(b)(1) of the Act, 21 U.S.C. section 360bbb-3(b)(1), unless the authorization is terminated or revoked.  Performed at [facility], [address], [place], [place] [address]   MRSA Next Gen by PCR, Nasal     Status: None   Collection Time: 06/13/21  6:58 PM   Specimen: Nasal Mucosa; Nasal Swab  Result Value Ref Range Status   MRSA by PCR Next Gen NOT DETECTED NOT DETECTED Final    Comment: (NOTE) The GeneXpert MRSA Assay (FDA approved for NASAL specimens only), is one component of a comprehensive MRSA colonization surveillance program. It is not intended to diagnose MRSA infection nor to guide or monitor treatment for MRSA infections. Test performance is not FDA approved in patients less than 55 years old. Performed at [facility], [address] [place], [place] [address]   Urine Culture     Status: Abnormal   Collection Time: 06/16/21 10:28 AM   Specimen: Urine, Clean Catch  Result  Value Ref Range Status   Specimen Description   Final    URINE, CLEAN CATCH Performed at [facility], [address], [place], [place] [address]    Special Requests   Final    NONE Performed at [facility], [address], [place], [place] [address]    Culture 80,000 COLONIES/mL YEAST (A)  Final   Report Status 06/19/2021 FINAL  Final    Labs: CBC: Recent Labs  Lab 06/14/21 0424 06/15/21 0416 06/16/21 0625 06/17/21 0525 06/18/21 0549  WBC 4.2 4.3 4.8 5.1 4.8  HGB 7.6* 7.6* 9.2* 9.2* 8.4*  HCT 25.6* 25.8* 30.5* 31.0* 27.5*  MCV 103.6* 102.8* 102.7* 103.3* 101.1*  PLT 179 178 173 180 502   Basic Metabolic Panel: Recent Labs  Lab 06/14/21 0424 06/15/21 0416 06/16/21 0625 06/17/21 0525 06/18/21 0549 06/19/21 0510  06/20/21 0846  NA 132* 132* 133*  --   --   --  135  K 4.6 4.2 4.2  --   --   --  4.3  CL 98 100 100  --   --   --  100  CO2 22 22 23   --   --   --  25  GLUCOSE 93 91 108*  --   --   --  107*  BUN 20 19 16   --   --   --  21  CREATININE 1.63* 1.68* 1.38*  --   --  1.22 1.35*  CALCIUM 7.8* 7.9* 8.0*  --   --   --  8.5*  MG 1.5* 2.3 1.9 1.9 1.5*  --  1.5*   Liver Function Tests: Recent Labs  Lab 06/14/21 0424 06/15/21 0416 06/16/21 0625  AST 32 25 19  ALT 40 31 26  ALKPHOS 553* 480* 454*  BILITOT 0.5 0.4 0.7  PROT 5.4* 5.6* 5.9*  ALBUMIN 2.7* 2.8* 2.8*   CBG: Recent Labs  Lab 06/19/21 1102 06/19/21 1638 06/19/21 2157 06/20/21 0753 06/20/21 1122  GLUCAP 143* 118* 114* 157* 114*    Discharge time spent: greater than 30 minutes.  Signed: Orson Eva, MD [facility] 06/20/2021

## 2021-06-20 NOTE — Assessment & Plan Note (Signed)
Patient has been requesting to discharge home and does not wish to return to SNF. His family has concerns about his safety to return home and are concerned that he may lack decision making capacity.  Patient was directly interviewed in order to assess his abilities to make decisions regarding his discharge disposition.  We discussed his discharge options including going back to SNF vs. Going home and living on his own. Patient did report that he could potentially fall if he went home, but could not identify any other risks. He could not identify any risks and benefits in going to SNF. He simply repeated that he wants to go home.  He was aware that he is currently at [facility], but was not aware of the date. He demonstrated that he had significant memory deficits, that he was unaware of his medical problems, and could not tell me that he has covid. He did not remember working with physical therapy earlier today. He reports that the last time he walked was a few days ago, when by all reports from family,  it has been several months since he ambulated.   Patient has been disoriented most of the day and appears to have a continued encephalopathy. He was unable to show he understood risk and benefits of his discharge options. In his current state, it appears that he lacks real insight into his medical problems and could not show that he appreciated how his decision would be impacted by his obvious physical limitations. Since he is currently unaware of his limitations, I feel that he lacks capacity to make an informed decision regarding the risks of returning home.   His capacity to make these decisions should certainly be revisited in the future if his mental status changes.

## 2021-08-07 ENCOUNTER — Ambulatory Visit: Payer: Self-pay | Admitting: Family Medicine

## 2021-08-24 ENCOUNTER — Ambulatory Visit (INDEPENDENT_AMBULATORY_CARE_PROVIDER_SITE_OTHER): Payer: Medicare Other | Admitting: Family Medicine

## 2021-08-24 ENCOUNTER — Encounter: Payer: Self-pay | Admitting: Family Medicine

## 2021-08-24 VITALS — BP 123/90 | HR 92 | Temp 98.2°F | Ht 73.0 in | Wt 336.0 lb

## 2021-08-24 DIAGNOSIS — R410 Disorientation, unspecified: Secondary | ICD-10-CM

## 2021-08-24 DIAGNOSIS — Z789 Other specified health status: Secondary | ICD-10-CM

## 2021-08-24 DIAGNOSIS — E785 Hyperlipidemia, unspecified: Secondary | ICD-10-CM | POA: Diagnosis not present

## 2021-08-24 DIAGNOSIS — N1831 Chronic kidney disease, stage 3a: Secondary | ICD-10-CM | POA: Diagnosis not present

## 2021-08-24 DIAGNOSIS — I10 Essential (primary) hypertension: Secondary | ICD-10-CM | POA: Diagnosis not present

## 2021-08-24 DIAGNOSIS — Z7409 Other reduced mobility: Secondary | ICD-10-CM

## 2021-08-24 DIAGNOSIS — I959 Hypotension, unspecified: Secondary | ICD-10-CM

## 2021-08-24 DIAGNOSIS — R7989 Other specified abnormal findings of blood chemistry: Secondary | ICD-10-CM

## 2021-08-24 DIAGNOSIS — K219 Gastro-esophageal reflux disease without esophagitis: Secondary | ICD-10-CM | POA: Diagnosis not present

## 2021-08-24 DIAGNOSIS — K5909 Other constipation: Secondary | ICD-10-CM

## 2021-08-24 DIAGNOSIS — R062 Wheezing: Secondary | ICD-10-CM

## 2021-08-24 DIAGNOSIS — E612 Magnesium deficiency: Secondary | ICD-10-CM

## 2021-08-24 DIAGNOSIS — R531 Weakness: Secondary | ICD-10-CM

## 2021-08-24 DIAGNOSIS — Z7689 Persons encountering health services in other specified circumstances: Secondary | ICD-10-CM

## 2021-08-24 DIAGNOSIS — G894 Chronic pain syndrome: Secondary | ICD-10-CM

## 2021-08-24 DIAGNOSIS — E7220 Disorder of urea cycle metabolism, unspecified: Secondary | ICD-10-CM

## 2021-08-24 DIAGNOSIS — F05 Delirium due to known physiological condition: Secondary | ICD-10-CM

## 2021-08-24 DIAGNOSIS — D539 Nutritional anemia, unspecified: Secondary | ICD-10-CM

## 2021-08-24 DIAGNOSIS — E519 Thiamine deficiency, unspecified: Secondary | ICD-10-CM

## 2021-08-24 MED ORDER — ALBUTEROL SULFATE HFA 108 (90 BASE) MCG/ACT IN AERS: 2.0000 | INHALATION_SPRAY | RESPIRATORY_TRACT | 11 refills | Status: DC | PRN

## 2021-08-24 MED ORDER — QUETIAPINE FUMARATE 25 MG PO TABS: 25.0000 mg | ORAL_TABLET | Freq: Every day | ORAL | 1 refills | Status: DC

## 2021-08-24 MED ORDER — MAGNESIUM OXIDE -MG SUPPLEMENT 400 (240 MG) MG PO TABS: 400.0000 mg | ORAL_TABLET | Freq: Two times a day (BID) | ORAL | 3 refills | Status: DC

## 2021-08-24 MED ORDER — OMEPRAZOLE 40 MG PO CPDR: 40.0000 mg | DELAYED_RELEASE_CAPSULE | Freq: Every day | ORAL | 1 refills | Status: DC

## 2021-08-24 MED ORDER — SENNOSIDES-DOCUSATE SODIUM 8.6-50 MG PO TABS: 1.0000 | ORAL_TABLET | Freq: Two times a day (BID) | ORAL | 1 refills | Status: DC

## 2021-08-24 MED ORDER — FOLIC ACID 1 MG PO TABS: 1.0000 mg | ORAL_TABLET | Freq: Every day | ORAL | 1 refills | Status: DC

## 2021-08-24 MED ORDER — MIDODRINE HCL 5 MG PO TABS: 5.0000 mg | ORAL_TABLET | Freq: Three times a day (TID) | ORAL | 1 refills | Status: DC

## 2021-08-24 MED ORDER — SIMVASTATIN 40 MG PO TABS: 40.0000 mg | ORAL_TABLET | Freq: Every day | ORAL | 1 refills | Status: DC

## 2021-08-24 MED ORDER — THIAMINE HCL 100 MG PO TABS: 100.0000 mg | ORAL_TABLET | Freq: Every day | ORAL | 1 refills | Status: DC

## 2021-08-24 MED ORDER — DICLOFENAC SODIUM 1 % EX GEL: 2.0000 g | Freq: Four times a day (QID) | CUTANEOUS | 2 refills | Status: DC | PRN

## 2021-08-24 NOTE — Progress Notes (Signed)
? ?New Patient Office Visit ? ?Subjective   ? ?Patient ID: Edwin Snyder, male    DOB: [birth year]  Age: 66 y.o. MRN: [id number] ? ?CC:  ?Chief Complaint  ?Patient presents with  ? New Patient (Initial Visit)  ? ? ?HPI ?Edwin Snyder presents to establish care ? ?Here with niece today for part of the visit today. He was discharged from a SNF on 08/15/21. He is now residing at home. He lives by himself. He reports that between his two daughters and his niece, someone checks on him multiple times a day. His neighbor also checks on him frequently.  He is currently using a motorized scooter for ambulation. He has difficulty getting around his home because he cannot get his scooter around easily in the home. He reports that a wheelchair has been ordered for him to help. He is unable to walk due to significant weakness. He is able to cook or bathe himself. He is unable to get in and out of his bathtub. Denies falls. He has received a call from PT. They will be coming out 2x a week starting next week.  ? ?He reports significant pain in his legs. This is chronic. He was previously on suboxone for this but has not been for months. He needs a referral to pain management for this. He reports that he has never been to pain management.  ? ?He also needs a referral to neurology. He was admitted to the hospital in February for delirium and acute encephalopathy. 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 He reports that he is not confused. ? ?He needs a refill on all of his medications today. ? ?Outpatient Encounter Medications as of 08/24/2021  ?Medication Sig  ? albuterol (PROVENTIL HFA;VENTOLIN HFA) 108 (90 Base) MCG/ACT inhaler Inhale 2 puffs into the lungs every 4 (four) hours as needed for wheezing or shortness of breath.  ? diclofenac Sodium (VOLTAREN) 1 % GEL Apply 1  application. topically 4 (four) times daily.  ? folic acid (FOLVITE) 1 MG tablet Take 1 mg by mouth daily.  ? ipratropium-albuterol (DUONEB) 0.5-2.5 (3) MG/3ML SOLN Take 3 mLs by nebulization every 6 (six) hours as needed (SOB).  ? magnesium oxide (MAG-OX) 400 (240 Mg) MG tablet Take 1 tablet (400 mg total) by mouth 2 (two) times daily.  ? melatonin 1 MG TABS tablet Take 1 mg by mouth at bedtime.  ? midodrine (PROAMATINE) 5 MG tablet Take 5 mg by mouth 3 (three) times daily.  ? Multiple Vitamin (MULTIVITAMIN) tablet Take 1 tablet by mouth daily.  ? omeprazole (PRILOSEC) 40 MG capsule Take 40 mg by mouth daily.  ? oxyCODONE (OXY IR/ROXICODONE) 5 MG immediate release tablet Take 5 mg by mouth 4 (four) times daily as needed.  ? QUEtiapine (SEROQUEL) 25 MG tablet Take 0.5 tablets (12.5 mg total) by mouth at bedtime. (Patient taking differently: Take 25 mg by mouth at bedtime.)  ? senna-docusate (SENOKOT-S) 8.6-50 MG tablet Take 1 tablet by mouth 2 (two) times daily.  ? simvastatin (ZOCOR) 40 MG tablet Take 40 mg by mouth daily.  ? thiamine 100 MG tablet Take 1 tablet (100 mg total) by mouth daily.  ? [DISCONTINUED] guaiFENesin (MUCINEX) 600 MG 12 hr tablet Take 600 mg by mouth 2 (two) times daily.  ? ?No facility-administered encounter medications on file as of 08/24/2021.  ? ? ?Past Medical History:  ?  Diagnosis Date  ? Anxiety   ? Arthritis   ? Blood transfusion without reported diagnosis   ? Brain tumor ([place])   ? CAD (coronary artery disease)   ? COPD (chronic obstructive pulmonary disease) ([place])   ? Depression   ? Hyperlipidemia   ? Obesity   ? Seizures ([place])   ? Sleep apnea   ? Stroke [facility])   ? ? ?Past Surgical History:  ?Procedure Laterality Date  ? BRAIN SURGERY    ? FOOT SURGERY    ? SKIN LESION EXCISION    ? ? ?History reviewed. No pertinent family history. ? ?Social History  ? ?Socioeconomic History  ? Marital status: Divorced  ?  Spouse name: Not on file  ? Number of children: Not on file  ? Years of education:  Not on file  ? Highest education level: Not on file  ?Occupational History  ? Not on file  ?Tobacco Use  ? Smoking status: Never  ? Smokeless tobacco: Never  ?Substance and Sexual Activity  ? Alcohol use: No  ? Drug use: No  ? Sexual activity: Not Currently  ?Other Topics Concern  ? Not on file  ?Social History Narrative  ? Not on file  ? ?Social Determinants of Health  ? ?Financial Resource Strain: Not on file  ?Food Insecurity: Not on file  ?Transportation Needs: Not on file  ?Physical Activity: Not on file  ?Stress: Not on file  ?Social Connections: Not on file  ?Intimate Partner Violence: Not on file  ? ? ?Review of Systems  ?Constitutional:  Negative for diaphoresis, fever and weight loss.  ?Eyes:  Negative for blurred vision and double vision.  ?Respiratory:  Negative for shortness of breath and wheezing.   ?Cardiovascular:  Negative for chest pain, palpitations, orthopnea and leg swelling.  ?Gastrointestinal:  Negative for vomiting.  ?Genitourinary:  Negative for dysuria.  ?Musculoskeletal:  Negative for falls.  ?Neurological:  Negative for dizziness, seizures and loss of consciousness.  ?Psychiatric/Behavioral:  Negative for hallucinations.   ? ?  ? ?Objective   ? ?BP 123/90   Pulse 92   Temp 98.2 ?F (36.8 ?C) (Temporal)   Ht 6' 1"  (1.854 m)   Wt (!) 336 lb (152.4 kg)   SpO2 94%   BMI 44.33 kg/m?  ? ?Physical Exam ?Vitals and nursing note reviewed.  ?Constitutional:   ?   General: He is not in acute distress. ?   Appearance: He is obese. He is not ill-appearing, toxic-appearing or diaphoretic.  ?HENT:  ?   Head: Normocephalic and atraumatic.  ?   Nose: Nose normal.  ?   Mouth/Throat:  ?   Mouth: Mucous membranes are moist.  ?   Pharynx: Oropharynx is clear.  ?Eyes:  ?   Extraocular Movements: Extraocular movements intact.  ?   Pupils: Pupils are equal, round, and reactive to light.  ?Cardiovascular:  ?   Rate and Rhythm: Normal rate and regular rhythm.  ?   Heart sounds: Normal heart sounds. No murmur  heard. ?Pulmonary:  ?   Effort: Pulmonary effort is normal. No respiratory distress.  ?   Breath sounds: Normal breath sounds.  ?Abdominal:  ?   General: Bowel sounds are normal. There is no distension.  ?   Palpations: Abdomen is soft.  ?   Tenderness: There is no abdominal tenderness. There is no guarding or rebound.  ?Musculoskeletal:  ?   Right lower leg: 1+ Edema present.  ?   Left lower leg: 1+  Edema present.  ?Skin: ?   General: Skin is warm and dry.  ?Neurological:  ?   Mental Status: He is alert and oriented to person, place, and time.  ?   Motor: Weakness (generalized, more significant in BLE) present.  ?   Gait: Gait abnormal (motorized scooter).  ?Psychiatric:     ?   Mood and Affect: Mood normal.     ?   Behavior: Behavior normal.  ? ? ?Last CBC ?Lab Results  ?Component Value Date  ? WBC 6.3 08/24/2021  ? HGB 15.6 08/24/2021  ? HCT 47.8 08/24/2021  ? MCV 90 08/24/2021  ? MCH 29.5 08/24/2021  ? RDW 12.6 08/24/2021  ? PLT 242 08/24/2021  ? ?Last metabolic panel ?Lab Results  ?Component Value Date  ? GLUCOSE 121 (H) 08/24/2021  ? NA 141 08/24/2021  ? K 4.4 08/24/2021  ? CL 103 08/24/2021  ? CO2 19 (L) 08/24/2021  ? BUN 31 (H) 08/24/2021  ? CREATININE 1.38 (H) 08/24/2021  ? EGFR 56 (L) 08/24/2021  ? CALCIUM 10.0 08/24/2021  ? PHOS 3.1 06/06/2021  ? PROT 6.8 08/24/2021  ? ALBUMIN 4.4 08/24/2021  ? LABGLOB 2.4 08/24/2021  ? AGRATIO 1.8 08/24/2021  ? BILITOT 0.3 08/24/2021  ? ALKPHOS 78 08/24/2021  ? AST 17 08/24/2021  ? ALT 12 08/24/2021  ? ANIONGAP 10 06/20/2021  ? ?Last lipids ?Lab Results  ?Component Value Date  ? CHOL 202 (H) 08/24/2021  ? HDL 56 08/24/2021  ? LDLCALC 124 (H) 08/24/2021  ? TRIG 121 08/24/2021  ? CHOLHDL 3.6 08/24/2021  ? ?Last hemoglobin A1c ?Lab Results  ?Component Value Date  ? HGBA1C 5.5 06/13/2021  ? ?Last thyroid functions ?Lab Results  ?Component Value Date  ? TSH 3.200 08/24/2021  ? T3TOTAL 138 06/14/2021  ? T4TOTAL 7.7 08/24/2021  ? ?  ?  ? ?Assessment & Plan:  ? ?Maddyx was seen  today for new patient (initial visit). ? ?Diagnoses and all orders for this visit: ? ?Primary hypertension ?Well controlled. Not currently on antihypertensives. Labs pending.  ?-     Anemia Profile B ?-     CMP1

## 2021-08-25 LAB — ANEMIA PROFILE B
Basophils Absolute: 0.1 10*3/uL (ref 0.0–0.2)
Basos: 1 %
EOS (ABSOLUTE): 0.1 10*3/uL (ref 0.0–0.4)
Eos: 2 %
Ferritin: 191 ng/mL (ref 30–400)
Folate: >20 ng/mL (ref >3.0)
Hematocrit: 47.8 % (ref 37.5–51.0)
Hemoglobin: 15.6 g/dL (ref 13.0–17.7)
Immature Grans (Abs): 0 10*3/uL (ref 0.0–0.1)
Immature Granulocytes: 0 %
Iron Saturation: 24 % (ref 15–55)
Iron: 68 ug/dL (ref 38–169)
Lymphocytes Absolute: 1.3 10*3/uL (ref 0.7–3.1)
Lymphs: 20 %
MCH: 29.5 pg (ref 26.6–33.0)
MCHC: 32.6 g/dL (ref 31.5–35.7)
MCV: 90 fL (ref 79–97)
Monocytes Absolute: 0.5 10*3/uL (ref 0.1–0.9)
Monocytes: 8 %
Neutrophils Absolute: 4.3 10*3/uL (ref 1.4–7.0)
Neutrophils: 69 %
Platelets: 242 10*3/uL (ref 150–450)
RBC: 5.29 x10E6/uL (ref 4.14–5.80)
RDW: 12.6 % (ref 11.6–15.4)
Retic Ct Pct: 1 % (ref 0.6–2.6)
Total Iron Binding Capacity: 284 ug/dL (ref 250–450)
UIBC: 216 ug/dL (ref 111–343)
Vitamin B-12: 228 pg/mL — ABNORMAL LOW (ref 232–1245)
WBC: 6.3 10*3/uL (ref 3.4–10.8)

## 2021-08-25 LAB — CMP14+EGFR
ALT: 12 IU/L (ref 0–44)
AST: 17 IU/L (ref 0–40)
Albumin/Globulin Ratio: 1.8 (ref 1.2–2.2)
Albumin: 4.4 g/dL (ref 3.8–4.8)
Alkaline Phosphatase: 78 IU/L (ref 44–121)
BUN/Creatinine Ratio: 22 (ref 10–24)
BUN: 31 mg/dL — ABNORMAL HIGH (ref 8–27)
Bilirubin Total: 0.3 mg/dL (ref 0.0–1.2)
CO2: 19 mmol/L — ABNORMAL LOW (ref 20–29)
Calcium: 10 mg/dL (ref 8.6–10.2)
Chloride: 103 mmol/L (ref 96–106)
Creatinine, Ser: 1.38 mg/dL — ABNORMAL HIGH (ref 0.76–1.27)
Globulin, Total: 2.4 g/dL (ref 1.5–4.5)
Glucose: 121 mg/dL — ABNORMAL HIGH (ref 70–99)
Potassium: 4.4 mmol/L (ref 3.5–5.2)
Sodium: 141 mmol/L (ref 134–144)
Total Protein: 6.8 g/dL (ref 6.0–8.5)
eGFR: 56 mL/min/{1.73_m2} — ABNORMAL LOW (ref >59)

## 2021-08-25 LAB — THYROID PANEL WITH TSH
Free Thyroxine Index: 2 (ref 1.2–4.9)
T3 Uptake Ratio: 26 % (ref 24–39)
T4, Total: 7.7 ug/dL (ref 4.5–12.0)
TSH: 3.2 u[IU]/mL (ref 0.450–4.500)

## 2021-08-25 LAB — LIPID PANEL
Chol/HDL Ratio: 3.6 ratio (ref 0.0–5.0)
Cholesterol, Total: 202 mg/dL — ABNORMAL HIGH (ref 100–199)
HDL: 56 mg/dL (ref >39)
LDL Chol Calc (NIH): 124 mg/dL — ABNORMAL HIGH (ref 0–99)
Triglycerides: 121 mg/dL (ref 0–149)
VLDL Cholesterol Cal: 22 mg/dL (ref 5–40)

## 2021-08-25 LAB — AMMONIA: Ammonia: 41 ug/dL (ref 40–200)

## 2021-08-30 ENCOUNTER — Other Ambulatory Visit: Payer: Self-pay | Admitting: Family Medicine

## 2021-08-30 DIAGNOSIS — E538 Deficiency of other specified B group vitamins: Secondary | ICD-10-CM

## 2021-08-30 DIAGNOSIS — E785 Hyperlipidemia, unspecified: Secondary | ICD-10-CM

## 2021-08-30 MED ORDER — VITAMIN B-12 1000 MCG PO TABS: 1000.0000 ug | ORAL_TABLET | Freq: Every day | ORAL | 1 refills | Status: DC

## 2021-08-30 MED ORDER — EZETIMIBE 10 MG PO TABS: 10.0000 mg | ORAL_TABLET | Freq: Every day | ORAL | 3 refills | Status: DC

## 2021-09-06 ENCOUNTER — Ambulatory Visit: Payer: Medicare Other

## 2021-09-06 ENCOUNTER — Encounter: Payer: Self-pay | Admitting: Family Medicine

## 2021-09-06 ENCOUNTER — Ambulatory Visit (INDEPENDENT_AMBULATORY_CARE_PROVIDER_SITE_OTHER): Payer: Medicare Other | Admitting: Family Medicine

## 2021-09-06 VITALS — BP 125/86 | HR 89 | Temp 98.4°F | Ht 73.0 in

## 2021-09-06 DIAGNOSIS — R410 Disorientation, unspecified: Secondary | ICD-10-CM

## 2021-09-06 DIAGNOSIS — Z7409 Other reduced mobility: Secondary | ICD-10-CM | POA: Diagnosis not present

## 2021-09-06 DIAGNOSIS — I10 Essential (primary) hypertension: Secondary | ICD-10-CM

## 2021-09-06 DIAGNOSIS — R531 Weakness: Secondary | ICD-10-CM

## 2021-09-06 DIAGNOSIS — Z789 Other specified health status: Secondary | ICD-10-CM

## 2021-09-06 DIAGNOSIS — G629 Polyneuropathy, unspecified: Secondary | ICD-10-CM

## 2021-09-06 DIAGNOSIS — G894 Chronic pain syndrome: Secondary | ICD-10-CM

## 2021-09-06 MED ORDER — GABAPENTIN 600 MG PO TABS: 600.0000 mg | ORAL_TABLET | Freq: Two times a day (BID) | ORAL | 1 refills | Status: DC

## 2021-09-06 NOTE — Progress Notes (Addendum)
Established Patient Office Visit  Subjective   Patient ID: ALIREZA POLLACK, male    DOB: May 17, 1955  Age: 66 y.o. MRN: [id number]  Chief Complaint  Patient presents with   Medical Management of Chronic Issues    HPI Damario reports doing ok since his last visit and discharge from SNF. His niece stops by daily to bring him at least 2 meals, typically takeout meals. He denies falls since being home. He reports that [facility] has been out a few times. PT has not yet been started as it requires 2 people. It does appear that they have been out for at least an evaluation. He continues to report pain in both lower legs and feet from neuropathy. He does have an appointment schedule with pain management. He states that he has never tried gabapentin before. He does have an appt with neurology in a few weeks.      09/06/2021    3:01 PM  Depression screen PHQ 2/9  Decreased Interest 0  Down, Depressed, Hopeless 2  PHQ - 2 Score 2  Altered sleeping 1  Tired, decreased energy 1  Change in appetite 0  Feeling bad or failure about yourself  0  Trouble concentrating 0  Moving slowly or fidgety/restless 0  Suicidal thoughts 0  PHQ-9 Score 4  Difficult doing work/chores Very difficult      09/06/2021    3:01 PM  GAD 7 : Generalized Anxiety Score  Nervous, Anxious, on Edge 1  Control/stop worrying 1  Worry too much - different things 2  Trouble relaxing 2  Restless 0  Easily annoyed or irritable 1  Afraid - awful might happen 2  Total GAD 7 Score 9  Anxiety Difficulty Very difficult      Past Medical History:  Diagnosis Date   Anxiety    Arthritis    Blood transfusion without reported diagnosis    Brain tumor ([place])    CAD (coronary artery disease)    COPD (chronic obstructive pulmonary disease) (HCC)    Depression    Hyperlipidemia    Obesity    Seizures ([place])    Sleep apnea    Stroke ([place])       Review of Systems  Constitutional:  Negative for chills, fever and weight loss.   Eyes:  Negative for blurred vision and double vision.  Respiratory:  Negative for shortness of breath and wheezing.   Cardiovascular:  Negative for chest pain, palpitations, orthopnea and leg swelling.  Gastrointestinal:  Negative for abdominal pain, diarrhea and vomiting.  Musculoskeletal:  Positive for myalgias. Negative for falls.  Neurological:  Positive for weakness (generalized). Negative for dizziness, focal weakness, seizures and loss of consciousness.  Psychiatric/Behavioral:  Negative for hallucinations, memory loss and suicidal ideas.      Objective:     BP 125/86   Pulse 89   Temp 98.4 F (36.9 C) (Temporal)   Ht 6' 1" (1.854 m)   SpO2 96%   BMI 44.33 kg/m  BP Readings from Last 3 Encounters:  09/06/21 125/86  08/24/21 123/90  06/20/21 117/73      Physical Exam Vitals and nursing note reviewed.  Constitutional:      General: He is not in acute distress.    Appearance: He is obese. He is not ill-appearing, toxic-appearing or diaphoretic.  HENT:     Head: Normocephalic and atraumatic.     Nose: Nose normal.     Mouth/Throat:     Mouth: Mucous membranes are moist.  Pharynx: Oropharynx is clear. No oropharyngeal exudate or posterior oropharyngeal erythema.  Eyes:     General: No scleral icterus.    Extraocular Movements: Extraocular movements intact.     Pupils: Pupils are equal, round, and reactive to light.  Cardiovascular:     Rate and Rhythm: Normal rate and regular rhythm.     Heart sounds: Normal heart sounds. No murmur heard. Pulmonary:     Effort: Pulmonary effort is normal. No respiratory distress.     Breath sounds: Normal breath sounds. No wheezing, rhonchi or rales.  Chest:     Chest wall: No tenderness.  Abdominal:     General: Bowel sounds are normal. There is no distension.     Palpations: Abdomen is soft.     Tenderness: There is no abdominal tenderness. There is no guarding or rebound.  Musculoskeletal:     Right lower leg: No  edema.     Left lower leg: No edema.  Skin:    General: Skin is warm and dry.     Findings: No erythema.  Neurological:     Mental Status: He is alert and oriented to person, place, and time. Mental status is at baseline.     Motor: Weakness (bilateral LE, baseline) present.     Gait: Gait abnormal (motorized scooter).  Psychiatric:        Mood and Affect: Mood normal.        Behavior: Behavior normal.     No results found for any visits on 09/06/21.  Last CBC Lab Results  Component Value Date   WBC 6.3 08/24/2021   HGB 15.6 08/24/2021   HCT 47.8 08/24/2021   MCV 90 08/24/2021   MCH 29.5 08/24/2021   RDW 12.6 08/24/2021   PLT 242 91/69/4503   Last metabolic panel Lab Results  Component Value Date   GLUCOSE 121 (H) 08/24/2021   NA 141 08/24/2021   K 4.4 08/24/2021   CL 103 08/24/2021   CO2 19 (L) 08/24/2021   BUN 31 (H) 08/24/2021   CREATININE 1.38 (H) 08/24/2021   EGFR 56 (L) 08/24/2021   CALCIUM 10.0 08/24/2021   PHOS 3.1 06/06/2021   PROT 6.8 08/24/2021   ALBUMIN 4.4 08/24/2021   LABGLOB 2.4 08/24/2021   AGRATIO 1.8 08/24/2021   BILITOT 0.3 08/24/2021   ALKPHOS 78 08/24/2021   AST 17 08/24/2021   ALT 12 08/24/2021   ANIONGAP 10 06/20/2021   Last lipids Lab Results  Component Value Date   CHOL 202 (H) 08/24/2021   HDL 56 08/24/2021   LDLCALC 124 (H) 08/24/2021   TRIG 121 08/24/2021   CHOLHDL 3.6 08/24/2021      The 10-year ASCVD risk score (Arnett DK, et al., 2019) is: 14.5%    Assessment & Plan:   Ojas was seen today for medical management of chronic issues.  Diagnoses and all orders for this visit:  Primary hypertension Well controlled on current regimen.   Generalized weakness Impaired mobility and ADLs Will fax RX for bariatric wheelchair. Patient suffers from neuropathy, weakness, and chronic pain which impairs their ability to perform daily activities like bathing, dressing, grooming, and toileting in the home.  A cane, crutch, or  walker will not resolve issue with performing activities of daily living. A wheelchair will allow patient to safely perform daily activities. Patient can safely propel the wheelchair in the home or has a caregiver who can provide assistance. Length of need Lifetime. Accessories: elevating leg rests (ELRs), wheel locks, extensions and  anti-tippers. Needs bariatric wheelchair. He is only able to transfer from chair to bed and is not able to ambulate. He currently is using a motorized scooter but is unable to navigate around his house well with this. Established with HH and PT. Reporting staffing difficulties as it is required 2 people for PT. Patient will keep me updated on the status of this. Denies falls.   Chronic pain syndrome Neuropathy Upcoming appt with pain management. Will try gabapentin as below.  -     gabapentin (NEURONTIN) 600 MG tablet; Take 1 tablet (600 mg total) by mouth 2 (two) times daily.  Delirium Denies current symptoms. Has upcoming appt with neurology for evaluation.   Return in about 3 months (around 12/03/2021) for chronic follow up.   The patient indicates understanding of these issues and agrees with the plan.  Gwenlyn Perking, FNP

## 2021-09-06 NOTE — Patient Instructions (Signed)
Neuropathic Pain Neuropathic pain is pain caused by damage to the nerves that are responsible for certain sensations in your body (sensory nerves). Neuropathic pain can make you more sensitive to pain. Even a minor sensation can feel very painful. This is usually a long-term (chronic) condition that can be difficult to treat. The type of pain differs from person to person. It may: Start suddenly (acute), or it may develop slowly and become chronic. Come and go as damaged nerves heal, or it may stay at the same level for years. Cause emotional distress, loss of sleep, and a lower quality of life. What are the causes? The most common cause of this condition is diabetes. Many other diseases and conditions can also cause neuropathic pain. Causes of neuropathic pain can be classified as: Toxic. This is caused by medicines and chemicals. The most common causes of toxic neuropathic pain is damage from medicines that kill cancer cells (chemotherapy) or alcohol abuse. Metabolic. This can be caused by: Diabetes. Lack of vitamins like B12. Traumatic. Any injury that cuts, crushes, or stretches a nerve can cause damage and pain. Compression-related. If a sensory nerve gets trapped or compressed for a long period of time, the blood supply to the nerve can be cut off. Vascular. Many blood vessel diseases can cause neuropathic pain by decreasing blood supply and oxygen to nerves. Autoimmune. This type of pain results from diseases in which the body's defense system (immune system) mistakenly attacks sensory nerves. Examples of autoimmune diseases that can cause neuropathic pain include lupus and multiple sclerosis. Infectious. Many types of viral infections can damage sensory nerves and cause pain. Shingles infection is a common cause of this type of pain. Inherited. Neuropathic pain can be a symptom of many diseases that are passed down through families (genetic). What increases the risk? You are more likely to  develop this condition if: You have diabetes. You smoke. You drink too much alcohol. You are taking certain medicines, including chemotherapy or medicines that treat immune system disorders. What are the signs or symptoms? The main symptom is pain. Neuropathic pain is often described as: Burning. Shock-like. Stinging. Hot or cold. Itching. How is this diagnosed? No single test can diagnose neuropathic pain. It is diagnosed based on: A physical exam and your symptoms. Your health care provider will ask you about your pain. You may be asked to use a pain scale to describe how bad your pain is. Tests. These may be done to see if you have a cause and location of any nerve damage. They include: Nerve conduction studies and electromyography to test how well nerve signals travel through your nerves and muscles (electrodiagnostic testing). Skin biopsy to evaluate for small fiber neuropathy. Imaging studies, such as: X-rays. CT scan. MRI. How is this treated? Treatment for neuropathic pain may change over time. You may need to try different treatment options or a combination of treatments. Some options include: Treating the underlying cause of the neuropathy, such as diabetes, kidney disease, or vitamin deficiencies. Stopping medicines that can cause neuropathy, such as chemotherapy. Medicine to relieve pain. Medicines may include: Prescription or over-the-counter pain medicine. Anti-seizure medicine. Antidepressant medicines. Pain-relieving patches or creams that are applied to painful areas of skin. A medicine to numb the area (local anesthetic), which can be injected as a nerve block. Transcutaneous nerve stimulation. This uses electrical currents to block painful nerve signals. The treatment is painless. Alternative treatments, such as: Acupuncture. Meditation. Massage. Occupational or physical therapy. Pain management programs. Counseling. Follow   these instructions at  home: Medicines  Take over-the-counter and prescription medicines only as told by your health care provider. Ask your health care provider if the medicine prescribed to you: Requires you to avoid driving or using machinery. Can cause constipation. You may need to take these actions to prevent or treat constipation: Drink enough fluid to keep your urine pale yellow. Take over-the-counter or prescription medicines. Eat foods that are high in fiber, such as beans, whole grains, and fresh fruits and vegetables. Limit foods that are high in fat and processed sugars, such as fried or sweet foods. Lifestyle  Have a good support system at home. Consider joining a chronic pain support group. Do not use any products that contain nicotine or tobacco. These products include cigarettes, chewing tobacco, and vaping devices, such as e-cigarettes. If you need help quitting, ask your health care provider. Do not drink alcohol. General instructions Learn as much as you can about your condition. Work closely with all your health care providers to find the treatment plan that works best for you. Ask your health care provider what activities are safe for you. Keep all follow-up visits. This is important. Contact a health care provider if: Your pain treatments are not working. You are having side effects from your medicines. You are struggling with tiredness (fatigue), mood changes, depression, or anxiety. Get help right away if: You have thoughts of hurting yourself. Get help right away if you feel like you may hurt yourself or others, or have thoughts about taking your own life. Go to your nearest emergency room or: Call 911. Call the National Suicide Prevention Lifeline at 1-800-273-8255 or 988. This is open 24 hours a day. Text the Crisis Text Line at 741741. Summary Neuropathic pain is pain caused by damage to the nerves that are responsible for certain sensations in your body (sensory  nerves). Neuropathic pain may come and go as damaged nerves heal, or it may stay at the same level for years. Neuropathic pain is usually a long-term condition that can be difficult to treat. Consider joining a chronic pain support group. This information is not intended to replace advice given to you by your health care provider. Make sure you discuss any questions you have with your health care provider. Document Revised: 12/11/2020 Document Reviewed: 12/11/2020 Elsevier Patient Education  2023 Elsevier Inc.  

## 2021-09-10 ENCOUNTER — Ambulatory Visit (INDEPENDENT_AMBULATORY_CARE_PROVIDER_SITE_OTHER): Payer: Medicare Other

## 2021-09-10 DIAGNOSIS — G894 Chronic pain syndrome: Secondary | ICD-10-CM | POA: Diagnosis not present

## 2021-09-10 DIAGNOSIS — I129 Hypertensive chronic kidney disease with stage 1 through stage 4 chronic kidney disease, or unspecified chronic kidney disease: Secondary | ICD-10-CM

## 2021-09-10 DIAGNOSIS — M1990 Unspecified osteoarthritis, unspecified site: Secondary | ICD-10-CM | POA: Diagnosis not present

## 2021-09-10 DIAGNOSIS — E46 Unspecified protein-calorie malnutrition: Secondary | ICD-10-CM

## 2021-09-10 DIAGNOSIS — N183 Chronic kidney disease, stage 3 unspecified: Secondary | ICD-10-CM

## 2021-09-10 DIAGNOSIS — R296 Repeated falls: Secondary | ICD-10-CM

## 2021-09-10 DIAGNOSIS — G9341 Metabolic encephalopathy: Secondary | ICD-10-CM

## 2021-09-10 DIAGNOSIS — F05 Delirium due to known physiological condition: Secondary | ICD-10-CM

## 2021-09-10 DIAGNOSIS — D539 Nutritional anemia, unspecified: Secondary | ICD-10-CM

## 2021-09-10 DIAGNOSIS — K219 Gastro-esophageal reflux disease without esophagitis: Secondary | ICD-10-CM

## 2021-09-10 DIAGNOSIS — E519 Thiamine deficiency, unspecified: Secondary | ICD-10-CM

## 2021-09-10 DIAGNOSIS — E724 Disorders of ornithine metabolism: Secondary | ICD-10-CM

## 2021-09-10 DIAGNOSIS — F321 Major depressive disorder, single episode, moderate: Secondary | ICD-10-CM

## 2021-09-10 DIAGNOSIS — I2510 Atherosclerotic heart disease of native coronary artery without angina pectoris: Secondary | ICD-10-CM

## 2021-09-11 ENCOUNTER — Encounter: Payer: Self-pay | Admitting: Physical Medicine & Rehabilitation

## 2021-09-19 ENCOUNTER — Emergency Department (HOSPITAL_COMMUNITY): Payer: Medicare Other

## 2021-09-19 ENCOUNTER — Other Ambulatory Visit: Payer: Self-pay

## 2021-09-19 ENCOUNTER — Encounter (HOSPITAL_COMMUNITY): Payer: Self-pay

## 2021-09-19 ENCOUNTER — Emergency Department (HOSPITAL_COMMUNITY)
Admission: EM | Admit: 2021-09-19 | Discharge: 2021-09-20 | Disposition: A | Discharge status: Home / Self Care | Payer: Medicare Other | Attending: Emergency Medicine | Admitting: Emergency Medicine

## 2021-09-19 DIAGNOSIS — I2510 Atherosclerotic heart disease of native coronary artery without angina pectoris: Secondary | ICD-10-CM | POA: Diagnosis not present

## 2021-09-19 DIAGNOSIS — R202 Paresthesia of skin: Secondary | ICD-10-CM | POA: Diagnosis not present

## 2021-09-19 DIAGNOSIS — N289 Disorder of kidney and ureter, unspecified: Secondary | ICD-10-CM

## 2021-09-19 DIAGNOSIS — H5460 Unqualified visual loss, one eye, unspecified: Secondary | ICD-10-CM

## 2021-09-19 DIAGNOSIS — H5462 Unqualified visual loss, left eye, normal vision right eye: Secondary | ICD-10-CM | POA: Insufficient documentation

## 2021-09-19 DIAGNOSIS — N189 Chronic kidney disease, unspecified: Secondary | ICD-10-CM | POA: Diagnosis not present

## 2021-09-19 DIAGNOSIS — J449 Chronic obstructive pulmonary disease, unspecified: Secondary | ICD-10-CM | POA: Diagnosis not present

## 2021-09-19 DIAGNOSIS — I129 Hypertensive chronic kidney disease with stage 1 through stage 4 chronic kidney disease, or unspecified chronic kidney disease: Secondary | ICD-10-CM | POA: Diagnosis not present

## 2021-09-19 DIAGNOSIS — H538 Other visual disturbances: Secondary | ICD-10-CM | POA: Diagnosis not present

## 2021-09-19 DIAGNOSIS — H53122 Transient visual loss, left eye: Secondary | ICD-10-CM | POA: Diagnosis present

## 2021-09-19 LAB — BASIC METABOLIC PANEL
Anion gap: 10 (ref 5–15)
BUN: 19 mg/dL (ref 8–23)
CO2: 25 mmol/L (ref 22–32)
Calcium: 9.4 mg/dL (ref 8.9–10.3)
Chloride: 104 mmol/L (ref 98–111)
Creatinine, Ser: 1.29 mg/dL — ABNORMAL HIGH (ref 0.61–1.24)
GFR, Estimated: >60 mL/min (ref >60)
Glucose, Bld: 120 mg/dL — ABNORMAL HIGH (ref 70–99)
Potassium: 3.9 mmol/L (ref 3.5–5.1)
Sodium: 139 mmol/L (ref 135–145)

## 2021-09-19 LAB — CBC
HCT: 46.8 % (ref 39.0–52.0)
Hemoglobin: 15.5 g/dL (ref 13.0–17.0)
MCH: 29.5 pg (ref 26.0–34.0)
MCHC: 33.1 g/dL (ref 30.0–36.0)
MCV: 89.1 fL (ref 80.0–100.0)
Platelets: 226 10*3/uL (ref 150–400)
RBC: 5.25 MIL/uL (ref 4.22–5.81)
RDW: 14.3 % (ref 11.5–15.5)
WBC: 9 10*3/uL (ref 4.0–10.5)
nRBC: 0 % (ref 0.0–0.2)

## 2021-09-19 LAB — AMMONIA: Ammonia: 20 umol/L (ref 9–35)

## 2021-09-19 NOTE — ED Triage Notes (Signed)
Rcems  from home. Cc of blurry vision in h is left eye since 10pm last night while he was watching tv. States he has decreased sensation in his left leg.  Denies any pain. Reports medication compliance.  20l ac by ems.

## 2021-09-19 NOTE — ED Provider Notes (Signed)
[facility] EMERGENCY DEPARTMENT Provider Note   CSN: [id number] Arrival date & time: 09/19/21  1939     History {Add pertinent medical, surgical, social history, OB history to HPI:1} Chief Complaint  Patient presents with  . Loss of Vision  . Weakness    Edwin Snyder is a 66 y.o. male.  The history is provided by the patient.  Weakness He has history of hypertension, hyperlipidemia, chronic kidney disease, COPD, coronary artery disease, and comes in because of decreased vision in his left eye, numbness in his left leg.  He was watching television last night when things went completely black in his left eye.  He did not think anything of it and went to sleep.  When he woke up, vision was improved, but still blurred.  Then during the day today, his vision went black in the left eye again.  He has noticed today that his left leg is completely numb.  He has chronic numbness in his feet, but this is much worse than baseline.  He has not noticed any weakness.  He denies any headache.   Home Medications Prior to Admission medications   Medication Sig Start Date End Date Taking? Authorizing Provider  albuterol (VENTOLIN HFA) 108 (90 Base) MCG/ACT inhaler Inhale 2 puffs into the lungs every 4 (four) hours as needed for wheezing or shortness of breath. 08/24/21   Gwenlyn Perking, FNP  diclofenac Sodium (VOLTAREN) 1 % GEL Apply 2 g topically 4 (four) times daily as needed (pain). 08/24/21   Gwenlyn Perking, FNP  ezetimibe (ZETIA) 10 MG tablet Take 1 tablet (10 mg total) by mouth daily. 08/30/21   Gwenlyn Perking, FNP  folic acid (FOLVITE) 1 MG tablet Take 1 tablet (1 mg total) by mouth daily. 08/24/21   Gwenlyn Perking, FNP  gabapentin (NEURONTIN) 600 MG tablet Take 1 tablet (600 mg total) by mouth 2 (two) times daily. 09/06/21   Gwenlyn Perking, FNP  ipratropium-albuterol (DUONEB) 0.5-2.5 (3) MG/3ML SOLN Take 3 mLs by nebulization every 6 (six) hours as needed (SOB).    [provider]  magnesium oxide (MAG-OX) 400 (240 Mg) MG tablet Take 1 tablet (400 mg total) by mouth 2 (two) times daily. 08/24/21   Gwenlyn Perking, FNP  melatonin 1 MG TABS tablet Take 1 mg by mouth at bedtime.    [provider]  midodrine (PROAMATINE) 5 MG tablet Take 1 tablet (5 mg total) by mouth 3 (three) times daily. 08/24/21   Gwenlyn Perking, FNP  Multiple Vitamin (MULTIVITAMIN) tablet Take 1 tablet by mouth daily.    [provider]  omeprazole (PRILOSEC) 40 MG capsule Take 1 capsule (40 mg total) by mouth daily. 08/24/21   Gwenlyn Perking, FNP  oxyCODONE (OXY IR/ROXICODONE) 5 MG immediate release tablet Take 5 mg by mouth 4 (four) times daily as needed. 08/15/21   [provider]  QUEtiapine (SEROQUEL) 25 MG tablet Take 1 tablet (25 mg total) by mouth at bedtime. 08/24/21   Gwenlyn Perking, FNP  senna-docusate (SENOKOT-S) 8.6-50 MG tablet Take 1 tablet by mouth 2 (two) times daily. 08/24/21   Gwenlyn Perking, FNP  simvastatin (ZOCOR) 40 MG tablet Take 1 tablet (40 mg total) by mouth daily. 08/24/21   Gwenlyn Perking, FNP  thiamine 100 MG tablet Take 1 tablet (100 mg total) by mouth daily. 08/24/21   Gwenlyn Perking, FNP  vitamin B-12 (CYANOCOBALAMIN) 1000 MCG tablet Take 1 tablet (1,000 mcg  total) by mouth daily. 08/30/21   Gwenlyn Perking, FNP      Allergies    Clindamycin/lincomycin, Penicillins, and Ivp dye [iodinated contrast media]    Review of Systems   Review of Systems  Neurological:  Positive for weakness.  All other systems reviewed and are negative.  Physical Exam Updated Vital Signs BP (!) 157/114 (BP Location: Right Arm)   Pulse 86   Temp 98.2 F (36.8 C) (Oral)   Resp 15   Ht '6\' 1"'$  (1.854 m)   Wt (!) 152.4 kg   SpO2 96%   BMI 44.33 kg/m  Physical Exam Vitals and nursing note reviewed.  66 year old male, resting comfortably and in no acute distress. Vital signs are significant for elevated blood pressure. Oxygen saturation  is 96%, which is normal. Head is normocephalic and atraumatic. PERRLA, EOMI. Oropharynx is clear.  Fundi are visible, no evidence of vitreous hemorrhage. Neck is nontender and supple without adenopathy or JVD.  There are no carotid bruits. Back is nontender and there is no CVA tenderness. Lungs are clear without rales, wheezes, or rhonchi. Chest is nontender. Heart has regular rate and rhythm without murmur. Abdomen is soft, flat, nontender. Extremities have trace edema, full range of motion is present. Skin is warm and dry without rash. Neurologic: Awake and alert, oriented x3.  Speech is normal.  He is able to count fingers with his left eye, but states he cannot make out my face.  We will check formal visual acuity.  Visual fields are intact to confrontation.  Facial sensation is normal throughout, tongue protrudes in the midline, shoulder shrug is normal.  Strength is 5/5 in all 4 extremities.  There is no pronator drift.  There is markedly decreased sensation in the left foot and leg compared with the right, sensation is symmetric in his arms and there is no extinction on double simultaneous stimulation of arms.  He states that he has not been able to walk for a long time, so gait is not tested.  ED Results / Procedures / Treatments   Labs (all labs ordered are listed, but only abnormal results are displayed) Labs Reviewed  BASIC METABOLIC PANEL - Abnormal; Notable for the following components:      Result Value   Glucose, Bld 120 (*)    Creatinine, Ser 1.29 (*)    All other components within normal limits  CBC  AMMONIA  URINALYSIS, ROUTINE W REFLEX MICROSCOPIC    EKG EKG Interpretation  Date/Time:  Wednesday Sep 19 2021 19:47:45 EDT Ventricular Rate:  89 PR Interval:  138 QRS Duration: 82 QT Interval:  358 QTC Calculation: 435 R Axis:   -17 Text Interpretation: Normal sinus rhythm Normal ECG When compared with ECG of 13-Jun-2021 20:41, Nonspecific T wave abnormality no  longer evident in Anterior leads Confirmed by Delora Fuel ([id number]) on 09/19/2021 10:57:19 PM  Radiology CT HEAD WO CONTRAST  Result Date: 09/19/2021 CLINICAL DATA:  Dizziness EXAM: CT HEAD WITHOUT CONTRAST TECHNIQUE: Contiguous axial images were obtained from the base of the skull through the vertex without intravenous contrast. RADIATION DOSE REDUCTION: This exam was performed according to the departmental dose-optimization program which includes automated exposure control, adjustment of the mA and/or kV according to patient size and/or use of iterative reconstruction technique. COMPARISON:  CT brain 06/16/2021 FINDINGS: Brain: No acute territorial infarction, hemorrhage or intracranial mass. The ventricles are nonenlarged. Vascular: No hyperdense vessels. Scattered carotid vascular calcification Skull: No fracture.  Prior left craniotomy Sinuses/Orbits:  No acute finding. Other: None IMPRESSION: 1. No CT evidence for acute intracranial abnormality. 2. Previous left craniotomy Electronically Signed   By: Donavan Foil M.D.   On: 09/19/2021 20:25    Procedures Procedures  Cardiac monitor shows normal sinus rhythm, per my interpretation.  Medications Ordered in ED Medications - No data to display  ED Course/ Medical Decision Making/ A&P                           Medical Decision Making Amount and/or Complexity of Data Reviewed Labs: ordered. Radiology: ordered.   Vision loss of the left eye with associated numbness of the left foot concerning for stroke.  It has been greater than 24 hours since onset of symptoms, so he is outside of the window for code stroke, and outside any treatment window for thrombolytic therapy and endovascular therapy.  I have interpreted all of the labs, and he has a normal CBC and mild renal insufficiency which is unchanged from baseline.  Ammonia level is normal.  CT of head shows no acute abnormality.  I have independently viewed the images, and agree with the  radiologist's interpretation.  We will need to obtain MRI to evaluate for possible stroke.  {Document critical care time when appropriate:1} {Document review of labs and clinical decision tools ie heart score, Chads2Vasc2 etc:1}  {Document your independent review of radiology images, and any outside records:1} {Document your discussion with family members, caretakers, and with consultants:1} {Document social determinants of health affecting pt's care:1} {Document your decision making why or why not admission, treatments were needed:1} Final Clinical Impression(s) / ED Diagnoses Final diagnoses:  None    Rx / DC Orders ED Discharge Orders     None

## 2021-09-20 ENCOUNTER — Emergency Department (HOSPITAL_COMMUNITY): Payer: Medicare Other

## 2021-09-20 DIAGNOSIS — H538 Other visual disturbances: Secondary | ICD-10-CM | POA: Diagnosis not present

## 2021-09-20 LAB — URINALYSIS, ROUTINE W REFLEX MICROSCOPIC
Bilirubin Urine: NEGATIVE
Glucose, UA: NEGATIVE mg/dL
Ketones, ur: 20 mg/dL — AB
Leukocytes,Ua: NEGATIVE
Nitrite: NEGATIVE
Protein, ur: 100 mg/dL — AB
Specific Gravity, Urine: 1.018 (ref 1.005–1.030)
pH: 5 (ref 5.0–8.0)

## 2021-09-20 MED ORDER — KETOROLAC TROMETHAMINE 15 MG/ML IJ SOLN
15.0000 mg | Freq: Once | INTRAMUSCULAR | Status: AC
  Administered 2021-09-20: 15 mg via INTRAVENOUS
  Filled 2021-09-20: qty 1

## 2021-09-20 MED ORDER — GABAPENTIN 300 MG PO CAPS
600.0000 mg | ORAL_CAPSULE | Freq: Once | ORAL | Status: AC
Start: 2021-09-20 — End: 2021-09-20
  Administered 2021-09-20: 600 mg via ORAL
  Filled 2021-09-20: qty 2

## 2021-09-20 MED ORDER — LORAZEPAM 2 MG/ML IJ SOLN
1.0000 mg | Freq: Once | INTRAMUSCULAR | Status: AC | PRN
  Administered 2021-09-20: 1 mg via INTRAVENOUS
  Filled 2021-09-20: qty 1

## 2021-09-20 NOTE — ED Notes (Signed)
Pt to MRI

## 2021-09-20 NOTE — ED Notes (Signed)
Pt eating. EDP at [facility].

## 2021-09-20 NOTE — ED Notes (Signed)
Visual Screening  Left Eye: 20/200  Right Eye: 20/70;   20/50 could see 2 out of 4 letters correctly

## 2021-09-20 NOTE — Discharge Instructions (Addendum)
An appointment has been made tomorrow at 51 AM at the eye specialist - please show up at the office 15 minutes before your appointment.  If you experience any new symptoms like facial droop, difficulty speaking, severe headache, or numbness or weakness in your arms and legs, please come back to the ER immediately.  These may be signs or symptoms of a stroke.  Your MRI of the brain did not show signs of a stroke at this time.  You need to be seen by an eye specialist.

## 2021-09-20 NOTE — ED Provider Notes (Signed)
Assumed care of patient from Dr Roxanne Mins EDP.  Briefly this is a 67 yo male who presents with transient painless loss of vision in left eye yesterday evening approx 10 PM, vision has returned but diminished, blurred vision.  Left eye vision to fingers only, right eye 20/50.  CTH shows no acute CVA  Pending MRI brain for stroke evaluation  *  MRI brain and orbits personally reviewed and interpreted, agree with radiology interpretation no acute infarct.  There is some very minor artifact related to the craniectomy in the past, but I do not believe this is significantly distorting the images that are preventing adequate views.  There was some mild motion degraded artifact as well.  The patient remains at baseline with painless diffuse blurred vision in left eye.  I spoke to Dr. Wyatt Portela from ophthalmology by phone, and are able to establish a follow-up visit tomorrow morning in the office at 9:30 AM.  The patient otherwise cannot make it to the office today.  He will follow-up in their clinic for ocular exam.   Wyvonnia Dusky, MD 09/20/21 1046

## 2021-09-20 NOTE — ED Notes (Signed)
Back from MRI.

## 2021-09-20 NOTE — ED Notes (Signed)
Alert, NAD, calm, interactive, resps e/u, speaking in clear complete sentences, reports feeling better, decreased pain, denies questions or needs, sx or complaints.

## 2021-10-04 ENCOUNTER — Telehealth: Payer: Self-pay | Admitting: Neurology

## 2021-10-04 ENCOUNTER — Ambulatory Visit: Payer: Medicaid Other | Admitting: Neurology
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
## 2021-10-05 ENCOUNTER — Telehealth: Payer: Self-pay | Admitting: Family Medicine

## 2021-10-10 NOTE — Addendum Note (Signed)
Addended by: Gwenlyn Perking on: 10/10/2021 01:18 PM   Modules accepted: Orders

## 2021-10-10 NOTE — Telephone Encounter (Signed)
Yes. I will add to his last visit.

## 2021-10-11 NOTE — Telephone Encounter (Signed)
Please send rx for bariatric wheelchair [employer] [facility] in [place]

## 2021-10-11 NOTE — Telephone Encounter (Signed)
Daughter Edwin Snyder will come by to pick up the rx and take to [facility] in [place] along with progress report. I asked if that was the office note and daughter asked for nurse to call to make sure if that is what is needed. Hulan Fray number is  [phone]. Please call Edwin Snyder when ready to pick up.

## 2021-10-11 NOTE — Telephone Encounter (Signed)
Documents printed & placed at front desk to pickup

## 2021-10-11 NOTE — Telephone Encounter (Signed)
DME order sent via Epic to [employer]

## 2021-10-12 ENCOUNTER — Encounter: Payer: Self-pay | Admitting: Physical Medicine & Rehabilitation

## 2021-10-12 ENCOUNTER — Encounter: Payer: Medicare Other | Attending: Physical Medicine & Rehabilitation | Admitting: Physical Medicine & Rehabilitation

## 2021-10-12 ENCOUNTER — Telehealth: Payer: Self-pay | Admitting: Family Medicine

## 2021-10-12 VITALS — BP 131/84 | HR 98 | Ht 73.0 in | Wt 370.0 lb

## 2021-10-12 DIAGNOSIS — G6289 Other specified polyneuropathies: Secondary | ICD-10-CM | POA: Diagnosis present

## 2021-10-12 DIAGNOSIS — M545 Low back pain, unspecified: Secondary | ICD-10-CM | POA: Insufficient documentation

## 2021-10-12 DIAGNOSIS — F0630 Mood disorder due to known physiological condition, unspecified: Secondary | ICD-10-CM | POA: Diagnosis present

## 2021-10-12 DIAGNOSIS — G8929 Other chronic pain: Secondary | ICD-10-CM | POA: Diagnosis present

## 2021-10-12 MED ORDER — DULOXETINE HCL 30 MG PO CPEP: 30.0000 mg | ORAL_CAPSULE | Freq: Every day | ORAL | 3 refills | Status: DC
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
## 2021-10-12 NOTE — Telephone Encounter (Signed)
Pharmacy updated to [facility]

## 2021-10-15 ENCOUNTER — Other Ambulatory Visit: Payer: Self-pay

## 2021-10-15 DIAGNOSIS — Z789 Other specified health status: Secondary | ICD-10-CM

## 2021-10-15 NOTE — Telephone Encounter (Signed)
RX for wheelchair has to specify that he needs a bariactric wheelchair and needs height and weight. F# [phone] to [facility] before 1:00 today.

## 2021-10-15 NOTE — Telephone Encounter (Signed)
Rewrote order for bariatric wheelchair with additional information and faxed to [facility]. Daughter notified and verbalized understanding.

## 2021-10-17 ENCOUNTER — Ambulatory Visit (INDEPENDENT_AMBULATORY_CARE_PROVIDER_SITE_OTHER): Payer: Medicare Other

## 2021-10-17 VITALS — Wt 370.0 lb

## 2021-10-17 DIAGNOSIS — Z0000 Encounter for general adult medical examination without abnormal findings: Secondary | ICD-10-CM | POA: Diagnosis not present

## 2021-10-17 NOTE — Progress Notes (Signed)
Subjective:   Edwin Snyder is a 66 y.o. male who presents for an Initial Medicare Annual Wellness Visit.  Virtual Visit via Telephone Note  I connected with  Edwin Snyder on 10/17/21 at  8:15 AM EDT by telephone and verified that I am speaking with the correct person using two identifiers.  Location: Patient: Home Provider: [facility] Persons participating in the virtual visit: patient/Nurse Health Advisor   I discussed the limitations, risks, security and privacy concerns of performing an evaluation and management service by telephone and the availability of in person appointments. The patient expressed understanding and agreed to proceed.  Interactive audio and video telecommunications were attempted between this nurse and patient, however failed, due to patient having technical difficulties OR patient did not have access to video capability.  We continued and completed visit with audio only.  Some vital signs may be absent or patient reported.   [name] E [name], LPN   Review of Systems     Cardiac Risk Factors include: advanced age (>53mn, >>73women);dyslipidemia;hypertension;sedentary lifestyle;obesity (BMI >30kg/m2);Other (see comment), Risk factor comments: malnutrition     Objective:    Today's Vitals   10/17/21 0820  Weight: (!) 370 lb (167.8 kg)  PainSc: 6    Body mass index is 48.82 kg/m.     10/17/2021    8:44 AM 09/19/2021    7:46 PM 06/13/2021   12:10 PM 06/12/2021   12:47 PM 06/03/2021   12:30 AM 06/02/2021    7:41 PM 05/08/2018   11:57 AM  Advanced Directives  Does Patient Have a Medical Advance Directive? Yes No Yes No  No No  Type of AParamed[place]of [place] will  [place]     Does patient want to make changes to medical advance directive?   No - Patient declined      Copy of [place] Chart? No - copy requested  No - copy requested      Would patient like information on creating a  medical advance directive?   No - Patient declined  No - Patient declined No - Patient declined No - Patient declined    Current Medications (verified) Outpatient Encounter Medications as of 10/17/2021  Medication Sig   albuterol (VENTOLIN HFA) 108 (90 Base) MCG/ACT inhaler Inhale 2 puffs into the lungs every 4 (four) hours as needed for wheezing or shortness of breath.   diclofenac Sodium (VOLTAREN) 1 % GEL Apply 2 g topically 4 (four) times daily as needed (pain).   DULoxetine (CYMBALTA) 30 MG capsule Take 1 capsule (30 mg total) by mouth daily.   ezetimibe (ZETIA) 10 MG tablet Take 1 tablet (10 mg total) by mouth daily.   folic acid (FOLVITE) 1 MG tablet Take 1 tablet (1 mg total) by mouth daily.   gabapentin (NEURONTIN) 600 MG tablet Take 1 tablet (600 mg total) by mouth 2 (two) times daily.   ipratropium-albuterol (DUONEB) 0.5-2.5 (3) MG/3ML SOLN Take 3 mLs by nebulization every 6 (six) hours as needed (SOB).   magnesium oxide (MAG-OX) 400 (240 Mg) MG tablet Take 1 tablet (400 mg total) by mouth 2 (two) times daily.   melatonin 1 MG TABS tablet Take 1 mg by mouth at bedtime.   midodrine (PROAMATINE) 5 MG tablet Take 1 tablet (5 mg total) by mouth 3 (three) times daily.   Multiple Vitamin (MULTIVITAMIN) tablet Take 1 tablet by mouth daily.   omeprazole (PRILOSEC) 40 MG capsule Take  1 capsule (40 mg total) by mouth daily.   QUEtiapine (SEROQUEL) 25 MG tablet Take 1 tablet (25 mg total) by mouth at bedtime.   senna-docusate (SENOKOT-S) 8.6-50 MG tablet Take 1 tablet by mouth 2 (two) times daily.   simvastatin (ZOCOR) 40 MG tablet Take 1 tablet (40 mg total) by mouth daily.   thiamine 100 MG tablet Take 1 tablet (100 mg total) by mouth daily.   vitamin B-12 (CYANOCOBALAMIN) 1000 MCG tablet Take 1 tablet (1,000 mcg total) by mouth daily.   No facility-administered encounter medications on file as of 10/17/2021.    Allergies (verified) Clindamycin/lincomycin, Penicillins, and Ivp dye  [iodinated contrast media]   History: Past Medical History:  Diagnosis Date   Anxiety    Arthritis    Blood transfusion without reported diagnosis    Brain tumor ([place])    CAD (coronary artery disease)    COPD (chronic obstructive pulmonary disease) ([place])    Depression    Hyperlipidemia    Obesity    Seizures ([place])    Sleep apnea    Stroke [facility])    Past Surgical History:  Procedure Laterality Date   BRAIN SURGERY     FOOT SURGERY     SKIN LESION EXCISION     History reviewed. No pertinent family history. Social History   Socioeconomic History   Marital status: Divorced    Spouse name: Not on file   Number of children: 2   Years of education: Not on file   Highest education level: Not on file  Occupational History   Occupation: retired  Tobacco Use   Smoking status: Never   Smokeless tobacco: Never  Substance and Sexual Activity   Alcohol use: No   Drug use: No   Sexual activity: Not Currently  Other Topics Concern   Not on file  Social History Narrative   Not on file   Social Determinants of Health   Financial Resource Strain: Low Risk  (10/17/2021)   Overall Financial Resource Strain (CARDIA)    Difficulty of Paying Living Expenses: Not hard at all  Food Insecurity: No Food Insecurity (10/17/2021)   Hunger Vital Sign    Worried About Running Out of Food in the Last Year: Never true    Ran Out of Food in the Last Year: Never true  Transportation Needs: Unmet Transportation Needs (10/17/2021)   PRAPARE - Hydrologist (Medical): Yes    Lack of Transportation (Non-Medical): No  Physical Activity: Inactive (10/17/2021)   Exercise Vital Sign    Days of Exercise per Week: 0 days    Minutes of Exercise per Session: 0 min  Stress: No Stress Concern Present (10/17/2021)   [place]    Feeling of Stress : Not at all  Social Connections: Socially Isolated (10/17/2021)    Social Connection and Isolation Panel [NHANES]    Frequency of Communication with Friends and Family: More than three times a week    Frequency of Social Gatherings with Friends and Family: More than three times a week    Attends Religious Services: Never    Marine scientist or Organizations: No    Attends Music therapist: Never    Marital Status: Divorced    Tobacco Counseling Counseling given: Not Answered   Clinical Intake:  Pre-visit preparation completed: Yes  Pain : 0-10 Pain Score: 6  Pain Type: Chronic pain Pain Location: Leg Pain Orientation: Right,  Left Pain Descriptors / Indicators: Aching, Shooting, Tender, Throbbing Pain Onset: More than a month ago Pain Frequency: Intermittent     BMI - recorded: 48.82 Nutritional Status: BMI > 30  Obese Nutritional Risks: None Diabetes: No  How often do you need to have someone help you when you read instructions, pamphlets, or other written materials from your doctor or pharmacy?: 1 - Never  Diabetic? no  Interpreter Needed?: No  Information entered by :: Tommye Lehenbauer, LPN   Activities of Daily Living    10/17/2021    8:22 AM 06/13/2021   12:10 PM  In your present state of health, do you have any difficulty performing the following activities:  Hearing? 1 0  Comment wears hearing aids   Vision? 1 0  Difficulty concentrating or making decisions? 0 1  Walking or climbing stairs? 1 1  Dressing or bathing? 1 1  Doing errands, shopping? 1 1  Preparing Food and eating ? N   Using the Toilet? N   In the past six months, have you accidently leaked urine? N   Do you have problems with loss of bowel control? N   Managing your Medications? N   Managing your Finances? N   Housekeeping or managing your Housekeeping? Y     Patient Care Team: Gwenlyn Perking, FNP as PCP - General (Family Medicine)  Indicate any recent Medical Services you may have received from other than [facility] providers in the  past year (date may be approximate).     Assessment:   This is a routine wellness examination for [place].  Hearing/Vision screen Hearing Screening - Comments:: Wears hearing aids - no ENT or audiologist - got these from insurance company he thinks Vision Screening - Comments:: Wears rx glasses - up to date with routine eye exams with [facility]  Dietary issues and exercise activities discussed: Current Exercise Habits: The patient does not participate in regular exercise at present, Exercise limited by: orthopedic condition(s);neurologic condition(s)   Goals Addressed             This Visit's Progress    Patient Stated       10/17/2021 - would like to lose weight and be able to be more independent       Depression Screen    10/17/2021    8:28 AM 10/12/2021    9:50 AM 09/06/2021    3:01 PM  PHQ 2/9 Scores  PHQ - 2 Score 0 6 2  PHQ- 9 Score '4 23 4    '$ Fall Risk    10/17/2021    8:26 AM 10/12/2021    9:49 AM 09/06/2021    3:01 PM  Fall Risk   Falls in the past year? 1 1 0  Number falls in past yr: 1 1   Comment  reports probably 8-10 last one around Aug 27 2021   Injury with Fall? 1 1   Risk for fall due to : History of fall(s);Impaired balance/gait;Orthopedic patient;Impaired mobility;Impaired vision Impaired balance/gait;Impaired mobility;Impaired vision;History of fall(s)   Follow up Education provided;Falls prevention discussed      FALL RISK PREVENTION PERTAINING TO THE HOME:  Any stairs in or around the home? No  If so, are there any without handrails? No  Home free of loose throw rugs in walkways, pet beds, electrical cords, etc? Yes  Adequate lighting in your home to reduce risk of falls? Yes   ASSISTIVE DEVICES UTILIZED TO PREVENT FALLS:  Life alert? No  Use of  a cane, walker or w/c? Yes  Grab bars in the bathroom? Yes  Shower chair or bench in shower? Yes  Elevated toilet seat or a handicapped toilet? No   TIMED UP AND GO:  Was the test  performed? No . Telephonic visit  Cognitive Function:        10/17/2021    8:31 AM  6CIT Screen  What Year? 0 points  What month? 3 points  What time? 0 points  Count back from 20 0 points  Months in reverse 4 points  Repeat phrase 0 points  Total Score 7 points    Immunizations Immunization History  Administered Date(s) Administered   Influenza, High Dose Seasonal PF 05/08/2021   Tdap 12/14/2012    TDAP status: Up to date  Flu Vaccine status: Up to date  Pneumococcal vaccine status: Due, Education has been provided regarding the importance of this vaccine. Advised may receive this vaccine at local pharmacy or Health Dept. Aware to provide a copy of the vaccination record if obtained from local pharmacy or Health Dept. Verbalized acceptance and understanding.  Covid-19 vaccine status: Declined, Education has been provided regarding the importance of this vaccine but patient still declined. Advised may receive this vaccine at local pharmacy or Health Dept.or vaccine clinic. Aware to provide a copy of the vaccination record if obtained from local pharmacy or Health Dept. Verbalized acceptance and understanding.  Qualifies for Shingles Vaccine? Yes   Zostavax completed No   Shingrix Completed?: No.    Education has been provided regarding the importance of this vaccine. Patient has been advised to call insurance company to determine out of pocket expense if they have not yet received this vaccine. Advised may also receive vaccine at local pharmacy or Health Dept. Verbalized acceptance and understanding.  Screening Tests Health Maintenance  Topic Date Due   COVID-19 Vaccine (1) Never done   Hepatitis C Screening  Never done   COLONOSCOPY (Pts 45-76yr Insurance coverage will need to be confirmed)  Never done   Zoster Vaccines- Shingrix (1 of 2) Never done   Pneumonia Vaccine 68 Years old (1 - PCV) Never done   INFLUENZA VACCINE  11/27/2021   TETANUS/TDAP  12/15/2022   HPV  VACCINES  Aged Out    Health Maintenance  Health Maintenance Due  Topic Date Due   COVID-19 Vaccine (1) Never done   Hepatitis C Screening  Never done   COLONOSCOPY (Pts 45-451yrInsurance coverage will need to be confirmed)  Never done   Zoster Vaccines- Shingrix (1 of 2) Never done   Pneumonia Vaccine 6585Years old (1 - PCV) Never done    Colorectal cancer screening: Type of screening: [facility]. [facility]  Lung Cancer Screening: (Low Dose CT Chest recommended if Age 68-80ears, 30 pack-year currently smoking OR have quit w/in 15years.) does not qualify.   Additional Screening:  Hepatitis C Screening: does qualify; DUE  Vision Screening: Recommended annual ophthalmology exams for early detection of glaucoma and other disorders of the eye. Is the patient up to date with their annual eye exam?  Yes  Who is the provider or what is the name of the office in which the patient attends annual eye exams? [facility] is not established with a provider, would they like to be referred to a provider to establish care? No .   Dental Screening: Recommended annual dental exams for proper oral hygiene  [facility] [employer]  Referral / Chronic Care Management: CRR required this visit?  No   CCM required this visit?  No      Plan:     I have personally reviewed and noted the following in the patient's chart:   Medical and social history Use of alcohol, tobacco or illicit drugs  Current medications and supplements including opioid prescriptions. Patient is not currently taking opioid prescriptions. Functional ability and status Nutritional status Physical activity Advanced directives List of other physicians Hospitalizations, surgeries, and ER visits in previous 12 months Vitals Screenings to include cognitive, depression, and falls Referrals and appointments  In addition, I have reviewed and discussed with  patient certain preventive protocols, quality metrics, and best practice recommendations. A written personalized care plan for preventive services as well as general preventive health recommendations were provided to patient.     Sandrea Hammond, LPN   06/20/9796   Nurse Notes: Patient declined referrals. He says he doesn't have reliable transportation all the time - he is too obese and weak to get in home PT, because it takes at least 2 therapist to help him - he is too big of a fall risk - we discussed importance of losing weight to be able to get more assistance to get him back to being independent and active.

## 2021-10-17 NOTE — Patient Instructions (Signed)
Edwin Snyder , Thank you for taking time to come for your Medicare Wellness Visit. I appreciate your ongoing commitment to your health goals. Please review the following plan we discussed and let me know if I can assist you in the future.   Screening recommendations/referrals: Colonoscopy: Done around 2020 - Repeat in 5 years  Recommended yearly ophthalmology/optometry visit for glaucoma screening and checkup Recommended yearly dental visit for hygiene and checkup  Vaccinations: Influenza vaccine: Done 05/08/2021 - repeat every fall Pneumococcal vaccine: Due - recommend once per lifetime Prevnar-20 Tdap vaccine: Done 12/14/2012 - Repeat in 10 years  Shingles vaccine: Due - Shingrix is 2 doses 2-6 months apart and over 90% effective     Covid-19: one done - unknown date - for boosters, contact pharmacy  Advanced directives: Please bring a copy of your health care power of attorney and living will to the office to be added to your chart at your convenience.   Conditions/risks identified: Aim for 4-6 glasses of water daily, plenty of protein in your diet and try to get up and walk/ stretch every hour for 5-10 minutes at a time. Limit your portion sizes and try to eat mostly vegetables and lean meats to get your weight down so you can be more active and independent. Try the balance and strength exercises included in this packet, and review the diet tips at the end of this summary.  Next appointment: Follow up in one year for your annual wellness visit.   Preventive Care 39 Years and Older, Male  Preventive care refers to lifestyle choices and visits with your health care provider that can promote health and wellness. What does preventive care include? A yearly physical exam. This is also called an annual well check. Dental exams once or twice a year. Routine eye exams. Ask your health care provider how often you should have your eyes checked. Personal lifestyle choices, including: Daily care of  your teeth and gums. Regular physical activity. Eating a healthy diet. Avoiding tobacco and drug use. Limiting alcohol use. Practicing safe sex. Taking low doses of aspirin every day. Taking vitamin and mineral supplements as recommended by your health care provider. What happens during an annual well check? The services and screenings done by your health care provider during your annual well check will depend on your age, overall health, lifestyle risk factors, and family history of disease. Counseling  Your health care provider may ask you questions about your: Alcohol use. Tobacco use. Drug use. Emotional well-being. Home and relationship well-being. Sexual activity. Eating habits. History of falls. Memory and ability to understand (cognition). Work and work Statistician. Screening  You may have the following tests or measurements: Height, weight, and BMI. Blood pressure. Lipid and cholesterol levels. These may be checked every 5 years, or more frequently if you are over 15 years old. Skin check. Lung cancer screening. You may have this screening every year starting at age 30 if you have a 30-pack-year history of smoking and currently smoke or have quit within the past 15 years. Fecal occult blood test (FOBT) of the stool. You may have this test every year starting at age 69. Flexible sigmoidoscopy or colonoscopy. You may have a sigmoidoscopy every 5 years or a colonoscopy every 10 years starting at age 3. Prostate cancer screening. Recommendations will vary depending on your family history and other risks. Hepatitis C blood test. Hepatitis B blood test. Sexually transmitted disease (STD) testing. Diabetes screening. This is done by checking your blood  sugar (glucose) after you have not eaten for a while (fasting). You may have this done every 1-3 years. Abdominal aortic aneurysm (AAA) screening. You may need this if you are a current or former smoker. Osteoporosis. You may be  screened starting at age 64 if you are at high risk. Talk with your health care provider about your test results, treatment options, and if necessary, the need for more tests. Vaccines  Your health care provider may recommend certain vaccines, such as: Influenza vaccine. This is recommended every year. Tetanus, diphtheria, and acellular pertussis (Tdap, Td) vaccine. You may need a Td booster every 10 years. Zoster vaccine. You may need this after age 68. Pneumococcal 13-valent conjugate (PCV13) vaccine. One dose is recommended after age 46. Pneumococcal polysaccharide (PPSV23) vaccine. One dose is recommended after age 46. Talk to your health care provider about which screenings and vaccines you need and how often you need them. This information is not intended to replace advice given to you by your health care provider. Make sure you discuss any questions you have with your health care provider. Document Released: 05/12/2015 Document Revised: 01/03/2016 Document Reviewed: 02/14/2015 Elsevier Interactive Patient Education  2017 [place] Prevention in the Home Falls can cause injuries. They can happen to people of all ages. There are many things you can do to make your home safe and to help prevent falls. What can I do on the outside of my home? Regularly fix the edges of walkways and driveways and fix any cracks. Remove anything that might make you trip as you walk through a door, such as a raised step or threshold. Trim any bushes or trees on the path to your home. Use bright outdoor lighting. Clear any walking paths of anything that might make someone trip, such as rocks or tools. Regularly check to see if handrails are loose or broken. Make sure that both sides of any steps have handrails. Any raised decks and porches should have guardrails on the edges. Have any leaves, snow, or ice cleared regularly. Use sand or salt on walking paths during winter. Clean up any spills in  your garage right away. This includes oil or grease spills. What can I do in the bathroom? Use night lights. Install grab bars by the toilet and in the tub and shower. Do not use towel bars as grab bars. Use non-skid mats or decals in the tub or shower. If you need to sit down in the shower, use a plastic, non-slip stool. Keep the floor dry. Clean up any water that spills on the floor as soon as it happens. Remove soap buildup in the tub or shower regularly. Attach bath mats securely with double-sided non-slip rug tape. Do not have throw rugs and other things on the floor that can make you trip. What can I do in the bedroom? Use night lights. Make sure that you have a light by your bed that is easy to reach. Do not use any sheets or blankets that are too big for your bed. They should not hang down onto the floor. Have a firm chair that has side arms. You can use this for support while you get dressed. Do not have throw rugs and other things on the floor that can make you trip. What can I do in the kitchen? Clean up any spills right away. Avoid walking on wet floors. Keep items that you use a lot in easy-to-reach places. If you need to reach something above you,  use a strong step stool that has a grab bar. Keep electrical cords out of the way. Do not use floor polish or wax that makes floors slippery. If you must use wax, use non-skid floor wax. Do not have throw rugs and other things on the floor that can make you trip. What can I do with my stairs? Do not leave any items on the stairs. Make sure that there are handrails on both sides of the stairs and use them. Fix handrails that are broken or loose. Make sure that handrails are as long as the stairways. Check any carpeting to make sure that it is firmly attached to the stairs. Fix any carpet that is loose or worn. Avoid having throw rugs at the top or bottom of the stairs. If you do have throw rugs, attach them to the floor with carpet  tape. Make sure that you have a light switch at the top of the stairs and the bottom of the stairs. If you do not have them, ask someone to add them for you. What else can I do to help prevent falls? Wear shoes that: Do not have high heels. Have rubber bottoms. Are comfortable and fit you well. Are closed at the toe. Do not wear sandals. If you use a stepladder: Make sure that it is fully opened. Do not climb a closed stepladder. Make sure that both sides of the stepladder are locked into place. Ask someone to hold it for you, if possible. Clearly mark and make sure that you can see: Any grab bars or handrails. First and last steps. Where the edge of each step is. Use tools that help you move around (mobility aids) if they are needed. These include: Canes. Walkers. Scooters. Crutches. Turn on the lights when you go into a dark area. Replace any light bulbs as soon as they burn out. Set up your furniture so you have a clear path. Avoid moving your furniture around. If any of your floors are uneven, fix them. If there are any pets around you, be aware of where they are. Review your medicines with your doctor. Some medicines can make you feel dizzy. This can increase your chance of falling. Ask your doctor what other things that you can do to help prevent falls. This information is not intended to replace advice given to you by your health care provider. Make sure you discuss any questions you have with your health care provider. Document Released: 02/09/2009 Document Revised: 09/21/2015 Document Reviewed: 05/20/2014 Elsevier Interactive Patient Education  2017 [place] for [employer] Calories are units of energy. Your body needs a certain number of calories from food to keep going throughout the day. When you eat or drink more calories than your body needs, your body stores the extra calories mostly as fat. When you eat or drink fewer calories than your body  needs, your body burns fat to get the energy it needs. Calorie counting means keeping track of how many calories you eat and drink each day. Calorie counting can be helpful if you need to lose weight. If you eat fewer calories than your body needs, you should lose weight. Ask your health care provider what a healthy weight is for you. For calorie counting to work, you will need to eat the right number of calories each day to lose a healthy amount of weight per week. A dietitian can help you figure out how many calories you need in a day  and will suggest ways to reach your calorie goal. A healthy amount of weight to lose each week is usually 1-2 lb (0.5-0.9 kg). This usually means that your daily calorie intake should be reduced by 500-750 calories. Eating 1,200-1,500 calories a day can help most women lose weight. Eating 1,500-1,800 calories a day can help most men lose weight. What do I need to know about calorie counting? Work with your health care provider or dietitian to determine how many calories you should get each day. To meet your daily calorie goal, you will need to: Find out how many calories are in each food that you would like to eat. Try to do this before you eat. Decide how much of the food you plan to eat. Keep a food log. Do this by writing down what you ate and how many calories it had. To successfully lose weight, it is important to balance calorie counting with a healthy lifestyle that includes regular activity. Where do I find calorie information?  The number of calories in a food can be found on a Nutrition Facts label. If a food does not have a Nutrition Facts label, try to look up the calories online or ask your dietitian for help. Remember that calories are listed per serving. If you choose to have more than one serving of a food, you will have to multiply the calories per serving by the number of servings you plan to eat. For example, the label on a package of bread might say  that a serving size is 1 slice and that there are 90 calories in a serving. If you eat 1 slice, you will have eaten 90 calories. If you eat 2 slices, you will have eaten 180 calories. How do I keep a food log? After each time that you eat, record the following in your food log as soon as possible: What you ate. Be sure to include toppings, sauces, and other extras on the food. How much you ate. This can be measured in cups, ounces, or number of items. How many calories were in each food and drink. The total number of calories in the food you ate. Keep your food log near you, such as in a pocket-sized notebook or on an app or website on your mobile phone. Some programs will calculate calories for you and show you how many calories you have left to meet your daily goal. What are some portion-control tips? Know how many calories are in a serving. This will help you know how many servings you can have of a certain food. Use a measuring cup to measure serving sizes. You could also try weighing out portions on a kitchen scale. With time, you will be able to estimate serving sizes for some foods. Take time to put servings of different foods on your favorite plates or in your favorite bowls and cups so you know what a serving looks like. Try not to eat straight from a food's packaging, such as from a bag or box. Eating straight from the package makes it hard to see how much you are eating and can lead to overeating. Put the amount you would like to eat in a cup or on a plate to make sure you are eating the right portion. Use smaller plates, glasses, and bowls for smaller portions and to prevent overeating. Try not to multitask. For example, avoid watching TV or using your computer while eating. If it is time to eat, sit down at a table  and enjoy your food. This will help you recognize when you are full. It will also help you be more mindful of what and how much you are eating. What are tips for following this  plan? Reading food labels Check the calorie count compared with the serving size. The serving size may be smaller than what you are used to eating. Check the source of the calories. Try to choose foods that are high in protein, fiber, and vitamins, and low in saturated fat, trans fat, and sodium. Shopping Read nutrition labels while you shop. This will help you make healthy decisions about which foods to buy. Pay attention to nutrition labels for low-fat or fat-free foods. These foods sometimes have the same number of calories or more calories than the full-fat versions. They also often have added sugar, starch, or salt to make up for flavor that was removed with the fat. Make a grocery list of lower-calorie foods and stick to it. Cooking Try to cook your favorite foods in a healthier way. For example, try baking instead of frying. Use low-fat dairy products. Meal planning Use more fruits and vegetables. One-half of your plate should be fruits and vegetables. Include lean proteins, such as chicken, [place], and fish. Lifestyle Each week, aim to do one of the following: 150 minutes of moderate exercise, such as walking. 75 minutes of vigorous exercise, such as running. General information Know how many calories are in the foods you eat most often. This will help you calculate calorie counts faster. Find a way of tracking calories that works for you. Get creative. Try different apps or programs if writing down calories does not work for you. What foods should I eat?  Eat nutritious foods. It is better to have a nutritious, high-calorie food, such as an avocado, than a food with few nutrients, such as a bag of potato chips. Use your calories on foods and drinks that will fill you up and will not leave you hungry soon after eating. Examples of foods that fill you up are nuts and nut butters, vegetables, lean proteins, and high-fiber foods such as whole grains. High-fiber foods are foods with more  than 5 g of fiber per serving. Pay attention to calories in drinks. Low-calorie drinks include water and unsweetened drinks. The items listed above may not be a complete list of foods and beverages you can eat. Contact a dietitian for more information. What foods should I limit? Limit foods or drinks that are not good sources of vitamins, minerals, or protein or that are high in unhealthy fats. These include: Candy. Other sweets. Sodas, specialty coffee drinks, alcohol, and juice. The items listed above may not be a complete list of foods and beverages you should avoid. Contact a dietitian for more information. How do I count calories when eating out? Pay attention to portions. Often, portions are much larger when eating out. Try these tips to keep portions smaller: Consider sharing a meal instead of getting your own. If you get your own meal, eat only half of it. Before you start eating, ask for a container and put half of your meal into it. When available, consider ordering smaller portions from the menu instead of full portions. Pay attention to your food and drink choices. Knowing the way food is cooked and what is included with the meal can help you eat fewer calories. If calories are listed on the menu, choose the lower-calorie options. Choose dishes that include vegetables, fruits, whole grains, low-fat dairy products,  and lean proteins. Choose items that are boiled, broiled, grilled, or steamed. Avoid items that are buttered, battered, fried, or served with cream sauce. Items labeled as crispy are usually fried, unless stated otherwise. Choose water, low-fat milk, unsweetened iced tea, or other drinks without added sugar. If you want an alcoholic beverage, choose a lower-calorie option, such as a glass of wine or light beer. Ask for dressings, sauces, and syrups on the side. These are usually high in calories, so you should limit the amount you eat. If you want a salad, choose a garden  salad and ask for grilled meats. Avoid extra toppings such as bacon, cheese, or fried items. Ask for the dressing on the side, or ask for olive oil and vinegar or lemon to use as dressing. Estimate how many servings of a food you are given. Knowing serving sizes will help you be aware of how much food you are eating at restaurants. Where to find more information Centers for Disease Control and Prevention: http://www.wolf.info/ U.S. Department of Agriculture: http://www.wilson-mendoza.org/ Summary Calorie counting means keeping track of how many calories you eat and drink each day. If you eat fewer calories than your body needs, you should lose weight. A healthy amount of weight to lose per week is usually 1-2 lb (0.5-0.9 kg). This usually means reducing your daily calorie intake by 500-750 calories. The number of calories in a food can be found on a Nutrition Facts label. If a food does not have a Nutrition Facts label, try to look up the calories online or ask your dietitian for help. Use smaller plates, glasses, and bowls for smaller portions and to prevent overeating. Use your calories on foods and drinks that will fill you up and not leave you hungry shortly after a meal. This information is not intended to replace advice given to you by your health care provider. Make sure you discuss any questions you have with your health care provider. Document Revised: 05/27/2019 Document Reviewed: 05/27/2019 Elsevier Patient Education  [place].

## 2021-10-25 ENCOUNTER — Encounter: Payer: Self-pay | Admitting: Neurology

## 2021-10-25 ENCOUNTER — Ambulatory Visit: Payer: Medicaid Other | Admitting: Neurology

## 2021-11-07 ENCOUNTER — Telehealth: Payer: Self-pay

## 2021-11-07 MED ORDER — DULOXETINE HCL 30 MG PO CPEP: 30.0000 mg | ORAL_CAPSULE | Freq: Every day | ORAL | 0 refills | Status: DC

## 2021-11-07 MED ORDER — DULOXETINE HCL 30 MG PO CPEP
30.0000 mg | ORAL_CAPSULE | Freq: Every day | ORAL | 3 refills | Status: DC
Start: 2021-11-07 — End: 2021-11-07

## 2021-11-07 NOTE — Telephone Encounter (Signed)
Refill for Duloxetine sent to [facility]

## 2021-11-27 ENCOUNTER — Encounter: Payer: Medicare Other | Attending: Physical Medicine & Rehabilitation | Admitting: Physical Medicine & Rehabilitation

## 2021-11-27 DIAGNOSIS — F0630 Mood disorder due to known physiological condition, unspecified: Secondary | ICD-10-CM | POA: Insufficient documentation

## 2021-11-27 DIAGNOSIS — M545 Low back pain, unspecified: Secondary | ICD-10-CM | POA: Insufficient documentation

## 2021-11-27 DIAGNOSIS — G6289 Other specified polyneuropathies: Secondary | ICD-10-CM | POA: Insufficient documentation

## 2021-11-27 DIAGNOSIS — G8929 Other chronic pain: Secondary | ICD-10-CM | POA: Insufficient documentation

## 2021-12-03 ENCOUNTER — Telehealth: Payer: Self-pay | Admitting: Family Medicine

## 2021-12-05 ENCOUNTER — Ambulatory Visit (INDEPENDENT_AMBULATORY_CARE_PROVIDER_SITE_OTHER): Payer: Medicare Other | Admitting: Neurology

## 2021-12-05 ENCOUNTER — Telehealth: Payer: Self-pay | Admitting: Neurology

## 2021-12-05 ENCOUNTER — Encounter: Payer: Self-pay | Admitting: Neurology

## 2021-12-05 VITALS — BP 145/97 | HR 87

## 2021-12-05 DIAGNOSIS — R030 Elevated blood-pressure reading, without diagnosis of hypertension: Secondary | ICD-10-CM | POA: Diagnosis not present

## 2021-12-05 DIAGNOSIS — R5381 Other malaise: Secondary | ICD-10-CM | POA: Diagnosis not present

## 2021-12-05 DIAGNOSIS — R269 Unspecified abnormalities of gait and mobility: Secondary | ICD-10-CM

## 2021-12-05 DIAGNOSIS — G629 Polyneuropathy, unspecified: Secondary | ICD-10-CM | POA: Diagnosis not present

## 2021-12-05 MED ORDER — PREGABALIN 150 MG PO CAPS: 150.0000 mg | ORAL_CAPSULE | Freq: Two times a day (BID) | ORAL | 3 refills | Status: DC

## 2021-12-05 NOTE — Telephone Encounter (Signed)
[facility] then for [facility] referral

## 2021-12-05 NOTE — Telephone Encounter (Signed)
Pt's daughter, Azarius Lambson called [facility]ian[facility] Lei just pick pt up for his appt at your office. He may get on time or 5 minutes late. Informed her he may have to reschedule his appt  if get her too late.

## 2021-12-05 NOTE — Progress Notes (Signed)
[facility]  PATIENT: Edwin Snyder DOB: 06-Aug-1955  REQUESTING CLINICIAN: Gwenlyn Perking, FNP HISTORY FROM: Patient  REASON FOR VISIT: BLE numbness, gait instability    HISTORICAL  CHIEF COMPLAINT:  Chief Complaint  Patient presents with   New Patient (Initial Visit)    Rm 14 alone referred for Encephalopathy and Delirium. Pt reports he has been struggling with neuropathy and pain in both his feet. He also reports a lump toward the base of his head (left side) and feels like it has increased in size over the last month. He has had trouble with his vision but has been told he has cataracts.     HISTORY OF PRESENT ILLNESS:  This is a 66 year old gentleman who is presenting with complaint of bilateral lower extremity numbness and pain.  Patient has a past medical history of obesity, neuropathy, vitamin B12 deficiency, COPD, CAD, sleep apnea and hyperlipidemia.  He reports to note neuropathy has been going on for the past year and now getting worse, he reported he is unable to stand and walk anymore he is having aching throbbing pain and sometimes the pain is shooting, he denies any low back pain and denies any pain this coming from the back radiating down to the leg.  For the past few months he has stopped walking and using his motorized wheelchair  He also reports recently being diagnosed with cataracts while getting his eyes checked at the optometrist at [facility] who recommended cataract surgery and but he does not have any primary ophthalmologist.      OTHER MEDICAL CONDITIONS: Obesity, Neuropathy, COPD, CAD, sleep apnea, hyperlipidemia    REVIEW OF SYSTEMS: Full 14 system review of systems performed and negative with exception of: as noted in the HPI   ALLERGIES: Allergies  Allergen Reactions   Clindamycin/Lincomycin Anaphylaxis   Penicillins Swelling    DID THE REACTION INVOLVE: Swelling of the face/tongue/throat, SOB, or low BP? yes Sudden or  severe rash/hives, skin peeling, or the inside of the mouth or nose? yes Did it require medical treatment? yes When did it last happen? 1990 If all above answers are "NO", may proceed with cephalosporin use.    Ivp Dye [Iodinated Contrast Media] Other (See Comments)    Pt. States she was injected with IV dye for an Angio chest in June 2022 and legs began swelling the next day.   Pt. States swelling has not gone down since study 3 months ago.   Pt. Refuses all dye studies.      HOME MEDICATIONS: Outpatient Medications Prior to Visit  Medication Sig Dispense Refill   albuterol (VENTOLIN HFA) 108 (90 Base) MCG/ACT inhaler Inhale 2 puffs into the lungs every 4 (four) hours as needed for wheezing or shortness of breath. 18 g 11   diclofenac Sodium (VOLTAREN) 1 % GEL Apply 2 g topically 4 (four) times daily as needed (pain). 350 g 2   DULoxetine (CYMBALTA) 30 MG capsule Take 1 capsule (30 mg total) by mouth daily. 90 capsule 0   ezetimibe (ZETIA) 10 MG tablet Take 1 tablet (10 mg total) by mouth daily. 90 tablet 3   folic acid (FOLVITE) 1 MG tablet Take 1 tablet (1 mg total) by mouth daily. 90 tablet 1   ipratropium-albuterol (DUONEB) 0.5-2.5 (3) MG/3ML SOLN Take 3 mLs by nebulization every 6 (six) hours as needed (SOB).     magnesium oxide (MAG-OX) 400 (240 Mg) MG tablet Take 1 tablet (400 mg total) by mouth 2 (two)  times daily. 180 tablet 3   melatonin 1 MG TABS tablet Take 1 mg by mouth at bedtime.     midodrine (PROAMATINE) 5 MG tablet Take 1 tablet (5 mg total) by mouth 3 (three) times daily. 270 tablet 1   Multiple Vitamin (MULTIVITAMIN) tablet Take 1 tablet by mouth daily.     omeprazole (PRILOSEC) 40 MG capsule Take 1 capsule (40 mg total) by mouth daily. 90 capsule 1   QUEtiapine (SEROQUEL) 25 MG tablet Take 1 tablet (25 mg total) by mouth at bedtime. 90 tablet 1   senna-docusate (SENOKOT-S) 8.6-50 MG tablet Take 1 tablet by mouth 2 (two) times daily. 180 tablet 1   simvastatin (ZOCOR) 40  MG tablet Take 1 tablet (40 mg total) by mouth daily. 90 tablet 1   thiamine 100 MG tablet Take 1 tablet (100 mg total) by mouth daily. 90 tablet 1   vitamin B-12 (CYANOCOBALAMIN) 1000 MCG tablet Take 1 tablet (1,000 mcg total) by mouth daily. 90 tablet 1   gabapentin (NEURONTIN) 600 MG tablet Take 1 tablet (600 mg total) by mouth 2 (two) times daily. 180 tablet 1   No facility-administered medications prior to visit.    PAST MEDICAL HISTORY: Past Medical History:  Diagnosis Date   Anxiety    Arthritis    Blood transfusion without reported diagnosis    Brain tumor ([place])    CAD (coronary artery disease)    COPD (chronic obstructive pulmonary disease) ([place])    Depression    Hyperlipidemia    Obesity    Seizures ([place])    Sleep apnea    Stroke ([place])     PAST SURGICAL HISTORY: Past Surgical History:  Procedure Laterality Date   BRAIN SURGERY     FOOT SURGERY     SKIN LESION EXCISION      FAMILY HISTORY: History reviewed. No pertinent family history.  SOCIAL HISTORY: Social History   Socioeconomic History   Marital status: Divorced    Spouse name: Not on file   Number of children: 2   Years of education: Not on file   Highest education level: Not on file  Occupational History   Occupation: retired  Tobacco Use   Smoking status: Never   Smokeless tobacco: Never  Substance and Sexual Activity   Alcohol use: No   Drug use: No   Sexual activity: Not Currently  Other Topics Concern   Not on file  Social History Narrative   Not on file   Social Determinants of Health   Financial Resource Strain: Low Risk  (10/17/2021)   Overall Financial Resource Strain (CARDIA)    Difficulty of Paying Living Expenses: Not hard at all  Food Insecurity: No Food Insecurity (10/17/2021)   Hunger Vital Sign    Worried About Running Out of Food in the Last Year: Never true    Ran Out of Food in the Last Year: Never true  Transportation Needs: Unmet Transportation Needs (10/17/2021)    PRAPARE - Hydrologist (Medical): Yes    Lack of Transportation (Non-Medical): No  Physical Activity: Inactive (10/17/2021)   Exercise Vital Sign    Days of Exercise per Week: 0 days    Minutes of Exercise per Session: 0 min  Stress: No Stress Concern Present (10/17/2021)   [place]    Feeling of Stress : Not at all  Social Connections: Socially Isolated (10/17/2021)   Social Connection and Isolation Panel [  NHANES]    Frequency of Communication with Friends and Family: More than three times a week    Frequency of Social Gatherings with Friends and Family: More than three times a week    Attends Religious Services: Never    Marine scientist or Organizations: No    Attends Archivist Meetings: Never    Marital Status: Divorced  Human resources officer Violence: Not At Risk (10/17/2021)   Humiliation, Afraid, Rape, and Kick questionnaire    Fear of Current or Ex-Partner: No    Emotionally Abused: No    Physically Abused: No    Sexually Abused: No    PHYSICAL EXAM  GENERAL EXAM/CONSTITUTIONAL: Vitals:  Vitals:   12/05/21 1312  BP: (!) 145/97  Pulse: 87   There is no height or weight on file to calculate BMI. Wt Readings from Last 3 Encounters:  10/17/21 (!) 370 lb (167.8 kg)  10/12/21 (!) 370 lb (167.8 kg)  09/19/21 (!) 336 lb (152.4 kg)   Patient is in no distress; well developed, nourished and groomed; neck is supple  EYES: Pupils round and reactive to light, Visual fields full to confrontation, Extraocular movements intacts,   MUSCULOSKELETAL: Gait, strength, tone, movements noted in Neurologic exam below  NEUROLOGIC: MENTAL STATUS:      No data to display         awake, alert, oriented to person, place and time recent and remote memory intact normal attention and concentration language fluent, comprehension intact, naming intact fund of knowledge  appropriate  CRANIAL NERVE:  2nd, 3rd, 4th, 6th - pupils equal and reactive to light, visual fields full to confrontation, extraocular muscles intact, no nystagmus 5th - facial sensation symmetric 7th - facial strength symmetric 8th - hearing intact 9th - palate elevates symmetrically, uvula midline 11th - shoulder shrug symmetric 12th - tongue protrusion midline  MOTOR:  normal bulk and tone, full strength in the BUE. In the BLE he has weakness at hip flexion 3/5, knee extension and ankle plantar/dorsi flexion to 4 to 4+/5  SENSORY:  Decrease sensation to pinprick and vibration in the BLE up to mid shin. He also has decrease proprioception.  COORDINATION:  finger-nose-finger, fine finger movements normal  REFLEXES:  deep tendon reflexes absent in the BLEs   GAIT/STATION:  Deferred     DIAGNOSTIC DATA (LABS, IMAGING, TESTING) - I reviewed patient records, labs, notes, testing and imaging myself where available.  Lab Results  Component Value Date   WBC 9.0 09/19/2021   HGB 15.5 09/19/2021   HCT 46.8 09/19/2021   MCV 89.1 09/19/2021   PLT 226 09/19/2021      Component Value Date/Time   NA 139 09/19/2021 2031   NA 141 08/24/2021 1215   K 3.9 09/19/2021 2031   CL 104 09/19/2021 2031   CO2 25 09/19/2021 2031   GLUCOSE 120 (H) 09/19/2021 2031   BUN 19 09/19/2021 2031   BUN 31 (H) 08/24/2021 1215   CREATININE 1.29 (H) 09/19/2021 2031   CALCIUM 9.4 09/19/2021 2031   PROT 6.8 08/24/2021 1215   ALBUMIN 4.4 08/24/2021 1215   AST 17 08/24/2021 1215   ALT 12 08/24/2021 1215   ALKPHOS 78 08/24/2021 1215   BILITOT 0.3 08/24/2021 1215   GFRNONAA >60 09/19/2021 2031   GFRAA 60 (L) 10/25/2019 1150   Lab Results  Component Value Date   CHOL 202 (H) 08/24/2021   HDL 56 08/24/2021   LDLCALC 124 (H) 08/24/2021   TRIG 121 08/24/2021  CHOLHDL 3.6 08/24/2021   Lab Results  Component Value Date   HGBA1C 5.5 06/13/2021   Lab Results  Component Value Date   VITAMINB12  228 (L) 08/24/2021   Lab Results  Component Value Date   TSH 3.200 08/24/2021    MRI Brain 09/20/21 1. No evidence of acute intracranial abnormality.    ASSESSMENT AND PLAN  66 y.o. year old male with multiple medical conditions including obesity, hyperlipidemia, COPD, CAD, sleep apnea and peripheral neuropathy who is presenting with worsening peripheral neuropathy with numbness and pain. He also have difficulty with ambulation and has not been walking lately.  He is physically deconditioned.  At this time we will start with getting the neuropathy lab.  I will switch his gabapentin to pregabalin.  I will also refer him to home health services for physical therapy and nursing assistance.  I will see him in 6 months for follow-up or sooner if worse.  I will contact the patient to go over the results.     1. Peripheral polyneuropathy   2. Gait abnormality   3. Physical deconditioning      Patient Instructions  Discontinue Gabapentin  Start with Pregabalin 150 mg twice daily  Continue your other medications  Neuropathy lab panel today  Referral to home health services  Follow up in 6 months   Orders Placed This Encounter  Procedures   CBC with diff   CMP   Vitamin B12   MMA   Homocysteine   A1c   SPEP with IFE   ANA w/Reflex   SSA, SSB   ESR   CRP   Copper   Vitamin B6   Vitamin B1   Ambulatory referral to [place] ordered this encounter  Medications   pregabalin (LYRICA) 150 MG capsule    Sig: Take 1 capsule (150 mg total) by mouth 2 (two) times daily.    Dispense:  180 capsule    Refill:  3    Return in about 6 months (around 06/07/2022).    Alric Ran, MD 12/05/2021, 8:38 PM  [facility] [address] [place], [place] [address] [phone]

## 2021-12-05 NOTE — Patient Instructions (Addendum)
Discontinue Gabapentin  Start with Pregabalin 150 mg twice daily  Continue your other medications  Neuropathy lab panel today  Referral to home health services  Follow up in 6 months

## 2021-12-05 NOTE — Telephone Encounter (Signed)
Pt scheduled with Tiffany 12/10/21 at 9:15 for face to face.

## 2021-12-10 ENCOUNTER — Telehealth: Payer: Self-pay | Admitting: Neurology

## 2021-12-10 ENCOUNTER — Ambulatory Visit: Payer: Medicare Other | Admitting: Family Medicine

## 2021-12-10 NOTE — Telephone Encounter (Signed)
[facility] is taking this patient. 

## 2021-12-12 ENCOUNTER — Ambulatory Visit (INDEPENDENT_AMBULATORY_CARE_PROVIDER_SITE_OTHER): Payer: Medicare Other | Admitting: Family Medicine

## 2021-12-12 ENCOUNTER — Encounter: Payer: Self-pay | Admitting: Family Medicine

## 2021-12-12 VITALS — BP 115/79 | HR 100 | Temp 98.3°F | Ht 73.0 in

## 2021-12-12 DIAGNOSIS — G4733 Obstructive sleep apnea (adult) (pediatric): Secondary | ICD-10-CM

## 2021-12-12 DIAGNOSIS — G629 Polyneuropathy, unspecified: Secondary | ICD-10-CM

## 2021-12-12 DIAGNOSIS — N1831 Chronic kidney disease, stage 3a: Secondary | ICD-10-CM

## 2021-12-12 DIAGNOSIS — R5381 Other malaise: Secondary | ICD-10-CM

## 2021-12-12 DIAGNOSIS — K219 Gastro-esophageal reflux disease without esophagitis: Secondary | ICD-10-CM | POA: Diagnosis not present

## 2021-12-12 DIAGNOSIS — E782 Mixed hyperlipidemia: Secondary | ICD-10-CM

## 2021-12-12 DIAGNOSIS — I959 Hypotension, unspecified: Secondary | ICD-10-CM

## 2021-12-12 DIAGNOSIS — Z7409 Other reduced mobility: Secondary | ICD-10-CM

## 2021-12-12 DIAGNOSIS — E538 Deficiency of other specified B group vitamins: Secondary | ICD-10-CM | POA: Diagnosis not present

## 2021-12-12 DIAGNOSIS — H269 Unspecified cataract: Secondary | ICD-10-CM

## 2021-12-12 DIAGNOSIS — Z789 Other specified health status: Secondary | ICD-10-CM

## 2021-12-12 DIAGNOSIS — E612 Magnesium deficiency: Secondary | ICD-10-CM

## 2021-12-12 LAB — COMPREHENSIVE METABOLIC PANEL
ALT: 8 IU/L (ref 0–44)
AST: 8 IU/L (ref 0–40)
Albumin/Globulin Ratio: 1.8 (ref 1.2–2.2)
Albumin: 4 g/dL (ref 3.9–4.9)
Alkaline Phosphatase: 70 IU/L (ref 44–121)
BUN/Creatinine Ratio: 14 (ref 10–24)
BUN: 19 mg/dL (ref 8–27)
Bilirubin Total: 0.5 mg/dL (ref 0.0–1.2)
CO2: 26 mmol/L (ref 20–29)
Calcium: 9 mg/dL (ref 8.6–10.2)
Chloride: 102 mmol/L (ref 96–106)
Creatinine, Ser: 1.36 mg/dL — ABNORMAL HIGH (ref 0.76–1.27)
Globulin, Total: 2.2 g/dL (ref 1.5–4.5)
Glucose: 103 mg/dL — ABNORMAL HIGH (ref 70–99)
Potassium: 4 mmol/L (ref 3.5–5.2)
Sodium: 141 mmol/L (ref 134–144)
Total Protein: 6.2 g/dL (ref 6.0–8.5)
eGFR: 57 mL/min/{1.73_m2} — ABNORMAL LOW (ref >59)

## 2021-12-12 LAB — C-REACTIVE PROTEIN: CRP: <1 mg/L (ref 0–10)

## 2021-12-12 LAB — CBC WITH DIFFERENTIAL/PLATELET
Basophils Absolute: 0 10*3/uL (ref 0.0–0.2)
Basos: 1 %
EOS (ABSOLUTE): 0.1 10*3/uL (ref 0.0–0.4)
Eos: 2 %
Hematocrit: 43.5 % (ref 37.5–51.0)
Hemoglobin: 14.6 g/dL (ref 13.0–17.7)
Immature Grans (Abs): 0 10*3/uL (ref 0.0–0.1)
Immature Granulocytes: 0 %
Lymphocytes Absolute: 1.3 10*3/uL (ref 0.7–3.1)
Lymphs: 16 %
MCH: 30 pg (ref 26.6–33.0)
MCHC: 33.6 g/dL (ref 31.5–35.7)
MCV: 89 fL (ref 79–97)
Monocytes Absolute: 0.5 10*3/uL (ref 0.1–0.9)
Monocytes: 6 %
Neutrophils Absolute: 6.1 10*3/uL (ref 1.4–7.0)
Neutrophils: 75 %
Platelets: 236 10*3/uL (ref 150–450)
RBC: 4.87 x10E6/uL (ref 4.14–5.80)
RDW: 13.7 % (ref 11.6–15.4)
WBC: 8.1 10*3/uL (ref 3.4–10.8)

## 2021-12-12 LAB — MULTIPLE MYELOMA PANEL, SERUM
Albumin SerPl Elph-Mcnc: 3.6 g/dL (ref 2.9–4.4)
Albumin/Glob SerPl: 1.4 (ref 0.7–1.7)
Alpha 1: 0.1 g/dL (ref 0.0–0.4)
Alpha2 Glob SerPl Elph-Mcnc: 0.7 g/dL (ref 0.4–1.0)
B-Globulin SerPl Elph-Mcnc: 0.9 g/dL (ref 0.7–1.3)
Gamma Glob SerPl Elph-Mcnc: 1 g/dL (ref 0.4–1.8)
Globulin, Total: 2.6 g/dL (ref 2.2–3.9)
IgA/Immunoglobulin A, Serum: 224 mg/dL (ref 61–437)
IgG (Immunoglobin G), Serum: 1008 mg/dL (ref 603–1613)
IgM (Immunoglobulin M), Srm: 60 mg/dL (ref 20–172)
M Protein SerPl Elph-Mcnc: 0.2 g/dL — ABNORMAL HIGH

## 2021-12-12 LAB — VITAMIN B12: Vitamin B-12: 214 pg/mL — ABNORMAL LOW (ref 232–1245)

## 2021-12-12 LAB — HEMOGLOBIN A1C
Est. average glucose Bld gHb Est-mCnc: 105 mg/dL
Hgb A1c MFr Bld: 5.3 % (ref 4.8–5.6)

## 2021-12-12 LAB — COPPER, SERUM: Copper: 97 ug/dL (ref 69–132)

## 2021-12-12 LAB — SJOGREN'S SYNDROME ANTIBODS(SSA + SSB)
ENA SSA (RO) Ab: 0.2 AI (ref 0.0–0.9)
ENA SSB (LA) Ab: <0.2 AI (ref 0.0–0.9)

## 2021-12-12 LAB — HOMOCYSTEINE: Homocysteine: 11.4 umol/L (ref 0.0–17.2)

## 2021-12-12 LAB — VITAMIN B6: Vitamin B6: 2.1 ug/L — ABNORMAL LOW (ref 3.4–65.2)

## 2021-12-12 LAB — METHYLMALONIC ACID, SERUM: Methylmalonic Acid: 223 nmol/L (ref 0–378)

## 2021-12-12 LAB — VITAMIN B1: Thiamine: 135 nmol/L (ref 66.5–200.0)

## 2021-12-12 LAB — SEDIMENTATION RATE: Sed Rate: 7 mm/hr (ref 0–30)

## 2021-12-12 LAB — ANA W/REFLEX: ANA Titer 1: NEGATIVE

## 2021-12-12 MED ORDER — CYANOCOBALAMIN 1000 MCG/ML IJ SOLN: 1000.0000 ug | INTRAMUSCULAR | Status: DC

## 2021-12-12 MED ORDER — CYANOCOBALAMIN 1000 MCG/ML IJ SOLN
1000.0000 ug | INTRAMUSCULAR | Status: AC
  Administered 2021-12-12 – 2022-01-04 (×4): 1000 ug via INTRAMUSCULAR

## 2021-12-12 NOTE — Progress Notes (Signed)
Established Patient Office Visit  Subjective   Patient ID: Edwin Snyder, male    DOB: 12-Apr-1956  Age: 66 y.o. MRN: [id number]  Chief Complaint  Patient presents with   Face to Face for [place] is here for a face to face for [facility]. He recently had an appointment with neurology and was not aware that a [place] referral was placed at that visit by his neurologist. [facility] will be taking on his case. His niece is currently coming by twice a day and helping with meals and house work. He is using his motorized scooter and a wheelchair for ambulation. He is using a urinal and bedside commode. His B12 level was still low on the labs his neurologist checked. He has been taking a daily oral supplement. He was switched from gabapentin to lyrica and reports that this has helped with his neuropathy.   He is not fasting this morning and ate pancakes shortly before his visit today.   He would like a referral to sleep medicine for his OSA. He wears his CPAP machine but reports difficulty with the fit. He has had this machine for over 10 years.   He also recently had an eye exam due to blurry vision. He was told that he has bilateral cataracts. He would like a referral to have this addressed.   He has been taking medications as prescribed. He denies side effects of medications. He does not check his BP at home.   Past Medical History:  Diagnosis Date   Anxiety    Arthritis    Blood transfusion without reported diagnosis    Brain tumor ([place])    CAD (coronary artery disease)    COPD (chronic obstructive pulmonary disease) (HCC)    Depression    Hyperlipidemia    Obesity    Seizures ([place])    Sleep apnea    Stroke ([place])       Review of Systems  Constitutional:  Negative for chills, diaphoresis, fever, malaise/fatigue and weight loss.  Eyes:  Positive for blurred vision. Negative for double vision and photophobia.  Respiratory:  Negative for shortness of breath and wheezing.    Cardiovascular:  Negative for chest pain, palpitations and leg swelling.  Gastrointestinal:  Positive for heartburn (occasional). Negative for nausea and vomiting.  Musculoskeletal:  Positive for myalgias (chronic pain).  Neurological:  Positive for tingling (chronic neuropahty) and weakness (due to physical deconditioning). Negative for dizziness, focal weakness, seizures and loss of consciousness.  Psychiatric/Behavioral:  Negative for hallucinations and memory loss.       Objective:     BP 115/79   Pulse 100   Temp 98.3 F (36.8 C) (Temporal)   Ht '6\' 1"'$  (1.854 m)   SpO2 94%   BMI 48.82 kg/m  BP Readings from Last 3 Encounters:  12/12/21 115/79  12/05/21 (!) 145/97  10/12/21 131/84      Physical Exam Vitals and nursing note reviewed.  Constitutional:      General: He is not in acute distress.    Appearance: He is not ill-appearing, toxic-appearing or diaphoretic.  HENT:     Mouth/Throat:     Mouth: Mucous membranes are moist.     Pharynx: Oropharynx is clear.  Eyes:     Conjunctiva/sclera: Conjunctivae normal.     Pupils: Pupils are equal, round, and reactive to light.  Cardiovascular:     Rate and Rhythm: Normal rate and regular rhythm.  Heart sounds: Normal heart sounds. No murmur heard. Pulmonary:     Effort: Pulmonary effort is normal.     Breath sounds: Normal breath sounds.  Abdominal:     General: Bowel sounds are normal. There is no distension.     Palpations: Abdomen is soft.     Tenderness: There is no abdominal tenderness. There is no guarding or rebound.  Musculoskeletal:     Cervical back: Neck supple. No rigidity.     Right lower leg: No edema.     Left lower leg: No edema.  Skin:    General: Skin is warm and dry.  Neurological:     Mental Status: He is alert and oriented to person, place, and time. Mental status is at baseline.     Motor: Weakness (bilateral lower legs) present.     Gait: Gait abnormal (arrives in motorized wheelchair).   Psychiatric:        Mood and Affect: Mood normal.        Behavior: Behavior normal.      No results found for any visits on 12/12/21.    The 10-year ASCVD risk score (Arnett DK, et al., 2019) is: 12.6%    Assessment & Plan:   Edwin Snyder was seen today for face to face for home health.  Diagnoses and all orders for this visit:  Hypotension, unspecified hypotension type BP stable with midodrine. Reviewed CBC and CMP from neurology on 12/05/21- no significant abnormalities.   Gastroesophageal reflux disease, unspecified whether esophagitis present On prilosec. Well controlled on current regimen.   Stage 3a chronic kidney disease (CKD) (HCC) GFR 57 on labs from 12/05/21. Stable.   Mixed hyperlipidemia On zetia. He will return for lasting lipid panel.  -     Cancel: Lipid Panel -     Lipid Panel; Future  Magnesium deficiency On multivitamin. He will return for magnesium level.  -     Cancel: Magnesium -     Magnesium; Future  B12 deficiency B12 level was 214 on 12/05/21 with oral supplement. Will start IM injections. First IM injection given today in the office.  -     cyanocobalamin (VITAMIN B12) injection 1,000 mcg -     cyanocobalamin (VITAMIN B12) injection 1,000 mcg  Neuropathy Managed by neurology and pain management. On lyrica.   Impaired mobility and ADLs Physical deconditioning Due to neuropathy. Given contact information for authorized referral for [facility] through neurologist.   Cataract of both eyes, unspecified cataract type Referral placed.  -     Ambulatory referral to Ophthalmology  Obstructive sleep apnea syndrome Referral placed.  -     Ambulatory referral to Sleep Studies   Return in about 3 months (around 03/14/2022) for chronic follow up.  The patient indicates understanding of these issues and agrees with the plan.    Gwenlyn Perking, FNP

## 2021-12-12 NOTE — Patient Instructions (Signed)
[place] [phone]

## 2021-12-14 ENCOUNTER — Other Ambulatory Visit: Payer: Self-pay | Admitting: Neurology

## 2021-12-14 MED ORDER — VITAMIN B6 50 MG PO TABS: 50.0000 mg | ORAL_TABLET | Freq: Every day | ORAL | 0 refills | Status: AC

## 2021-12-19 ENCOUNTER — Telehealth: Payer: Self-pay | Admitting: Neurology

## 2021-12-19 ENCOUNTER — Ambulatory Visit (INDEPENDENT_AMBULATORY_CARE_PROVIDER_SITE_OTHER): Payer: Medicare Other

## 2021-12-19 DIAGNOSIS — E538 Deficiency of other specified B group vitamins: Secondary | ICD-10-CM

## 2021-12-19 NOTE — Telephone Encounter (Signed)
PT Edwin Snyder @ [facility] has called for verbal orders for 2 week 3 his call back # is [phone] secure vm

## 2021-12-19 NOTE — Progress Notes (Signed)
B12 given to patient and tolerated well. 

## 2021-12-19 NOTE — Telephone Encounter (Signed)
I called Remo Lipps back and provided verbal order via voicemail and advised to call back with any questions/concerns.

## 2021-12-26 ENCOUNTER — Ambulatory Visit: Payer: Medicare Other

## 2021-12-28 ENCOUNTER — Ambulatory Visit (INDEPENDENT_AMBULATORY_CARE_PROVIDER_SITE_OTHER): Payer: Medicare Other

## 2021-12-28 DIAGNOSIS — E538 Deficiency of other specified B group vitamins: Secondary | ICD-10-CM

## 2021-12-28 NOTE — Progress Notes (Signed)
Cyanocobalamin injection given to left deltoid.  Patient tolerated well. 

## 2022-01-04 ENCOUNTER — Ambulatory Visit (INDEPENDENT_AMBULATORY_CARE_PROVIDER_SITE_OTHER): Payer: Medicare Other | Admitting: *Deleted

## 2022-01-04 DIAGNOSIS — E538 Deficiency of other specified B group vitamins: Secondary | ICD-10-CM

## 2022-01-15 ENCOUNTER — Telehealth: Payer: Self-pay | Admitting: Family Medicine

## 2022-01-15 DIAGNOSIS — R062 Wheezing: Secondary | ICD-10-CM

## 2022-01-16 MED ORDER — ALBUTEROL SULFATE HFA 108 (90 BASE) MCG/ACT IN AERS: 2.0000 | INHALATION_SPRAY | RESPIRATORY_TRACT | 6 refills | Status: DC | PRN

## 2022-01-16 NOTE — Telephone Encounter (Signed)
Needs appt for [facility] referral. Ok to refill inhaler

## 2022-01-16 NOTE — Addendum Note (Signed)
Addended by: Antonietta Barcelona D on: 01/16/2022 02:47 PM   Modules accepted: Orders

## 2022-01-16 NOTE — Telephone Encounter (Signed)
Aware and apt scheduled.

## 2022-01-18 ENCOUNTER — Ambulatory Visit (INDEPENDENT_AMBULATORY_CARE_PROVIDER_SITE_OTHER): Payer: Medicare Other | Admitting: Family Medicine

## 2022-01-18 ENCOUNTER — Encounter: Payer: Self-pay | Admitting: Family Medicine

## 2022-01-18 VITALS — BP 132/83 | HR 86 | Temp 98.5°F | Ht 73.0 in | Wt 370.0 lb

## 2022-01-18 DIAGNOSIS — Z23 Encounter for immunization: Secondary | ICD-10-CM | POA: Diagnosis not present

## 2022-01-18 DIAGNOSIS — R269 Unspecified abnormalities of gait and mobility: Secondary | ICD-10-CM | POA: Diagnosis not present

## 2022-01-18 DIAGNOSIS — Z789 Other specified health status: Secondary | ICD-10-CM

## 2022-01-18 DIAGNOSIS — R410 Disorientation, unspecified: Secondary | ICD-10-CM

## 2022-01-18 DIAGNOSIS — I10 Essential (primary) hypertension: Secondary | ICD-10-CM | POA: Diagnosis not present

## 2022-01-18 DIAGNOSIS — R5381 Other malaise: Secondary | ICD-10-CM | POA: Diagnosis not present

## 2022-01-18 DIAGNOSIS — Z7409 Other reduced mobility: Secondary | ICD-10-CM | POA: Diagnosis not present

## 2022-01-18 DIAGNOSIS — G629 Polyneuropathy, unspecified: Secondary | ICD-10-CM

## 2022-01-18 NOTE — Progress Notes (Signed)
   Established Patient Office Visit  Subjective   Patient ID: Edwin Snyder, male    DOB: 1955-11-03  Age: 66 y.o. MRN: [id number]  Chief Complaint  Patient presents with  . Face to Face    HPI   He has been getting HH. They were doing some strengthing exercises and was able to actually take a few steps with the PT. There was also an aide that helped with bathing. A nurse was also coming out to check vitals and help with medication management. This was completed last week and was very helpful. He would like to continue this.   Reprots lyrica has been helpful.    {History (Optional):23778}  ROS    Objective:     There were no vitals taken for this visit. {Vitals History (Optional):23777}  Physical Exam   No results found for any visits on 01/18/22.  {Labs (Optional):23779}  The 10-year ASCVD risk score (Arnett DK, et al., 2019) is: 12.6%    Assessment & Plan:   Problem List Items Addressed This Visit   None   No follow-ups on file.    Gwenlyn Perking, FNP

## 2022-01-23 ENCOUNTER — Telehealth: Payer: Self-pay | Admitting: Family Medicine

## 2022-01-23 NOTE — Telephone Encounter (Signed)
TC back to Leann, let her know that I did a PCS form on the pt & faxed it to [facility] on 01/15/22. VO given for SW if that would help facilitate getting [employer] in w/ pt in case they have tried contacting him and he was not understanding what they were calling about.

## 2022-01-29 ENCOUNTER — Other Ambulatory Visit: Payer: Self-pay | Admitting: Physical Medicine & Rehabilitation

## 2022-01-31 ENCOUNTER — Telehealth: Payer: Self-pay | Admitting: Family Medicine

## 2022-01-31 NOTE — Telephone Encounter (Signed)
Mel Al[facility] Nurse) called to inform PCP that pt has been complaining of having dizzy spells and elevated BP readings. Says his most recent one was 138/92.   (Patient does have an appt to see triage on Monday 02/04/22 to get a B12 shot and have BP checked) unless PCP wants to see pt sooner.

## 2022-01-31 NOTE — Telephone Encounter (Signed)
BP is at goal with that reading. If dizziness is mild and the only symptom, ok to continue to monitor.

## 2022-02-01 NOTE — Telephone Encounter (Signed)
Daughter is aware of provider feedback and voiced understanding.

## 2022-02-04 ENCOUNTER — Ambulatory Visit: Payer: Medicare Other

## 2022-02-14 ENCOUNTER — Telehealth: Payer: Self-pay | Admitting: *Deleted

## 2022-02-14 NOTE — Telephone Encounter (Signed)
TC from [place] [facility] w/ [facility] Seeing pt now, pt had a fall yesterday, fell on L knee & R wrist, wrist is swollen, pain all over, nauseated from pain, doesn't have meds for pain. BP 126/92. Went ahead & made appt w/ Evelina Dun for tomorrow morning at 10:55

## 2022-02-15 ENCOUNTER — Encounter: Payer: Self-pay | Admitting: Family Medicine

## 2022-02-15 ENCOUNTER — Ambulatory Visit: Payer: Medicare Other | Admitting: Family

## 2022-02-18 ENCOUNTER — Ambulatory Visit: Payer: Medicare Other | Admitting: Family Medicine

## 2022-02-19 ENCOUNTER — Ambulatory Visit (INDEPENDENT_AMBULATORY_CARE_PROVIDER_SITE_OTHER): Payer: Medicare Other | Admitting: Nurse Practitioner

## 2022-02-19 ENCOUNTER — Encounter: Payer: Self-pay | Admitting: Nurse Practitioner

## 2022-02-19 VITALS — BP 123/87 | HR 93 | Temp 98.5°F | Ht 73.0 in | Wt >= 6400 oz

## 2022-02-19 DIAGNOSIS — M25562 Pain in left knee: Secondary | ICD-10-CM

## 2022-02-19 MED ORDER — DICLOFENAC SODIUM 1 % EX GEL: 2.0000 g | Freq: Four times a day (QID) | CUTANEOUS | 1 refills | Status: DC

## 2022-02-19 MED ORDER — PREDNISONE 20 MG PO TABS: 20.0000 mg | ORAL_TABLET | Freq: Every day | ORAL | 0 refills | Status: DC

## 2022-02-19 MED ORDER — METHOCARBAMOL 500 MG PO TABS: 500.0000 mg | ORAL_TABLET | Freq: Four times a day (QID) | ORAL | 0 refills | Status: DC

## 2022-02-19 NOTE — Patient Instructions (Signed)
Acute Knee Pain, Adult Many things can cause knee pain. Sometimes, knee pain is sudden (acute) and may be caused by damage, swelling, or irritation of the muscles and tissues that support your knee. The pain often goes away on its own with time and rest. If the pain does not go away, tests may be done to find out what is causing the pain. Follow these instructions at home: If you have a knee sleeve or brace:  Wear the knee sleeve or brace as told by your doctor. Take it off only as told by your doctor. Loosen it if your toes: Tingle. Become numb. Turn cold and blue. Keep it clean. If the knee sleeve or brace is not waterproof: Do not let it get wet. Cover it with a watertight covering when you take a bath or shower. Activity Rest your knee. Do not do things that cause pain or make pain worse. Avoid activities where both feet leave the ground at the same time (high-impact activities). Examples are running, jumping rope, and doing jumping jacks. Work with a physical therapist to make a safe exercise program, as told by your doctor. Managing pain, stiffness, and swelling  If told, put ice on the knee. To do this: If you have a removable knee sleeve or brace, take it off as told by your doctor. Put ice in a plastic bag. Place a towel between your skin and the bag. Leave the ice on for 20 minutes, 2-3 times a day. Take off the ice if your skin turns bright red. This is very important. If you cannot feel pain, heat, or cold, you have a greater risk of damage to the area. If told, use an elastic bandage to put pressure (compression) on your injured knee. Raise your knee above the level of your heart while you are sitting or lying down. Sleep with a pillow under your knee. General instructions Take over-the-counter and prescription medicines only as told by your doctor. Do not smoke or use any products that contain nicotine or tobacco. If you need help quitting, ask your doctor. If you are  overweight, work with your doctor and a food expert (dietitian) to set goals to lose weight. Being overweight can make your knee hurt more. Watch for any changes in your symptoms. Keep all follow-up visits. Contact a doctor if: The knee pain does not stop. The knee pain changes or gets worse. You have a fever along with knee pain. Your knee is red or feels warm when you touch it. Your knee gives out or locks up. Get help right away if: Your knee swells, and the swelling gets worse. You cannot move your knee. You have very bad knee pain that does not get better with pain medicine. Summary Many things can cause knee pain. The pain often goes away on its own with time and rest. Your doctor may do tests to find out the cause of the pain. Watch for any changes in your symptoms. Relieve your pain with rest, medicines, light activity, and use of ice. Get help right away if you cannot move your knee or your knee pain is very bad. This information is not intended to replace advice given to you by your health care provider. Make sure you discuss any questions you have with your health care provider. Document Revised: 09/29/2019 Document Reviewed: 09/29/2019 Elsevier Patient Education  2023 [employer]  

## 2022-02-19 NOTE — Progress Notes (Signed)
Acute Office Visit  Subjective:     Patient ID: Edwin Snyder, male    DOB: Aug 27, 1955, 66 y.o.   MRN: [id number]  Chief Complaint  Patient presents with   Fall   Knee Pain    Fall The accident occurred 5 to 7 days ago. The fall occurred while standing. He fell from an unknown height. He landed on [place]. The point of impact was the left knee. The pain is present in the left knee. The pain is at a severity of 8/10. The pain is severe. Pertinent negatives include no fever, numbness or tingling.  Knee Pain  The incident occurred 3 to 5 days ago. The incident occurred at home. The injury mechanism was a fall. The pain is present in the left knee. The pain is at a severity of 8/10. The pain is severe. The pain has been Intermittent since onset. Pertinent negatives include no loss of sensation, numbness or tingling. He reports no foreign bodies present. The symptoms are aggravated by movement and palpation. He has tried ice for the symptoms. The treatment provided no relief.    Review of Systems  Constitutional: Negative.  Negative for chills and fever.  HENT: Negative.    Eyes: Negative.   Respiratory: Negative.    Cardiovascular: Negative.   Musculoskeletal:  Positive for joint pain.  Skin: Negative.  Negative for itching and rash.  Neurological:  Negative for tingling and numbness.  All other systems reviewed and are negative.       Objective:    BP 123/87   Pulse 93   Temp 98.5 F (36.9 C)   Ht '6\' 1"'$  (1.854 m)   Wt (!) 409 lb (185.5 kg)   SpO2 94%   BMI 53.96 kg/m  BP Readings from Last 3 Encounters:  02/19/22 123/87  01/18/22 132/83  12/12/21 115/79      Physical Exam Vitals and nursing note reviewed.  Constitutional:      Appearance: Normal appearance.  HENT:     Head: Normocephalic.     Right Ear: External ear normal.     Left Ear: External ear normal.     Nose: Nose normal.     Mouth/Throat:     Mouth: Mucous membranes are moist.     Pharynx:  Oropharynx is clear.  Eyes:     Conjunctiva/sclera: Conjunctivae normal.  Cardiovascular:     Rate and Rhythm: Regular rhythm.     Pulses: Normal pulses.     Heart sounds: Normal heart sounds.  Pulmonary:     Effort: Pulmonary effort is normal.     Breath sounds: Normal breath sounds.  Abdominal:     General: Bowel sounds are normal.  Musculoskeletal:     Left knee: Decreased range of motion. Tenderness present. No MCL, LCL, ACL or PCL tenderness. No LCL laxity, MCL laxity, ACL laxity or PCL laxity.Normal pulse.       Legs:     Comments: Left knee pain from recent fall  Skin:    General: Skin is warm.     Findings: No erythema or rash.  Neurological:     Mental Status: He is alert and oriented to person, place, and time.     No results found for any visits on 02/19/22.      Assessment & Plan:  Patient presents with left knee pain, and bilateral upper extremity wrist pain that is gradually resolving.  Patient reports falling at home on his carpet from a standing position.  Completed assessment.  Started patient on Voltaren gel as he is unable to take anti-inflammatory and Tylenol. Muscle relaxant Robaxin 500 mg tablet by mouth as needed. Warm compress as tolerated.  Follow-up with worsening unresolved symptoms.   We talked about ways of keeping environment safe to reduce fall and injury. Problem List Items Addressed This Visit   None Visit Diagnoses     Acute pain of left knee    -  Primary   Relevant Medications   diclofenac Sodium (VOLTAREN) 1 % GEL   methocarbamol (ROBAXIN) 500 MG tablet   predniSONE (DELTASONE) 20 MG tablet       Meds ordered this encounter  Medications   diclofenac Sodium (VOLTAREN) 1 % GEL    Sig: Apply 2 g topically 4 (four) times daily.    Dispense:  100 g    Refill:  1    Order Specific Question:   Supervising Provider    Answer:   Jeneen Rinks   methocarbamol (ROBAXIN) 500 MG tablet    Sig: Take 1 tablet (500 mg total)  by mouth 4 (four) times daily.    Dispense:  30 tablet    Refill:  0    Order Specific Question:   Supervising Provider    Answer:   Jeneen Rinks   predniSONE (DELTASONE) 20 MG tablet    Sig: Take 1 tablet (20 mg total) by mouth daily with breakfast.    Dispense:  6 tablet    Refill:  0    Order Specific Question:   Supervising Provider    Answer:   Jeneen Rinks    Return if symptoms worsen or fail to improve.  Ivy Lynn, NP

## 2022-02-20 ENCOUNTER — Ambulatory Visit (INDEPENDENT_AMBULATORY_CARE_PROVIDER_SITE_OTHER): Payer: Medicare Other

## 2022-02-20 DIAGNOSIS — I959 Hypotension, unspecified: Secondary | ICD-10-CM

## 2022-02-20 DIAGNOSIS — G629 Polyneuropathy, unspecified: Secondary | ICD-10-CM | POA: Diagnosis not present

## 2022-02-20 DIAGNOSIS — E785 Hyperlipidemia, unspecified: Secondary | ICD-10-CM

## 2022-02-20 DIAGNOSIS — G894 Chronic pain syndrome: Secondary | ICD-10-CM | POA: Diagnosis not present

## 2022-02-20 DIAGNOSIS — I129 Hypertensive chronic kidney disease with stage 1 through stage 4 chronic kidney disease, or unspecified chronic kidney disease: Secondary | ICD-10-CM

## 2022-02-20 DIAGNOSIS — E440 Moderate protein-calorie malnutrition: Secondary | ICD-10-CM

## 2022-02-20 DIAGNOSIS — N1831 Chronic kidney disease, stage 3a: Secondary | ICD-10-CM

## 2022-02-20 DIAGNOSIS — D539 Nutritional anemia, unspecified: Secondary | ICD-10-CM

## 2022-02-25 ENCOUNTER — Telehealth: Payer: Self-pay | Admitting: Family Medicine

## 2022-02-25 NOTE — Telephone Encounter (Signed)
Increased dizziness and blurred vision, patient has two blisters on left arm - he started two new medications after appointment on 10/24   Patient's number [phone]

## 2022-02-25 NOTE — Telephone Encounter (Signed)
I spoke to pt and he is c/o SOB, dizziness, weakness, feeling like he is going to pass out and visual disturbances I advised pt he should be seen and evaluated at ED and pt states he doesn't want to go to ED but I advised pt with the symptoms he is describing he needs to be evaluated and I strongly urged pt to go to ED. Pt says he will try to get to UC.    PCP aware and agrees pt should go to ED for evaluation.

## 2022-03-07 ENCOUNTER — Telehealth: Payer: Self-pay | Admitting: *Deleted

## 2022-03-07 NOTE — Telephone Encounter (Signed)
I agree with recommendation to go to ER.

## 2022-03-07 NOTE — Telephone Encounter (Signed)
TC from [place] w/ [facility] With pt his BP is 178/106 & he has been vomiting today, instructed pt to go to the ED to be evaluated, HH had made this same recommendation & pt refused.

## 2022-03-08 NOTE — Telephone Encounter (Signed)
Lmtcb.

## 2022-03-14 ENCOUNTER — Telehealth: Payer: Self-pay | Admitting: Family Medicine

## 2022-03-15 ENCOUNTER — Ambulatory Visit (INDEPENDENT_AMBULATORY_CARE_PROVIDER_SITE_OTHER): Payer: Medicare Other | Admitting: Family Medicine

## 2022-03-15 ENCOUNTER — Encounter: Payer: Self-pay | Admitting: Family Medicine

## 2022-03-15 VITALS — BP 131/87 | HR 107 | Temp 97.7°F | Ht 73.0 in | Wt >= 6400 oz

## 2022-03-15 DIAGNOSIS — E782 Mixed hyperlipidemia: Secondary | ICD-10-CM

## 2022-03-15 DIAGNOSIS — R5381 Other malaise: Secondary | ICD-10-CM

## 2022-03-15 DIAGNOSIS — R7989 Other specified abnormal findings of blood chemistry: Secondary | ICD-10-CM

## 2022-03-15 DIAGNOSIS — E519 Thiamine deficiency, unspecified: Secondary | ICD-10-CM

## 2022-03-15 DIAGNOSIS — Z789 Other specified health status: Secondary | ICD-10-CM

## 2022-03-15 DIAGNOSIS — I951 Orthostatic hypotension: Secondary | ICD-10-CM

## 2022-03-15 DIAGNOSIS — K219 Gastro-esophageal reflux disease without esophagitis: Secondary | ICD-10-CM

## 2022-03-15 DIAGNOSIS — I10 Essential (primary) hypertension: Secondary | ICD-10-CM

## 2022-03-15 DIAGNOSIS — G894 Chronic pain syndrome: Secondary | ICD-10-CM

## 2022-03-15 DIAGNOSIS — E538 Deficiency of other specified B group vitamins: Secondary | ICD-10-CM | POA: Insufficient documentation

## 2022-03-15 DIAGNOSIS — E7220 Disorder of urea cycle metabolism, unspecified: Secondary | ICD-10-CM

## 2022-03-15 DIAGNOSIS — G629 Polyneuropathy, unspecified: Secondary | ICD-10-CM

## 2022-03-15 DIAGNOSIS — Z23 Encounter for immunization: Secondary | ICD-10-CM

## 2022-03-15 DIAGNOSIS — N1831 Chronic kidney disease, stage 3a: Secondary | ICD-10-CM

## 2022-03-15 DIAGNOSIS — Z7409 Other reduced mobility: Secondary | ICD-10-CM

## 2022-03-15 DIAGNOSIS — R233 Spontaneous ecchymoses: Secondary | ICD-10-CM

## 2022-03-15 NOTE — Telephone Encounter (Signed)
Pt was seen today in office and this was addressed.

## 2022-03-15 NOTE — Telephone Encounter (Signed)
Will address at visit today.

## 2022-03-15 NOTE — Progress Notes (Signed)
Established Patient Office Visit  Subjective   Patient ID: Edwin Snyder, male    DOB: 1956/04/10  Age: 66 y.o. MRN: [id number]  Chief Complaint  Patient presents with   Medical Management of Chronic Issues   Hypertension    HPI Edwin Snyder is here for his chronic follow up.   He reports doing ok since his last visit. PT is still coming out twice a week. He reports that this is helpful. He has been able to stand and take a few steps since working with them. He continues to have chronic pain and polyneuropathy. He seems pain management for this but doesn't feel like he is well controlled. He is also following with neurology and has an appointment coming up in a few months. He has missed his B12 injections for the last 2 months.   He continues to have some dizziness when he stands. He takes midodrine for this. He believes he is only taking this twice a day. His BP has been elevated when PT has come out and has checked his BP. He reports that this is because they are checking it after the PT session when he has been exercising. He also recently has been on steroids for pain. He does not have a BP monitor at home to check his BP himself.   He reports easy bruising to both arms. He denies trauma or falls.   He denies chest pain, increased edema, visual disturbances, increased shortness of breath, productive cough, wheezing.      ROS As per HPI.    Objective:     BP 131/87   Pulse (!) 107   Temp 97.7 F (36.5 C) (Temporal)   Ht _0  (1.854 m)   Wt (!) 409 lb (185.5 kg)   SpO2 98%   BMI 53.96 kg/m  BP Readings from Last 3 Encounters:  03/15/22 131/87  02/19/22 123/87  01/18/22 132/83      Physical Exam Vitals and nursing note reviewed.  Constitutional:      General: He is not in acute distress.    Appearance: He is not ill-appearing, toxic-appearing or diaphoretic.  HENT:     Head: Normocephalic and atraumatic.     Mouth/Throat:     Mouth: Mucous membranes are moist.      Pharynx: Oropharynx is clear.  Eyes:     Conjunctiva/sclera: Conjunctivae normal.     Pupils: Pupils are equal, round, and reactive to light.  Cardiovascular:     Rate and Rhythm: Normal rate and regular rhythm.     Heart sounds: Normal heart sounds. No murmur heard. Pulmonary:     Effort: Pulmonary effort is normal.     Breath sounds: Normal breath sounds.  Abdominal:     General: Bowel sounds are normal. There is no distension.     Palpations: Abdomen is soft.     Tenderness: There is no abdominal tenderness. There is no guarding or rebound.  Musculoskeletal:     Cervical back: Neck supple. No rigidity.     Right lower leg: 2+ Edema present.     Left lower leg: 2+ Edema present.  Skin:    General: Skin is warm and dry.     Findings: Bruising (to bilateral forearms) present.  Neurological:     Mental Status: He is alert and oriented to person, place, and time. Mental status is at baseline.     Motor: Weakness (generalized, greater in bilateral lower legs) present.     Gait: Gait  abnormal (arrives in motorized wheelchair).  Psychiatric:        Mood and Affect: Mood normal.        Behavior: Behavior normal.        Thought Content: Thought content normal.        Judgment: Judgment normal.      No results found for any visits on 03/15/22.    The 10-year ASCVD risk score (Arnett DK, et al., 2019) is: 15.6%    Assessment & Plan:   Edwin Snyder was seen today for medical management of chronic issues and hypertension.  Diagnoses and all orders for this visit:  Primary hypertension BP has been well controlled at his last 3 visit. Reports HH readings have been done after PT sessions and physical activity. Labs pending.  -     CMP14+EGFR -     CBC with Differential/Platelet -     TSH  Orthostatic hypotension On midodrine. Reports taking BID rather than TID. Continues to have some intermittent dizziness with standing. Discussed compliance with medication.  -      CMP14+EGFR -     CBC with Differential/Platelet  Stage 3a chronic kidney disease (CKD) (HCC) Labs pending.  -     CMP14+EGFR -     CBC with Differential/Platelet  Mixed hyperlipidemia On zetia and simvastatin.  -     CMP14+EGFR -     CBC with Differential/Platelet  B12 deficiency Has missed last 2 injections. Labs pending.  -     Vitamin B12  Hyperammonemia ([place]) Labs pending.  -     Ammonia  Chronic pain syndrome Neuropathy Managed by pain management. Will check B12 today.  -     Vitamin B12  Physical deconditioning Impaired mobility and ADLs Continue home PT.   Elevated TSH Labs pending.  -     TSH  Gastroesophageal reflux disease, unspecified whether esophagitis present Well controlled on current regimen.  -     CMP14+EGFR -     CBC with Differential/Platelet  Hypomagnesemia On supplement. Labs pending.  -     Magnesium  Thiamine deficiency On supplement. Labs pending.  -     Vitamin B1  Easy bruising Will check CBC with diff/platelet -     CBC with Differential/Platelet  Need for vaccination -     Zoster Recombinant (Shingrix )     Return in about 3 months (around 06/15/2022) for chronic follow up.    Gwenlyn Perking, FNP

## 2022-03-19 LAB — CBC WITH DIFFERENTIAL/PLATELET
Basophils Absolute: 0 10*3/uL (ref 0.0–0.2)
Basos: 0 %
EOS (ABSOLUTE): 0.1 10*3/uL (ref 0.0–0.4)
Eos: 1 %
Hematocrit: 47.3 % (ref 37.5–51.0)
Hemoglobin: 15.6 g/dL (ref 13.0–17.7)
Immature Grans (Abs): 0 10*3/uL (ref 0.0–0.1)
Immature Granulocytes: 0 %
Lymphocytes Absolute: 1 10*3/uL (ref 0.7–3.1)
Lymphs: 15 %
MCH: 29.8 pg (ref 26.6–33.0)
MCHC: 33 g/dL (ref 31.5–35.7)
MCV: 90 fL (ref 79–97)
Monocytes Absolute: 0.4 10*3/uL (ref 0.1–0.9)
Monocytes: 6 %
Neutrophils Absolute: 5.4 10*3/uL (ref 1.4–7.0)
Neutrophils: 78 %
Platelets: 202 10*3/uL (ref 150–450)
RBC: 5.23 x10E6/uL (ref 4.14–5.80)
RDW: 13.7 % (ref 11.6–15.4)
WBC: 6.9 10*3/uL (ref 3.4–10.8)

## 2022-03-19 LAB — CMP14+EGFR
ALT: 14 IU/L (ref 0–44)
AST: 16 IU/L (ref 0–40)
Albumin/Globulin Ratio: 1.7 (ref 1.2–2.2)
Albumin: 3.8 g/dL — ABNORMAL LOW (ref 3.9–4.9)
Alkaline Phosphatase: 76 IU/L (ref 44–121)
BUN/Creatinine Ratio: 16 (ref 10–24)
BUN: 23 mg/dL (ref 8–27)
Bilirubin Total: 0.4 mg/dL (ref 0.0–1.2)
CO2: 25 mmol/L (ref 20–29)
Calcium: 8.8 mg/dL (ref 8.6–10.2)
Chloride: 103 mmol/L (ref 96–106)
Creatinine, Ser: 1.42 mg/dL — ABNORMAL HIGH (ref 0.76–1.27)
Globulin, Total: 2.3 g/dL (ref 1.5–4.5)
Glucose: 135 mg/dL — ABNORMAL HIGH (ref 70–99)
Potassium: 4.1 mmol/L (ref 3.5–5.2)
Sodium: 143 mmol/L (ref 134–144)
Total Protein: 6.1 g/dL (ref 6.0–8.5)
eGFR: 54 mL/min/{1.73_m2} — ABNORMAL LOW (ref >59)

## 2022-03-19 LAB — MAGNESIUM: Magnesium: 1.5 mg/dL — ABNORMAL LOW (ref 1.6–2.3)

## 2022-03-19 LAB — VITAMIN B1: Thiamine: 152.4 nmol/L (ref 66.5–200.0)

## 2022-03-19 LAB — AMMONIA: Ammonia: 32 ug/dL — ABNORMAL LOW (ref 40–200)

## 2022-03-19 LAB — VITAMIN B12: Vitamin B-12: 460 pg/mL (ref 232–1245)

## 2022-03-19 LAB — TSH: TSH: 1.94 u[IU]/mL (ref 0.450–4.500)

## 2022-03-25 ENCOUNTER — Telehealth: Payer: Self-pay | Admitting: Family Medicine

## 2022-03-25 DIAGNOSIS — K219 Gastro-esophageal reflux disease without esophagitis: Secondary | ICD-10-CM

## 2022-03-25 DIAGNOSIS — E785 Hyperlipidemia, unspecified: Secondary | ICD-10-CM

## 2022-03-25 MED ORDER — OMEPRAZOLE 40 MG PO CPDR: 40.0000 mg | DELAYED_RELEASE_CAPSULE | Freq: Every day | ORAL | 1 refills | Status: DC

## 2022-03-25 MED ORDER — SIMVASTATIN 40 MG PO TABS: 40.0000 mg | ORAL_TABLET | Freq: Every day | ORAL | 1 refills | Status: DC

## 2022-03-25 NOTE — Telephone Encounter (Signed)
Aware refills sent to pharmacy 

## 2022-03-25 NOTE — Telephone Encounter (Signed)
  Prescription Request  03/25/2022  Is this a "Controlled Substance" medicine? no  Have you seen your PCP in the last 2 weeks? no  If YES, route message to pool  -  If NO, patient needs to be scheduled for appointment.  What is the name of the medication or equipment? omeprazole (PRILOSEC) 40 MG capsule and simvastatin (ZOCOR) 40 MG tablet   Have you contacted your pharmacy to request a refill? yes   Which pharmacy would you like this sent to?  [facility] [place], [address]       Patient notified that their request is being sent to the clinical staff for review and that they should receive a response within 2 business days.

## 2022-04-02 ENCOUNTER — Telehealth: Payer: Self-pay | Admitting: *Deleted

## 2022-04-02 NOTE — Telephone Encounter (Signed)
FYI: TC from [place] PT w/ [facility] Seeing pt today - pt has not been feeling well, has had nausea & diarrhea, this is a little better but he is still very weak. She was able to get him to sit on the side of the bed today. His Bp was 132/94 which they have to report d/t the diastolic reading. Pt using inhaler more, encouraged to move around more and to call the office to be seen, he will talk w/ his daughter about that.

## 2022-04-04 ENCOUNTER — Telehealth: Payer: Self-pay | Admitting: Family Medicine

## 2022-04-04 NOTE — Telephone Encounter (Signed)
Thank you. I agree with ER recommendation.

## 2022-06-19 ENCOUNTER — Ambulatory Visit: Payer: Medicare Other | Admitting: Neurology

## 2022-06-20 ENCOUNTER — Other Ambulatory Visit: Payer: Self-pay | Admitting: Neurology

## 2022-06-20 MED ORDER — PREGABALIN 150 MG PO CAPS: 150.0000 mg | ORAL_CAPSULE | Freq: Two times a day (BID) | ORAL | 3 refills | Status: DC

## 2022-06-20 NOTE — Telephone Encounter (Signed)
Honey is calling from [facility] requesting refill on pregabalin (LYRICA) 150 MG capsule. Refill should be sent to [place]. F) [phone]

## 2022-06-20 NOTE — Telephone Encounter (Signed)
Refill will be sent to MD to review and send for the patient to the pharmacy requested

## 2022-06-24 ENCOUNTER — Ambulatory Visit (INDEPENDENT_AMBULATORY_CARE_PROVIDER_SITE_OTHER): Payer: 59 | Admitting: Family Medicine

## 2022-06-24 ENCOUNTER — Other Ambulatory Visit: Payer: Self-pay | Admitting: Family Medicine

## 2022-06-24 ENCOUNTER — Encounter: Payer: Self-pay | Admitting: Family Medicine

## 2022-06-24 VITALS — BP 110/79 | HR 92 | Temp 97.5°F

## 2022-06-24 DIAGNOSIS — L03012 Cellulitis of left finger: Secondary | ICD-10-CM | POA: Diagnosis not present

## 2022-06-24 DIAGNOSIS — I10 Essential (primary) hypertension: Secondary | ICD-10-CM

## 2022-06-24 DIAGNOSIS — K219 Gastro-esophageal reflux disease without esophagitis: Secondary | ICD-10-CM

## 2022-06-24 DIAGNOSIS — E785 Hyperlipidemia, unspecified: Secondary | ICD-10-CM

## 2022-06-24 MED ORDER — DOXYCYCLINE HYCLATE 100 MG PO TABS: 100.0000 mg | ORAL_TABLET | Freq: Two times a day (BID) | ORAL | 0 refills | Status: AC

## 2022-06-24 NOTE — Progress Notes (Signed)
   Acute Office Visit  Subjective:     Patient ID: Edwin Snyder, male    DOB: June 22, 1955, 67 y.o.   MRN: [id number]  Chief Complaint  Patient presents with   Weight Loss   Insect Bite    Spider bite Thursday     HPI Patient is in today for a spider bit. He reports that he saw a small black and brown spider bite his thumb 4 days ago. He reports swelling and tenderness to the left thumb by the nail bed. He had a blister that he popped yesterday. There has been purulent drainage from the thumb. Denies fever, numbness, tingling.   He is interested in wegovy for weight loss. He is unable to exercise due to mobility issues. He has been referred to nutrition in the past but reports that he gains weight trying to eat what they recommend. He has difficulty with cooking meals but does have an aid that comes in the cook for him.    ROS As per HPI.      Objective:    BP 110/79   Pulse 92   Temp (!) 97.5 F (36.4 C) (Temporal)   SpO2 95%    Physical Exam Vitals and nursing note reviewed.  Constitutional:      General: He is not in acute distress.    Appearance: He is obese. He is not ill-appearing, toxic-appearing or diaphoretic.  Cardiovascular:     Rate and Rhythm: Normal rate and regular rhythm.     Heart sounds: Normal heart sounds. No murmur heard. Pulmonary:     Effort: Pulmonary effort is normal. No respiratory distress.     Breath sounds: Normal breath sounds.  Musculoskeletal:     Comments: Left distal thumb: erythema with skin breakdown and purulent drainage surround lower lateral nail bed. Generalized swelling to thumb. No warmth. Full ROM. Sensation intact. Brisk cap refill.   Skin:    General: Skin is warm and dry.  Neurological:     Mental Status: He is alert and oriented to person, place, and time. Mental status is at baseline.     Gait: Gait abnormal (arrives in motorized scooter).  Psychiatric:        Mood and Affect: Mood normal.        Behavior: Behavior  normal.     No results found for any visits on 06/24/22.      Assessment & Plan:   Kayveon was seen today for weight loss and insect bite.  Diagnoses and all orders for this visit:  Cellulitis of finger of left hand Doxycyline as below. Discussed home wound care and return precautions.  -     doxycycline (VIBRA-TABS) 100 MG tablet; Take 1 tablet (100 mg total) by mouth 2 (two) times daily for 7 days. 1 po bid  Morbid obesity ([place]) Discussed that medicare does not cover obesity medications. He declined referral today. Discussed diet.   Primary hypertension BP well controlled.   Return for schedule chronic follow up.  The patient indicates understanding of these issues and agrees with the plan.   Gwenlyn Perking, FNP

## 2022-06-27 ENCOUNTER — Other Ambulatory Visit: Payer: Self-pay | Admitting: Family Medicine

## 2022-06-27 DIAGNOSIS — E785 Hyperlipidemia, unspecified: Secondary | ICD-10-CM

## 2022-07-10 ENCOUNTER — Other Ambulatory Visit: Payer: Self-pay

## 2022-07-10 DIAGNOSIS — M25562 Pain in left knee: Secondary | ICD-10-CM

## 2022-07-11 MED ORDER — DULOXETINE HCL 30 MG PO CPEP: 30.0000 mg | ORAL_CAPSULE | Freq: Every day | ORAL | 0 refills | Status: DC

## 2022-07-12 NOTE — Telephone Encounter (Signed)
Patient daughter Glenard Haring has been informed. That a follow up is needed for future refills.

## 2022-07-15 ENCOUNTER — Telehealth: Payer: Self-pay | Admitting: *Deleted

## 2022-07-15 NOTE — Telephone Encounter (Signed)
[facility] medication details fax for provider YS to sign on Duloxetine 30 mg.

## 2022-07-25 ENCOUNTER — Ambulatory Visit: Payer: 59 | Admitting: Family Medicine

## 2022-08-02 ENCOUNTER — Ambulatory Visit: Payer: 59 | Admitting: Family Medicine

## 2022-08-12 ENCOUNTER — Telehealth: Payer: Self-pay | Admitting: Family Medicine

## 2022-08-12 NOTE — Telephone Encounter (Signed)
**  [facility] Medicine After Hours/ Emergency Line Call**  Patient: Edwin Snyder .  PCP: Gabriel Earing, FNP  [facility] calls to report that Edwin Snyder was found deceased in his home by his niece around 10:50a.  Last known well was around midnight.  No evidence of foul play.  Medhx significant for HTN, CKD3, Orthostasis, electrolyte imbalance and mobility issues.  Suspect cardiac arrest given complicated medical history.  Ok to release patient's remains.  Will cc PCP to complete death certificate.  Neah Sporrer M. Nadine Counts, DO

## 2022-08-13 ENCOUNTER — Telehealth: Payer: Self-pay | Admitting: Neurology

## 2022-08-13 NOTE — Telephone Encounter (Signed)
Pt's daughter has called to report that pt has passed.

## 2022-08-14 NOTE — Telephone Encounter (Signed)
Thank you, will complete asap.

## 2022-08-16 ENCOUNTER — Ambulatory Visit: Payer: 59 | Admitting: Family Medicine

## 2022-08-28 DEATH — deceased

## 2022-09-19 ENCOUNTER — Ambulatory Visit: Payer: Medicare Other | Admitting: Neurology

## 2023-09-26 IMAGING — DX DG CHEST 1V PORT
1 series · 1 of 1 positions shown · non-contrast
Comparison: 01/08/2021

CLINICAL DATA: Weakness

EXAM:
PORTABLE CHEST 1 VIEW

[chest ap]
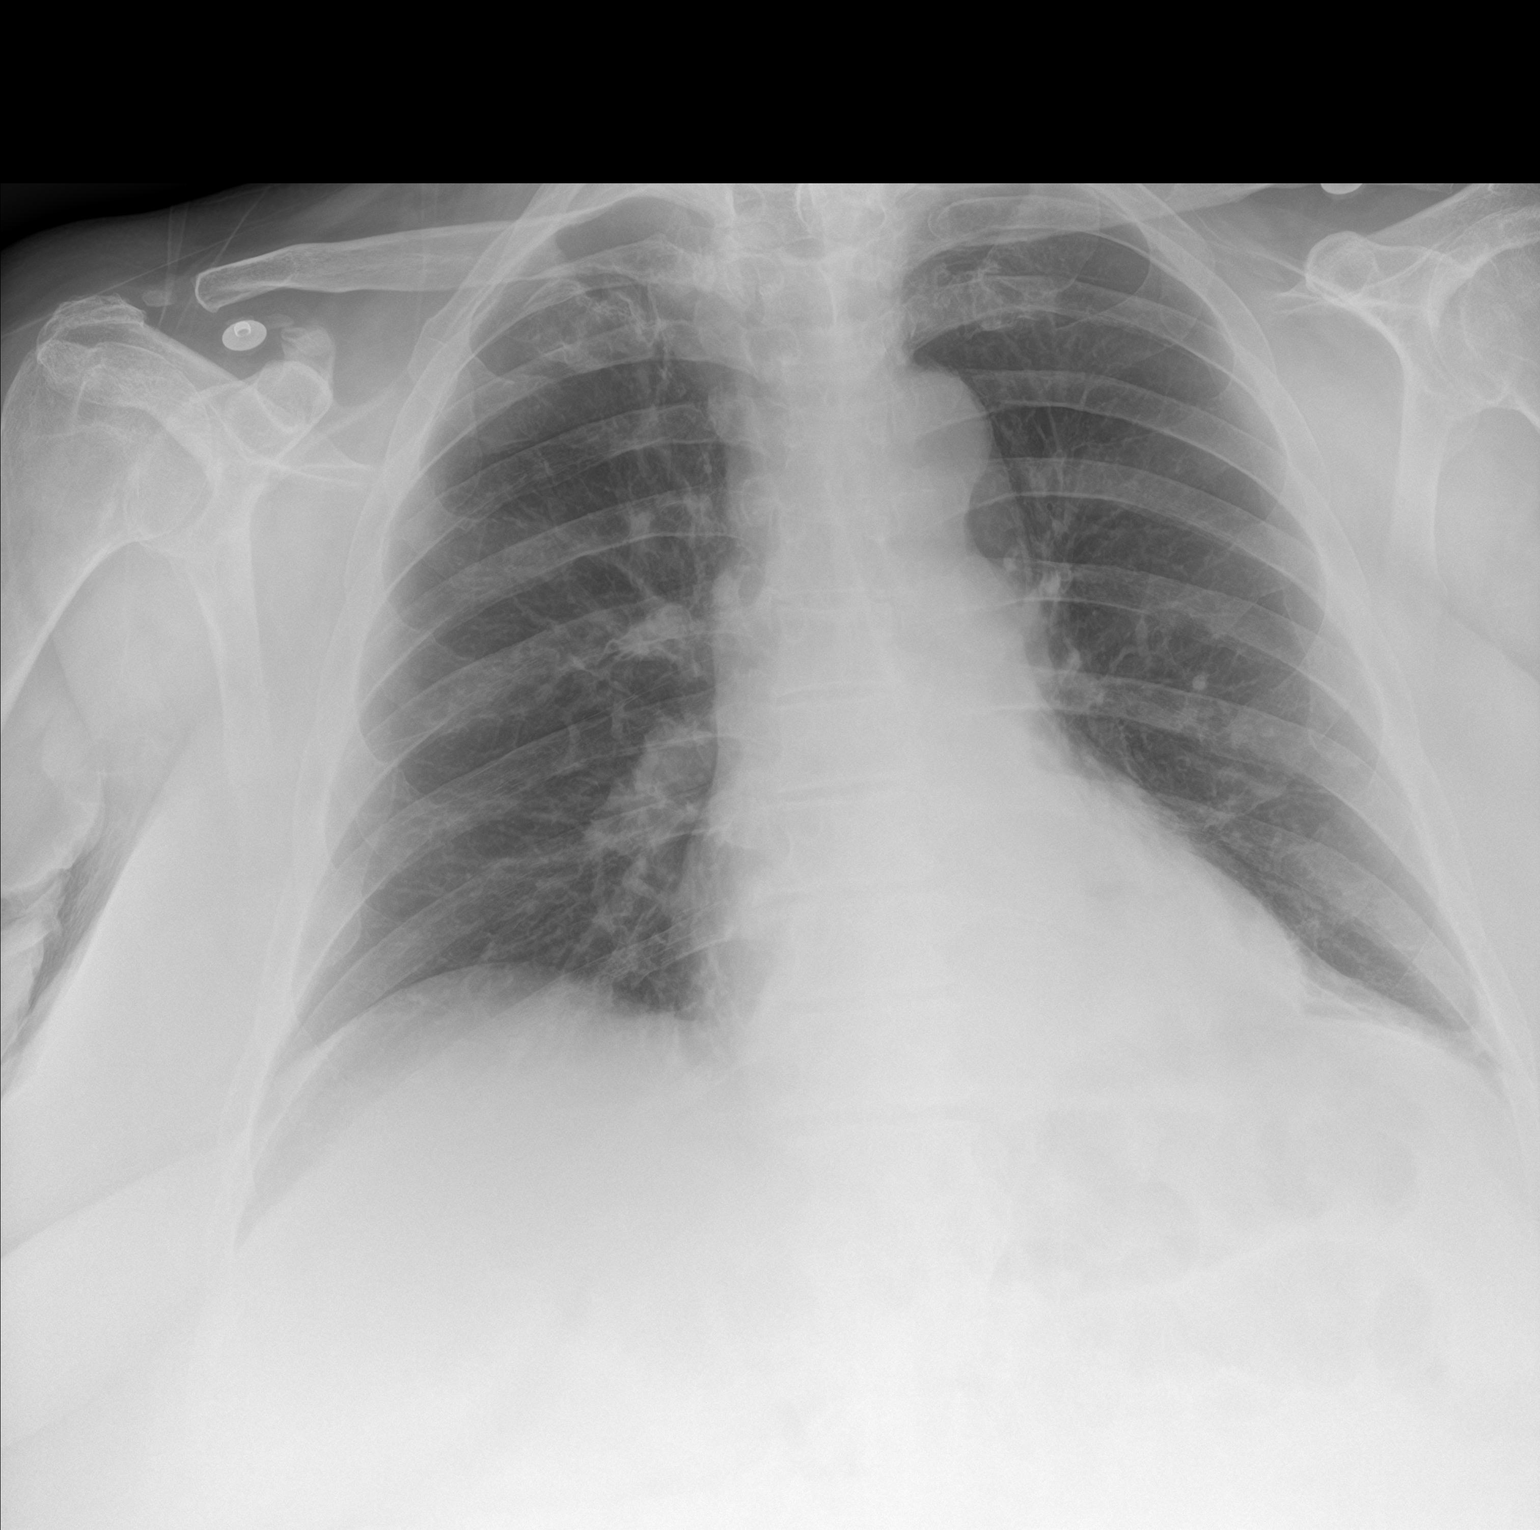

[1 of 1 positions shown; findings below may reference images not displayed]

FINDINGS: Left base opacity, likely atelectasis. Right lung clear. Heart is
normal size. No effusions or acute bony abnormality.
IMPRESSION: Left base atelectasis.

## 2023-09-26 IMAGING — CT CT HEAD W/O CM
3 series · 15 of 47 positions shown, 18 images · non-contrast
Comparison: Head CT dated 10/25/2020

CLINICAL DATA: Bilateral weakness, falls. Bilateral leg pain and
numbness.



[Series 2: head w o · axial · 0.46mm/px · z∈[+191,+326]mm · 9 of 33 slices shown, 12 images]
[im 3/33  brain]
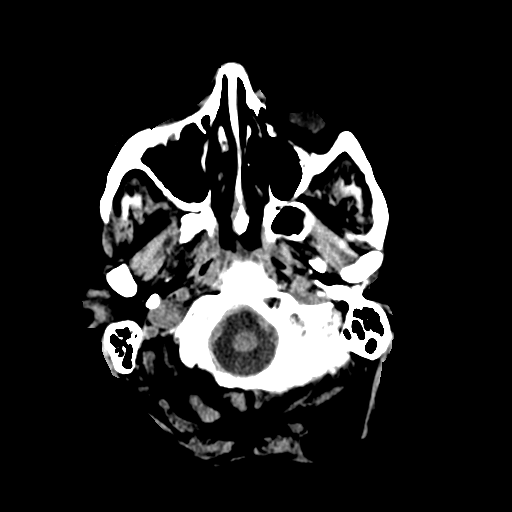
[im 3/33  bone]
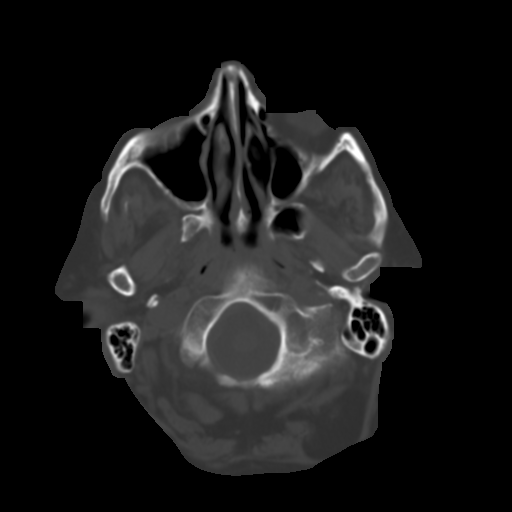
[im 6/33  brain]
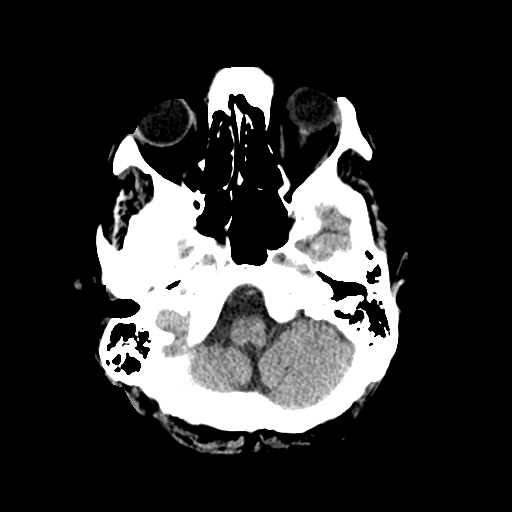
[im 9/33  brain]
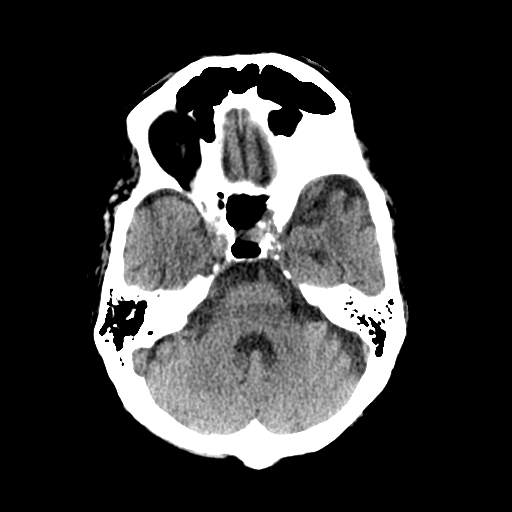
[im 13/33  brain]
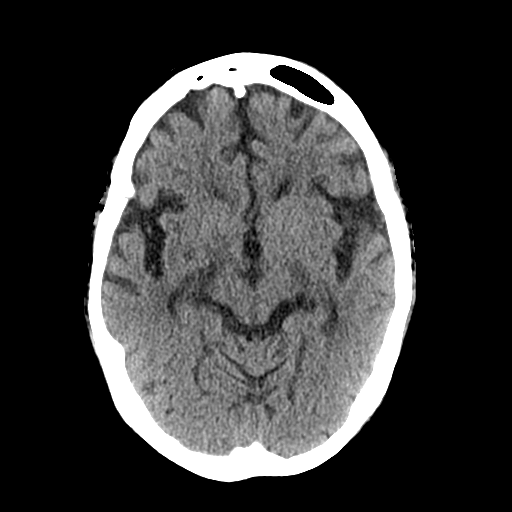
[im 17/33  brain]
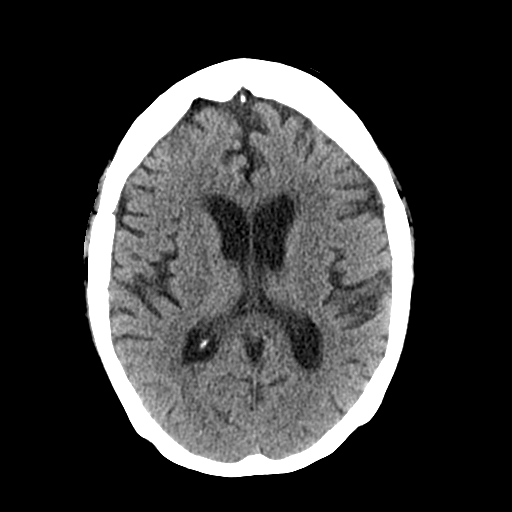
[im 17/33  bone]
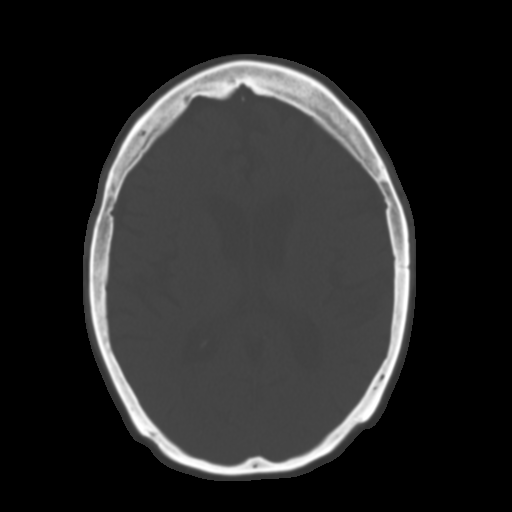
[im 20/33  brain]
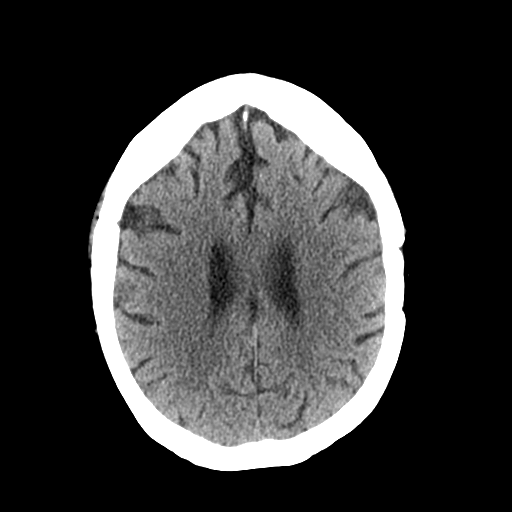
[im 24/33  brain]
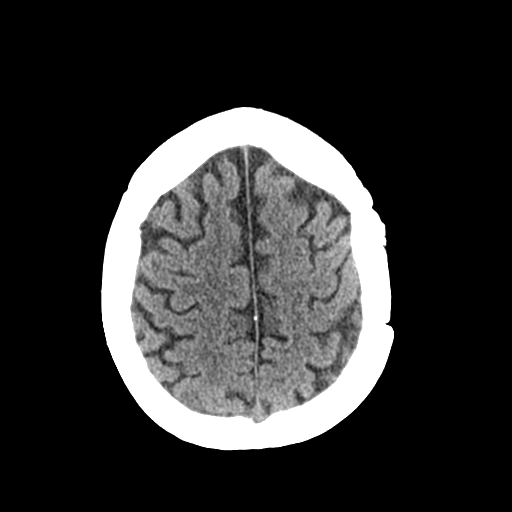
[im 27/33  brain]
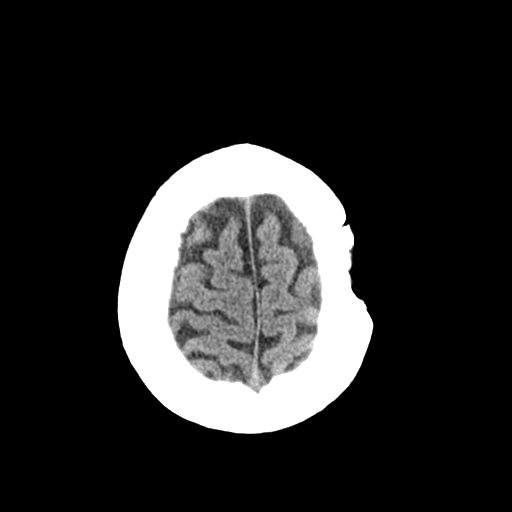
[im 30/33  brain]
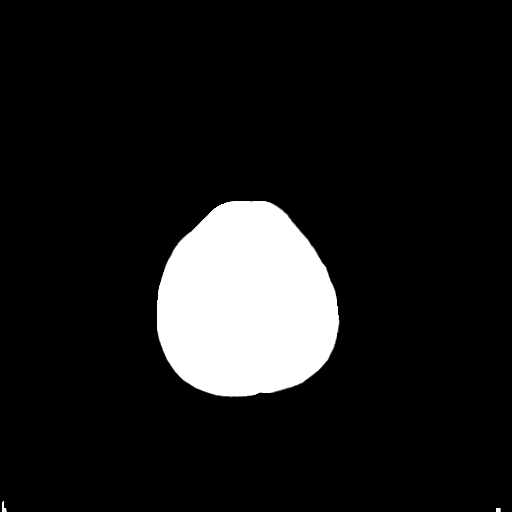
[im 30/33  bone]
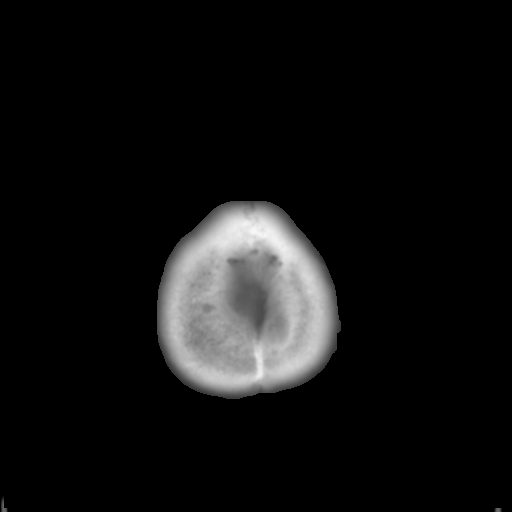

[Series 4: coronal soft · coronal · 0.37mm/px · 3 of 73 slices shown]
[im 25/73  brain]
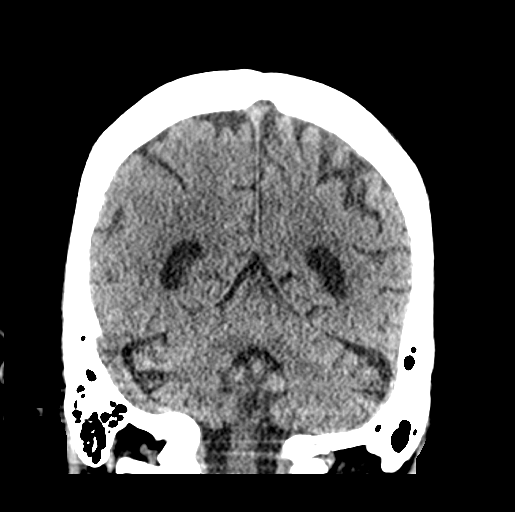
[im 33/73  brain]
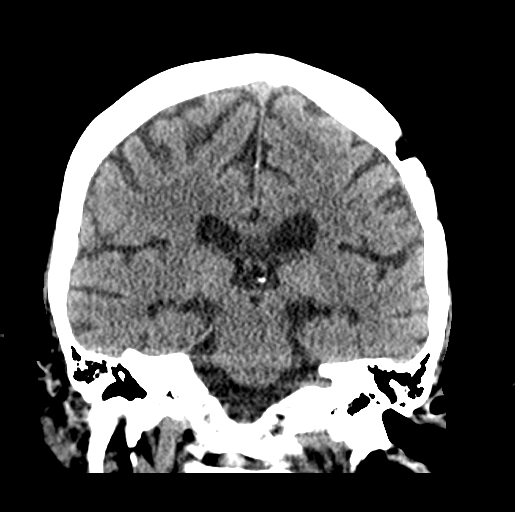
[im 41/73  brain]
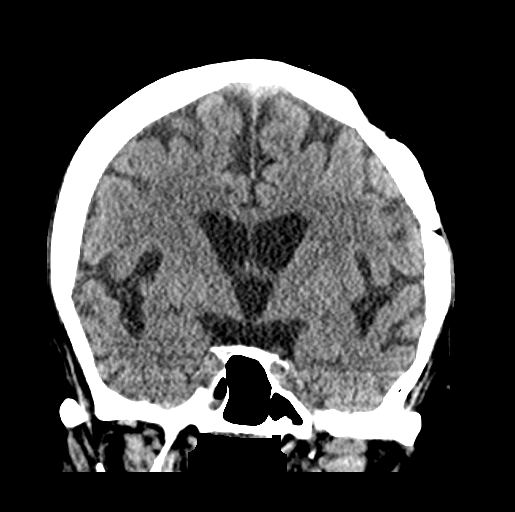

[Series 5: sagittal soft · sagittal · 0.36mm/px · 3 of 64 slices shown]
[im 22/64  brain]
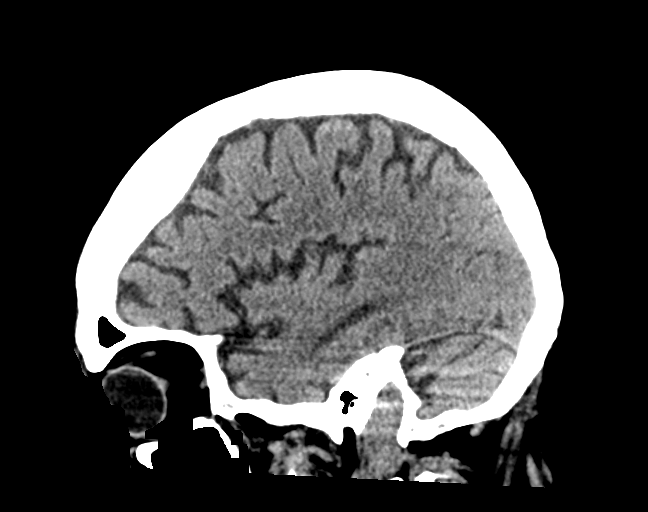
[im 32/64  brain]
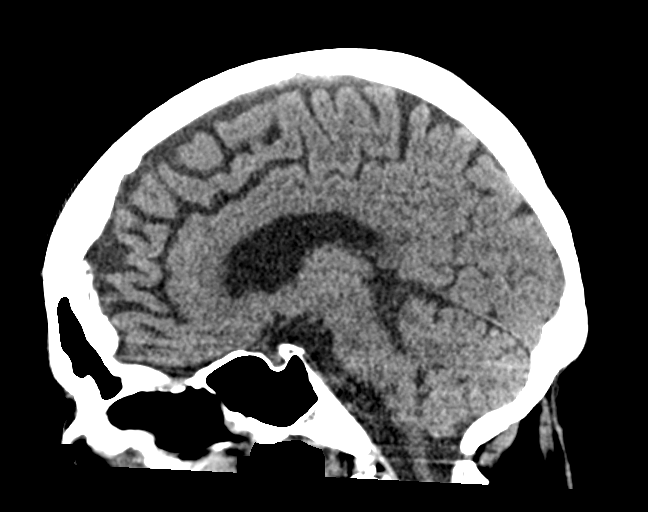
[im 43/64  brain]
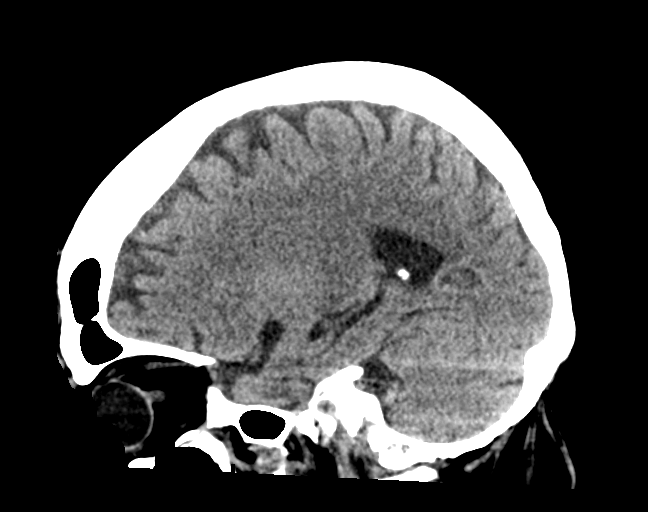

[15 of 47 positions shown; findings below may reference images not displayed]

FINDINGS: Brain: Generalized parenchymal volume loss with commensurate
dilatation of the ventricles and sulci. Mild chronic small vessel
ischemic changes within the deep periventricular white matter
regions bilaterally.

No mass, hemorrhage, edema or other evidence of acute parenchymal
abnormality. No extra-axial hemorrhage.

Vascular: Chronic calcified atherosclerotic changes of the large
vessels at the skull base. No unexpected hyperdense vessel.

Skull: No acute findings. Surgical changes of a previous LEFT
frontoparietal craniotomy.

Sinuses/Orbits: Mild mucosal thickening within the ethmoid air cells
and maxillary sinuses. Periorbital and retro-orbital soft tissues
are unremarkable.

Other: None.
IMPRESSION: 1. No acute findings. No intracranial mass, hemorrhage or edema.
2. Mild chronic small vessel ischemic changes in the deep
periventricular white matter regions.
3. Surgical changes of a previous LEFT frontoparietal craniotomy.
4. Mild paranasal sinus disease.
